# Patient Record
Sex: Female | Born: 1950
Health system: Southern US, Community
[De-identification: ages and names within clinical notes are randomized; demographics above are authoritative.]

## PROBLEM LIST (undated history)

## (undated) DIAGNOSIS — M1990 Unspecified osteoarthritis, unspecified site: Secondary | ICD-10-CM

## (undated) DIAGNOSIS — I10 Essential (primary) hypertension: Secondary | ICD-10-CM

## (undated) DIAGNOSIS — M8580 Other specified disorders of bone density and structure, unspecified site: Secondary | ICD-10-CM

## (undated) DIAGNOSIS — K219 Gastro-esophageal reflux disease without esophagitis: Secondary | ICD-10-CM

## (undated) HISTORY — DX: Other specified disorders of bone density and structure, unspecified site: M85.80

## (undated) HISTORY — PX: OTHER SURGICAL HISTORY: SHX169

## (undated) HISTORY — DX: Gastro-esophageal reflux disease without esophagitis: K21.9

## (undated) HISTORY — PX: BACK SURGERY: SHX140

---

## 1991-04-10 HISTORY — PX: ABDOMINAL HYSTERECTOMY: SHX81

## 1998-05-31 ENCOUNTER — Encounter: Payer: Self-pay | Admitting: Emergency Medicine

## 1998-05-31 ENCOUNTER — Emergency Department (HOSPITAL_COMMUNITY): Admission: EM | Admit: 1998-05-31 | Discharge: 1998-05-31 | Payer: Self-pay | Admitting: Emergency Medicine

## 1998-12-23 ENCOUNTER — Encounter: Payer: Self-pay | Admitting: Neurosurgery

## 1998-12-27 ENCOUNTER — Encounter: Payer: Self-pay | Admitting: Neurosurgery

## 1998-12-27 ENCOUNTER — Observation Stay (HOSPITAL_COMMUNITY): Admission: RE | Admit: 1998-12-27 | Discharge: 1998-12-28 | Payer: Self-pay | Admitting: Neurosurgery

## 1999-06-26 ENCOUNTER — Encounter: Payer: Self-pay | Admitting: *Deleted

## 1999-06-26 ENCOUNTER — Ambulatory Visit (HOSPITAL_COMMUNITY): Admission: RE | Admit: 1999-06-26 | Discharge: 1999-06-26 | Payer: Self-pay | Admitting: *Deleted

## 1999-10-25 ENCOUNTER — Other Ambulatory Visit: Admission: RE | Admit: 1999-10-25 | Discharge: 1999-10-25 | Payer: Self-pay | Admitting: Obstetrics and Gynecology

## 2000-04-09 HISTORY — PX: BACK SURGERY: SHX140

## 2000-11-26 ENCOUNTER — Other Ambulatory Visit: Admission: RE | Admit: 2000-11-26 | Discharge: 2000-11-26 | Payer: Self-pay | Admitting: Obstetrics and Gynecology

## 2001-10-24 ENCOUNTER — Ambulatory Visit (HOSPITAL_COMMUNITY): Admission: RE | Admit: 2001-10-24 | Discharge: 2001-10-24 | Payer: Self-pay | Admitting: *Deleted

## 2001-10-24 ENCOUNTER — Encounter: Payer: Self-pay | Admitting: *Deleted

## 2001-10-26 ENCOUNTER — Ambulatory Visit (HOSPITAL_COMMUNITY): Admission: RE | Admit: 2001-10-26 | Discharge: 2001-10-26 | Payer: Self-pay | Admitting: *Deleted

## 2001-10-26 ENCOUNTER — Encounter: Payer: Self-pay | Admitting: *Deleted

## 2003-08-25 ENCOUNTER — Emergency Department (HOSPITAL_COMMUNITY): Admission: EM | Admit: 2003-08-25 | Discharge: 2003-08-25 | Payer: Self-pay | Admitting: Emergency Medicine

## 2003-08-30 ENCOUNTER — Ambulatory Visit (HOSPITAL_BASED_OUTPATIENT_CLINIC_OR_DEPARTMENT_OTHER): Admission: RE | Admit: 2003-08-30 | Discharge: 2003-08-30 | Payer: Self-pay | Admitting: *Deleted

## 2003-08-30 ENCOUNTER — Ambulatory Visit (HOSPITAL_COMMUNITY): Admission: RE | Admit: 2003-08-30 | Discharge: 2003-08-30 | Payer: Self-pay | Admitting: *Deleted

## 2007-05-16 ENCOUNTER — Ambulatory Visit: Payer: Self-pay | Admitting: Gastroenterology

## 2007-05-30 ENCOUNTER — Encounter: Payer: Self-pay | Admitting: Gastroenterology

## 2007-05-30 ENCOUNTER — Ambulatory Visit: Payer: Self-pay | Admitting: Gastroenterology

## 2007-05-30 LAB — HM COLONOSCOPY

## 2009-10-28 ENCOUNTER — Encounter: Admission: RE | Admit: 2009-10-28 | Discharge: 2009-10-28 | Payer: Self-pay | Admitting: Internal Medicine

## 2010-08-25 NOTE — Op Note (Signed)
NAME:  Candace Bell, HIPOLITO NO.:  [id number]   MEDICAL RECORD NO.:  [id number]                   PATIENT TYPE:  AMB   LOCATION:  DSC                                  FACILITY:  [facility]   PHYSICIAN:  Lowell Bouton, M.D.      DATE OF BIRTH:  [birth year]   DATE OF PROCEDURE:  08/30/2003  DATE OF DISCHARGE:                                 OPERATIVE REPORT   PREOPERATIVE DIAGNOSIS:  Fracture of right distal radius and ulna.   POSTOPERATIVE DIAGNOSIS:  Fracture of right distal radius and ulna.   OPERATION PERFORMED:  Open reduction internal fixation of right wrist.   SURGEON:  Lowell Bouton, M.D.   ANESTHESIA:  General.   INDICATIONS FOR PROCEDURE:  The patient has a comminuted distal radius  fracture with dorsal angulation.   DESCRIPTION OF PROCEDURE:  Under general anesthesia with a tourniquet on the  right arm, the right hand was prepped and draped in the usual sterile  fashion.  After exsanguinating the limb, the tourniquet was inflated to 250  mmHg.  A longitudinal incision was made along the FCR tendon, volarly on the  right wrist.  Sharp dissection was carried down through the subcutaneous  tissues and bleeding points were coagulated.  Sharp dissection was carried  into the FCR tendon sheath and the tendon was retracted ulnarly.  The floor  of the sheath was opened with the scissors and blunt dissection was carried  down to the pronator quadratus.  This was divided longitudinally and a Market researcher was used to elevate the periosteum of the distal radius.  A  Weitlaner retractor was inserted for retraction. The fracture site was then  identified.  The fingers had been placed in finger trap traction using the  index and middle fingers with 10 pounds applied across the end of the table  prior to making the incision. The fracture was then reduced with a Market researcher and the Stryker distal radius system was used.  A small distal  radius plate was then applied volarly and the sliding screw was inserted  using a 2.7 screw.  This was placed in the sliding hole on the plate and x-  ray showed good position.  The traction was then removed and an unthreaded  peg was inserted in one of the distal holes using a 2.0 mm peg.  X-ray  showed good alignment of the fracture.  Two locking screws were placed  proximally using 2.7 mm locking screw and one more nonlocking screw was  inserted proximally.  The remaining pegs were inserted distally at various  angles to support the distal fragment.  One threaded peg was used and the  rest were 2.0 nonthreaded.  X-ray showed good position of the fracture.  The  wound was then irrigated with saline.  The pronator quadratus was repaired  with a 4-0 Vicryl, subcutaneous tissue was closed with a 4-0 Vicryl over a  vessel loop  drain.  The skin was closed with a 3-0 subcuticular Prolene.  Steri-Strips were applied, followed by a sterile dressing.  0.5% Marcaine  was inserted in the wound for pain control.  The patient was placed in a  volar wrist splint.  She went to the recovery room awake and stable in good  condition.                                               Lowell Bouton, M.D.    EMM/MEDQ  D:  08/30/2003  T:  08/31/2003  Job:  [id number]

## 2012-01-10 ENCOUNTER — Emergency Department (HOSPITAL_COMMUNITY)
Admission: EM | Admit: 2012-01-10 | Discharge: 2012-01-10 | Disposition: A | Discharge status: Home / Self Care | Payer: BC Managed Care – PPO | Attending: Emergency Medicine | Admitting: Emergency Medicine

## 2012-01-10 ENCOUNTER — Encounter (HOSPITAL_COMMUNITY): Payer: Self-pay

## 2012-01-10 DIAGNOSIS — R599 Enlarged lymph nodes, unspecified: Secondary | ICD-10-CM | POA: Insufficient documentation

## 2012-01-10 DIAGNOSIS — R509 Fever, unspecified: Secondary | ICD-10-CM | POA: Insufficient documentation

## 2012-01-10 DIAGNOSIS — J029 Acute pharyngitis, unspecified: Secondary | ICD-10-CM | POA: Insufficient documentation

## 2012-01-10 DIAGNOSIS — I10 Essential (primary) hypertension: Secondary | ICD-10-CM | POA: Insufficient documentation

## 2012-01-10 DIAGNOSIS — R51 Headache: Secondary | ICD-10-CM | POA: Insufficient documentation

## 2012-01-10 HISTORY — DX: Unspecified osteoarthritis, unspecified site: M19.90

## 2012-01-10 HISTORY — DX: Essential (primary) hypertension: I10

## 2012-01-10 LAB — CBC WITH DIFFERENTIAL/PLATELET
Basophils Absolute: 0 10*3/uL (ref 0.0–0.1)
Eosinophils Absolute: 0 10*3/uL (ref 0.0–0.7)
Eosinophils Relative: 0 % (ref 0–5)
MCH: 28.5 pg (ref 26.0–34.0)
MCV: 81.6 fL (ref 78.0–100.0)
Platelets: 186 10*3/uL (ref 150–400)
RDW: 13.7 % (ref 11.5–15.5)
WBC: 4.1 10*3/uL (ref 4.0–10.5)

## 2012-01-10 LAB — URINALYSIS, ROUTINE W REFLEX MICROSCOPIC
Nitrite: NEGATIVE
Protein, ur: NEGATIVE mg/dL

## 2012-01-10 MED ORDER — AZITHROMYCIN 250 MG PO TABS: ORAL_TABLET | ORAL | Status: DC

## 2012-01-10 MED ORDER — AZITHROMYCIN 250 MG PO TABS
500.0000 mg | ORAL_TABLET | Freq: Once | ORAL | Status: AC
  Administered 2012-01-10: 500 mg via ORAL
  Filled 2012-01-10: qty 2

## 2012-01-10 MED ORDER — HYDROCODONE-ACETAMINOPHEN 7.5-500 MG/15ML PO SOLN: ORAL | Status: DC

## 2012-01-10 MED ORDER — IBUPROFEN 100 MG/5ML PO SUSP
400.0000 mg | Freq: Once | ORAL | Status: AC
  Administered 2012-01-10: 400 mg via ORAL
  Filled 2012-01-10: qty 20

## 2012-01-10 NOTE — ED Provider Notes (Addendum)
History     CSN: [id number]  Arrival date & time 01/10/12  1415   First MD Initiated Contact with Patient 01/10/12 1538      Chief Complaint  Patient presents with  . Fever    (Consider location/radiation/quality/duration/timing/severity/associated sxs/prior treatment) Patient is a 61 y.o. female presenting with fever. The history is provided by the patient and a relative.  Fever Primary symptoms of the febrile illness include fever and headaches. Primary symptoms do not include cough, shortness of breath, abdominal pain, nausea, vomiting, dysuria, altered mental status or rash.   stick 56-year-old, female, with a history of arthritis, and hypertension, presents to emergency department complaining of fever, sore throat, and headache for the past several days.  She has also had malaise, weakness.  She was seen by her physician 2 days ago.  Rapid strep test was performed.  It was negative.  The patient.  Denies nausea, vomiting, rash, neck pain.  She denies cough, or shortness of breath.  She denies dysuria, or hematuria.  Past Medical History  Diagnosis Date  . Arthritis   . Hypertension     Past Surgical History  Procedure Date  . Back surgery     No family history on file.  History  Substance Use Topics  . Smoking status: Not on file  . Smokeless tobacco: Not on file  . Alcohol Use: No    OB History    Grav Para Term Preterm Abortions TAB SAB Ect Mult Living                  Review of Systems  Constitutional: Positive for fever and chills. Negative for diaphoresis.  HENT: Negative for neck pain.   Respiratory: Negative for cough and shortness of breath.   Cardiovascular: Negative for chest pain.  Gastrointestinal: Negative for nausea, vomiting and abdominal pain.  Genitourinary: Negative for dysuria.  Skin: Negative for rash.  Neurological: Positive for headaches.  Psychiatric/Behavioral: Negative for confusion and altered mental status.    Allergies    Amoxicillin and Penicillins  Home Medications  No current outpatient prescriptions on file.  BP 120/68  Pulse 112  Temp 102.2 F (39 C) (Oral)  Resp 24  SpO2 100%  Physical Exam  Nursing note and vitals reviewed. Constitutional: She is oriented to person, place, and time. She appears well-developed and well-nourished. No distress.  HENT:  Head: Normocephalic and atraumatic.  Mouth/Throat: Oropharyngeal exudate present.       OP is erythematous with purulence on the left tonsillar pillar  Eyes: Conjunctivae normal and EOM are normal.  Neck: Normal range of motion. Neck supple.  Pulmonary/Chest: Effort normal and breath sounds normal. No respiratory distress.  Abdominal: Soft. Bowel sounds are normal. She exhibits no distension.  Musculoskeletal: Normal range of motion.  Lymphadenopathy:    She has cervical adenopathy.  Neurological: She is alert and oriented to person, place, and time. No cranial nerve deficit.  Skin: Skin is warm and dry.  Psychiatric: She has a normal mood and affect. Judgment and thought content normal.    ED Course  Procedures (including critical care time) fever, sore throat, oral, erythema, and 30 lymphs, and lymphadenopathy.  No cough.  Suspect strep pharyngitis.  We'll treat her fever and pain with ibuprofen and perform laboratory testing, for further evaluation   Labs Reviewed  CBC WITH DIFFERENTIAL  URINALYSIS, ROUTINE W REFLEX MICROSCOPIC  RAPID STREP SCREEN   No results found.   No diagnosis found.  Temp and pain improved.  Not toxic.  MDM  Exudative pharyngitis fever        Cheri Guppy, MD 01/10/12 1552  Cheri Guppy, MD 01/10/12 1941  Cheri Guppy, MD 01/11/12 0002

## 2012-01-10 NOTE — Progress Notes (Signed)
Pt and daughters states pt will be seeing Lelon Perla, DO in a few weeks

## 2012-01-10 NOTE — ED Notes (Signed)
Pt reports sores to mouth starting Sunday and starting "feeling bad" on Monday and started running a fever today. Reports not being able to eat well and has lost 8 lbs since Sunday. NAD noted at this time.

## 2012-01-10 NOTE — ED Notes (Signed)
Pt reports having sores

## 2012-01-10 NOTE — ED Notes (Signed)
Pt present to Ed with C/o fever and "feeling bad."  Pt went to PCP, Dr Clarene Duke at [facility] on Monday.  Pt was tested for strep, still awaiting for results.  Pt was tested for Herpes in mouth by dr Zachery Dauer in July and test came back negative.  Pt current symptoms are sores in mouth, fever, fatigue and dizziness.  Pt denies drainage to sores.  Pt reports feeling sores in throat now.  Per pt, sores was not in throat when the flare up occurred in July.

## 2012-01-12 ENCOUNTER — Encounter (HOSPITAL_COMMUNITY): Payer: Self-pay | Admitting: Emergency Medicine

## 2012-01-12 ENCOUNTER — Emergency Department (HOSPITAL_COMMUNITY)
Admission: EM | Admit: 2012-01-12 | Discharge: 2012-01-12 | Disposition: A | Discharge status: Home / Self Care | Payer: BC Managed Care – PPO | Attending: Emergency Medicine | Admitting: Emergency Medicine

## 2012-01-12 DIAGNOSIS — E876 Hypokalemia: Secondary | ICD-10-CM

## 2012-01-12 DIAGNOSIS — N289 Disorder of kidney and ureter, unspecified: Secondary | ICD-10-CM | POA: Insufficient documentation

## 2012-01-12 DIAGNOSIS — E860 Dehydration: Secondary | ICD-10-CM

## 2012-01-12 DIAGNOSIS — M129 Arthropathy, unspecified: Secondary | ICD-10-CM | POA: Insufficient documentation

## 2012-01-12 DIAGNOSIS — I10 Essential (primary) hypertension: Secondary | ICD-10-CM | POA: Insufficient documentation

## 2012-01-12 DIAGNOSIS — Z880 Allergy status to penicillin: Secondary | ICD-10-CM | POA: Insufficient documentation

## 2012-01-12 DIAGNOSIS — R0602 Shortness of breath: Secondary | ICD-10-CM | POA: Insufficient documentation

## 2012-01-12 DIAGNOSIS — B085 Enteroviral vesicular pharyngitis: Secondary | ICD-10-CM

## 2012-01-12 LAB — CBC WITH DIFFERENTIAL/PLATELET
Basophils Relative: 2 % — ABNORMAL HIGH (ref 0–1)
Eosinophils Absolute: 0 10*3/uL (ref 0.0–0.7)
HCT: 41.6 % (ref 36.0–46.0)
Hemoglobin: 14.5 g/dL (ref 12.0–15.0)
MCH: 28.2 pg (ref 26.0–34.0)
MCHC: 34.9 g/dL (ref 30.0–36.0)
Monocytes Absolute: 0.2 10*3/uL (ref 0.1–1.0)
Monocytes Relative: 5 % (ref 3–12)
Neutro Abs: 3.2 10*3/uL (ref 1.7–7.7)

## 2012-01-12 LAB — COMPREHENSIVE METABOLIC PANEL
Albumin: 3.7 g/dL (ref 3.5–5.2)
BUN: 39 mg/dL — ABNORMAL HIGH (ref 6–23)
Chloride: 97 mEq/L (ref 96–112)
Creatinine, Ser: 1.67 mg/dL — ABNORMAL HIGH (ref 0.50–1.10)
GFR calc Af Amer: 37 mL/min — ABNORMAL LOW (ref >90)
Total Bilirubin: 0.5 mg/dL (ref 0.3–1.2)

## 2012-01-12 LAB — LACTIC ACID, PLASMA: Lactic Acid, Venous: 2.2 mmol/L (ref 0.5–2.2)

## 2012-01-12 MED ORDER — SODIUM CHLORIDE 0.9 % IV SOLN
INTRAVENOUS | Status: DC
  Administered 2012-01-12: 17:00:00 via INTRAVENOUS

## 2012-01-12 MED ORDER — POTASSIUM CHLORIDE CRYS ER 20 MEQ PO TBCR
40.0000 meq | EXTENDED_RELEASE_TABLET | Freq: Once | ORAL | Status: AC
  Administered 2012-01-12: 40 meq via ORAL
  Filled 2012-01-12: qty 2

## 2012-01-12 MED ORDER — HYDROMORPHONE HCL PF 1 MG/ML IJ SOLN
1.0000 mg | Freq: Once | INTRAMUSCULAR | Status: AC
  Administered 2012-01-12: 1 mg via INTRAVENOUS
  Filled 2012-01-12: qty 1

## 2012-01-12 MED ORDER — SODIUM CHLORIDE 0.9 % IV BOLUS (SEPSIS)
2000.0000 mL | Freq: Once | INTRAVENOUS | Status: AC
  Administered 2012-01-12: 1000 mL via INTRAVENOUS

## 2012-01-12 MED ORDER — SODIUM CHLORIDE 0.9 % IV BOLUS (SEPSIS)
500.0000 mL | Freq: Once | INTRAVENOUS | Status: AC
  Administered 2012-01-12: 500 mL via INTRAVENOUS

## 2012-01-12 MED ORDER — HYDROCODONE-ACETAMINOPHEN 5-325 MG PO TABS: 2.0000 | ORAL_TABLET | ORAL | Status: DC | PRN

## 2012-01-12 NOTE — ED Provider Notes (Signed)
History   This chart was scribed for Hurman Horn, MD by Melba Coon. The patient was seen in room TR06C/TR06C and the patient's care was started at 3:13PM.    CSN: [id number]  Arrival date & time 01/12/12  1317   None     Chief Complaint  Patient presents with  . Sore Throat  . Shortness of Breath    (Consider location/radiation/quality/duration/timing/severity/associated sxs/prior treatment) The history is provided by the patient. No language interpreter was used.   Candace Bell is a 61 y.o. female who presents to the Emergency Department complaining of persistent, moderate to severe sore throat and shortness of breath with an onset 6 days ago. She has been seen at [facility] and [facility] and all labs have been negative. Family also reports pt had a fever of 102 for the past 2 nights. Decreased appetite and fluid intake. Reports losing 10 lbs in the last month. Reports diarrhea, generalized weakness, chills, sores in her mouth (had them in July but came back recently), and fatigue. Denies cough, HA, neck pain, rash, back pain, CP, abd pain, nausea, vomit, dysuria, or extremity pain, edema, weakness, numbness, or tingling. No other pertinent medical symptoms.   Past Medical History  Diagnosis Date  . Arthritis   . Hypertension     Past Surgical History  Procedure Date  . Back surgery   . Abdominal hysterectomy     No family history on file.  History  Substance Use Topics  . Smoking status: Never Smoker   . Smokeless tobacco: Not on file  . Alcohol Use: No    OB History    Grav Para Term Preterm Abortions TAB SAB Ect Mult Living                  Review of Systems 10 Systems reviewed and all are negative for acute change except as noted in the HPI.   Allergies  Amoxicillin; Other; and Penicillins  Home Medications   Current Outpatient Rx  Name Route Sig Dispense Refill  . AZITHROMYCIN 250 MG PO TABS Oral Take 250 mg by mouth daily. For 4  days Started 10/4    . IBUPROFEN 800 MG PO TABS Oral Take 800 mg by mouth every 8 (eight) hours as needed. For pain    . MELOXICAM 15 MG PO TABS Oral Take 15 mg by mouth daily.    Marland Kitchen PRESCRIPTION MEDICATION Oral Take 5 mLs by mouth every 8 (eight) hours as needed. For pain  Hydrocodone/acetaminophen solution 7.5-325mg /48ml    . TRIAMCINOLONE ACETONIDE 0.1 % MT PSTE dental Place 1 application onto teeth 2 (two) times daily. To tongue    . VALSARTAN-HYDROCHLOROTHIAZIDE 160-25 MG PO TABS Oral Take 1 tablet by mouth daily.    Marland Kitchen HYDROCODONE-ACETAMINOPHEN 5-325 MG PO TABS Oral Take 2 tablets by mouth every 4 (four) hours as needed for pain. 20 tablet 0    BP 95/66  Pulse 106  Temp 98.9 F (37.2 C) (Oral)  Resp 18  SpO2 99%  Physical Exam  Nursing note and vitals reviewed. Constitutional:       Awake, alert, nontoxic appearance with baseline speech for patient.  HENT:  Head: Normocephalic and atraumatic.  Mouth/Throat: No oropharyngeal exudate.       Dry mouth with superficial 1cm ulcers to the tongue, buccal mucosa, oropharynx and lips  Eyes: EOM are normal. Pupils are equal, round, and reactive to light. Right eye exhibits no discharge. Left eye exhibits no discharge.  Neck: Neck supple.  Cardiovascular: Regular rhythm.  Tachycardia present.   No murmur heard. Pulmonary/Chest: Effort normal and breath sounds normal. No stridor. No respiratory distress. She has no wheezes. She has no rales. She exhibits no tenderness.       Clear lung sounds bilaterally.  Abdominal: Soft. Bowel sounds are normal. She exhibits no mass. There is no tenderness. There is no rebound.  Musculoskeletal: She exhibits no tenderness.       Baseline ROM, moves extremities with no obvious new focal weakness.  Lymphadenopathy:    She has no cervical adenopathy.  Neurological:       Awake, alert, cooperative and aware of situation; motor strength bilaterally; sensation normal to light touch bilaterally; peripheral  visual fields full to confrontation; no facial asymmetry; tongue midline; major cranial nerves appear intact; no pronator drift, normal finger to nose bilaterally, baseline gait without new ataxia.  Skin: No rash noted.  Psychiatric: She has a normal mood and affect.    ED Course  Procedures (including critical care time)   COORDINATION OF CARE:  3:18PM - strep test was run at [facility] and was negative. Blood w/u will be ordered. IV fluids and pain meds will be given here at the ED and will be transferred to CDU. 5:45PM - recheck; Mrs Rechner condition is much improved and she is ready to go home.  She will be given norco for home, is advised to f/u with PCP, and is ready for d/c.   Labs Reviewed  CBC WITH DIFFERENTIAL - Abnormal; Notable for the following:    RBC 5.14 (*)     Basophils Relative 2 (*)     All other components within normal limits  COMPREHENSIVE METABOLIC PANEL - Abnormal; Notable for the following:    Sodium 133 (*)     Potassium 3.0 (*)     Glucose, Bld 168 (*)     BUN 39 (*)     Creatinine, Ser 1.67 (*)     AST 49 (*)     ALT 36 (*)     GFR calc non Af Amer 32 (*)     GFR calc Af Amer 37 (*)     All other components within normal limits  LACTIC ACID, PLASMA  LAB REPORT - SCANNED   No results found.   1. Herpangina   2. Dehydration   3. Renal insufficiency   4. Hypokalemia       MDM   Patient / Family / Caregiver informed of clinical course, understand medical decision-making process, and agree with plan. I personally performed the services described in this documentation, which was scribed in my presence. The recorded information has been reviewed and considered. I doubt any other EMC precluding discharge at this time including, but not necessarily limited to the following:sepsis.       Hurman Horn, MD 01/14/12 [phone]

## 2012-01-12 NOTE — ED Notes (Signed)
Pt continues to c/o sore throat and shortness of breath since Monday. Pt seen at [facility] physicians and [place], had tests done but everything is coming back negative. Visitor reports pt had fever 102 last 2 nights.

## 2012-01-12 NOTE — ED Notes (Signed)
Patient discharged with her family using teach back method. Patient and family verbalize an understanding

## 2012-02-29 ENCOUNTER — Ambulatory Visit (INDEPENDENT_AMBULATORY_CARE_PROVIDER_SITE_OTHER): Payer: BC Managed Care – PPO | Admitting: Family Medicine

## 2012-02-29 ENCOUNTER — Encounter: Payer: Self-pay | Admitting: Family Medicine

## 2012-02-29 VITALS — BP 136/82 | HR 86 | Temp 98.1°F | Ht 65.5 in | Wt 166.6 lb

## 2012-02-29 DIAGNOSIS — Z0000 Encounter for general adult medical examination without abnormal findings: Secondary | ICD-10-CM

## 2012-02-29 DIAGNOSIS — Z780 Asymptomatic menopausal state: Secondary | ICD-10-CM

## 2012-02-29 DIAGNOSIS — M179 Osteoarthritis of knee, unspecified: Secondary | ICD-10-CM

## 2012-02-29 DIAGNOSIS — Z1239 Encounter for other screening for malignant neoplasm of breast: Secondary | ICD-10-CM

## 2012-02-29 DIAGNOSIS — I10 Essential (primary) hypertension: Secondary | ICD-10-CM

## 2012-02-29 DIAGNOSIS — M1710 Unilateral primary osteoarthritis, unspecified knee: Secondary | ICD-10-CM

## 2012-02-29 DIAGNOSIS — IMO0002 Reserved for concepts with insufficient information to code with codable children: Secondary | ICD-10-CM

## 2012-02-29 DIAGNOSIS — Z23 Encounter for immunization: Secondary | ICD-10-CM

## 2012-02-29 MED ORDER — MELOXICAM 15 MG PO TABS
15.0000 mg | ORAL_TABLET | Freq: Every day | ORAL | Status: DC
Start: 2012-02-29 — End: 2012-08-25

## 2012-02-29 NOTE — Assessment & Plan Note (Signed)
Renew meds 

## 2012-02-29 NOTE — Patient Instructions (Addendum)
Preventive Care for Adults, Female A healthy lifestyle and preventive care can promote health and wellness. Preventive health guidelines for women include the following key practices.  A routine yearly physical is a good way to check with your caregiver about your health and preventive screening. It is a chance to share any concerns and updates on your health, and to receive a thorough exam.  Visit your dentist for a routine exam and preventive care every 6 months. Brush your teeth twice a day and floss once a day. Good oral hygiene prevents tooth decay and gum disease.  The frequency of eye exams is based on your age, health, family medical history, use of contact lenses, and other factors. Follow your caregiver's recommendations for frequency of eye exams.  Eat a healthy diet. Foods like vegetables, fruits, whole grains, low-fat dairy products, and lean protein foods contain the nutrients you need without too many calories. Decrease your intake of foods high in solid fats, added sugars, and salt. Eat the right amount of calories for you.Get information about a proper diet from your caregiver, if necessary.  Regular physical exercise is one of the most important things you can do for your health. Most adults should get at least 150 minutes of moderate-intensity exercise (any activity that increases your heart rate and causes you to sweat) each week. In addition, most adults need muscle-strengthening exercises on 2 or more days a week.  Maintain a healthy weight. The body mass index (BMI) is a screening tool to identify possible weight problems. It provides an estimate of body fat based on height and weight. Your caregiver can help determine your BMI, and can help you achieve or maintain a healthy weight.For adults 20 years and older:  A BMI below 18.5 is considered underweight.  A BMI of 18.5 to 24.9 is normal.  A BMI of 25 to 29.9 is considered overweight.  A BMI of 30 and above is  considered obese.  Maintain normal blood lipids and cholesterol levels by exercising and minimizing your intake of saturated fat. Eat a balanced diet with plenty of fruit and vegetables. Blood tests for lipids and cholesterol should begin at age 20 and be repeated every 5 years. If your lipid or cholesterol levels are high, you are over 50, or you are at high risk for heart disease, you may need your cholesterol levels checked more frequently.Ongoing high lipid and cholesterol levels should be treated with medicines if diet and exercise are not effective.  If you smoke, find out from your caregiver how to quit. If you do not use tobacco, do not start.  If you are pregnant, do not drink alcohol. If you are breastfeeding, be very cautious about drinking alcohol. If you are not pregnant and choose to drink alcohol, do not exceed 1 drink per day. One drink is considered to be 12 ounces (355 mL) of beer, 5 ounces (148 mL) of wine, or 1.5 ounces (44 mL) of liquor.  Avoid use of street drugs. Do not share needles with anyone. Ask for help if you need support or instructions about stopping the use of drugs.  High blood pressure causes heart disease and increases the risk of stroke. Your blood pressure should be checked at least every 1 to 2 years. Ongoing high blood pressure should be treated with medicines if weight loss and exercise are not effective.  If you are 55 to 63 years old, ask your caregiver if you should take aspirin to prevent strokes.  Diabetes   screening involves taking a blood sample to check your fasting blood sugar level. This should be done once every 3 years, after age 45, if you are within normal weight and without risk factors for diabetes. Testing should be considered at a younger age or be carried out more frequently if you are overweight and have at least 1 risk factor for diabetes.  Breast cancer screening is essential preventive care for women. You should practice "breast  self-awareness." This means understanding the normal appearance and feel of your breasts and may include breast self-examination. Any changes detected, no matter how small, should be reported to a caregiver. Women in their 20s and 30s should have a clinical breast exam (CBE) by a caregiver as part of a regular health exam every 1 to 3 years. After age 40, women should have a CBE every year. Starting at age 40, women should consider having a mammography (breast X-ray test) every year. Women who have a family history of breast cancer should talk to their caregiver about genetic screening. Women at a high risk of breast cancer should talk to their caregivers about having magnetic resonance imaging (MRI) and a mammography every year.  The Pap test is a screening test for cervical cancer. A Pap test can show cell changes on the cervix that might become cervical cancer if left untreated. A Pap test is a procedure in which cells are obtained and examined from the lower end of the uterus (cervix).  Women should have a Pap test starting at age 21.  Between ages 21 and 29, Pap tests should be repeated every 2 years.  Beginning at age 30, you should have a Pap test every 3 years as long as the past 3 Pap tests have been normal.  Some women have medical problems that increase the chance of getting cervical cancer. Talk to your caregiver about these problems. It is especially important to talk to your caregiver if a new problem develops soon after your last Pap test. In these cases, your caregiver may recommend more frequent screening and Pap tests.  The above recommendations are the same for women who have or have not gotten the vaccine for human papillomavirus (HPV).  If you had a hysterectomy for a problem that was not cancer or a condition that could lead to cancer, then you no longer need Pap tests. Even if you no longer need a Pap test, a regular exam is a good idea to make sure no other problems are  starting.  If you are between ages 65 and 70, and you have had normal Pap tests going back 10 years, you no longer need Pap tests. Even if you no longer need a Pap test, a regular exam is a good idea to make sure no other problems are starting.  If you have had past treatment for cervical cancer or a condition that could lead to cancer, you need Pap tests and screening for cancer for at least 20 years after your treatment.  If Pap tests have been discontinued, risk factors (such as a new sexual partner) need to be reassessed to determine if screening should be resumed.  The HPV test is an additional test that may be used for cervical cancer screening. The HPV test looks for the virus that can cause the cell changes on the cervix. The cells collected during the Pap test can be tested for HPV. The HPV test could be used to screen women aged 30 years and older, and should   be used in women of any age who have unclear Pap test results. After the age of 30, women should have HPV testing at the same frequency as a Pap test.  Colorectal cancer can be detected and often prevented. Most routine colorectal cancer screening begins at the age of 50 and continues through age 75. However, your caregiver may recommend screening at an earlier age if you have risk factors for colon cancer. On a yearly basis, your caregiver may provide home test kits to check for hidden blood in the stool. Use of a small camera at the end of a tube, to directly examine the colon (sigmoidoscopy or colonoscopy), can detect the earliest forms of colorectal cancer. Talk to your caregiver about this at age 50, when routine screening begins. Direct examination of the colon should be repeated every 5 to 10 years through age 75, unless early forms of pre-cancerous polyps or small growths are found.  Hepatitis C blood testing is recommended for all people born from 1945 through 1965 and any individual with known risks for hepatitis C.  Practice  safe sex. Use condoms and avoid high-risk sexual practices to reduce the spread of sexually transmitted infections (STIs). STIs include gonorrhea, chlamydia, syphilis, trichomonas, herpes, HPV, and human immunodeficiency virus (HIV). Herpes, HIV, and HPV are viral illnesses that have no cure. They can result in disability, cancer, and death. Sexually active women aged 25 and younger should be checked for chlamydia. Older women with new or multiple partners should also be tested for chlamydia. Testing for other STIs is recommended if you are sexually active and at increased risk.  Osteoporosis is a disease in which the bones lose minerals and strength with aging. This can result in serious bone fractures. The risk of osteoporosis can be identified using a bone density scan. Women ages 65 and over and women at risk for fractures or osteoporosis should discuss screening with their caregivers. Ask your caregiver whether you should take a calcium supplement or vitamin D to reduce the rate of osteoporosis.  Menopause can be associated with physical symptoms and risks. Hormone replacement therapy is available to decrease symptoms and risks. You should talk to your caregiver about whether hormone replacement therapy is right for you.  Use sunscreen with sun protection factor (SPF) of 30 or more. Apply sunscreen liberally and repeatedly throughout the day. You should seek shade when your shadow is shorter than you. Protect yourself by wearing long sleeves, pants, a wide-brimmed hat, and sunglasses year round, whenever you are outdoors.  Once a month, do a whole body skin exam, using a mirror to look at the skin on your back. Notify your caregiver of new moles, moles that have irregular borders, moles that are larger than a pencil eraser, or moles that have changed in shape or color.  Stay current with required immunizations.  Influenza. You need a dose every fall (or winter). The composition of the flu vaccine  changes each year, so being vaccinated once is not enough.  Pneumococcal polysaccharide. You need 1 to 2 doses if you smoke cigarettes or if you have certain chronic medical conditions. You need 1 dose at age 65 (or older) if you have never been vaccinated.  Tetanus, diphtheria, pertussis (Tdap, Td). Get 1 dose of Tdap vaccine if you are younger than age 65, are over 65 and have contact with an infant, are a healthcare worker, are pregnant, or simply want to be protected from whooping cough. After that, you need a Td   booster dose every 10 years. Consult your caregiver if you have not had at least 3 tetanus and diphtheria-containing shots sometime in your life or have a deep or dirty wound.  HPV. You need this vaccine if you are a woman age 26 or younger. The vaccine is given in 3 doses over 6 months.  Measles, mumps, rubella (MMR). You need at least 1 dose of MMR if you were born in 1957 or later. You may also need a second dose.  Meningococcal. If you are age 19 to 21 and a first-year college student living in a residence hall, or have one of several medical conditions, you need to get vaccinated against meningococcal disease. You may also need additional booster doses.  Zoster (shingles). If you are age 60 or older, you should get this vaccine.  Varicella (chickenpox). If you have never had chickenpox or you were vaccinated but received only 1 dose, talk to your caregiver to find out if you need this vaccine.  Hepatitis A. You need this vaccine if you have a specific risk factor for hepatitis A virus infection or you simply wish to be protected from this disease. The vaccine is usually given as 2 doses, 6 to 18 months apart.  Hepatitis B. You need this vaccine if you have a specific risk factor for hepatitis B virus infection or you simply wish to be protected from this disease. The vaccine is given in 3 doses, usually over 6 months. Preventive Services / Frequency Ages 19 to 39  Blood  pressure check.** / Every 1 to 2 years.  Lipid and cholesterol check.** / Every 5 years beginning at age 20.  Clinical breast exam.** / Every 3 years for women in their 20s and 30s.  Pap test.** / Every 2 years from ages 21 through 29. Every 3 years starting at age 30 through age 65 or 70 with a history of 3 consecutive normal Pap tests.  HPV screening.** / Every 3 years from ages 30 through ages 65 to 70 with a history of 3 consecutive normal Pap tests.  Hepatitis C blood test.** / For any individual with known risks for hepatitis C.  Skin self-exam. / Monthly.  Influenza immunization.** / Every year.  Pneumococcal polysaccharide immunization.** / 1 to 2 doses if you smoke cigarettes or if you have certain chronic medical conditions.  Tetanus, diphtheria, pertussis (Tdap, Td) immunization. / A one-time dose of Tdap vaccine. After that, you need a Td booster dose every 10 years.  HPV immunization. / 3 doses over 6 months, if you are 26 and younger.  Measles, mumps, rubella (MMR) immunization. / You need at least 1 dose of MMR if you were born in 1957 or later. You may also need a second dose.  Meningococcal immunization. / 1 dose if you are age 19 to 21 and a first-year college student living in a residence hall, or have one of several medical conditions, you need to get vaccinated against meningococcal disease. You may also need additional booster doses.  Varicella immunization.** / Consult your caregiver.  Hepatitis A immunization.** / Consult your caregiver. 2 doses, 6 to 18 months apart.  Hepatitis B immunization.** / Consult your caregiver. 3 doses usually over 6 months. Ages 40 to 64  Blood pressure check.** / Every 1 to 2 years.  Lipid and cholesterol check.** / Every 5 years beginning at age 20.  Clinical breast exam.** / Every year after age 40.  Mammogram.** / Every year beginning at age 40   and continuing for as long as you are in good health. Consult with your  caregiver.  Pap test.** / Every 3 years starting at age 30 through age 65 or 70 with a history of 3 consecutive normal Pap tests.  HPV screening.** / Every 3 years from ages 30 through ages 65 to 70 with a history of 3 consecutive normal Pap tests.  Fecal occult blood test (FOBT) of stool. / Every year beginning at age 50 and continuing until age 75. You may not need to do this test if you get a colonoscopy every 10 years.  Flexible sigmoidoscopy or colonoscopy.** / Every 5 years for a flexible sigmoidoscopy or every 10 years for a colonoscopy beginning at age 50 and continuing until age 75.  Hepatitis C blood test.** / For all people born from 1945 through 1965 and any individual with known risks for hepatitis C.  Skin self-exam. / Monthly.  Influenza immunization.** / Every year.  Pneumococcal polysaccharide immunization.** / 1 to 2 doses if you smoke cigarettes or if you have certain chronic medical conditions.  Tetanus, diphtheria, pertussis (Tdap, Td) immunization.** / A one-time dose of Tdap vaccine. After that, you need a Td booster dose every 10 years.  Measles, mumps, rubella (MMR) immunization. / You need at least 1 dose of MMR if you were born in 1957 or later. You may also need a second dose.  Varicella immunization.** / Consult your caregiver.  Meningococcal immunization.** / Consult your caregiver.  Hepatitis A immunization.** / Consult your caregiver. 2 doses, 6 to 18 months apart.  Hepatitis B immunization.** / Consult your caregiver. 3 doses, usually over 6 months. Ages 65 and over  Blood pressure check.** / Every 1 to 2 years.  Lipid and cholesterol check.** / Every 5 years beginning at age 20.  Clinical breast exam.** / Every year after age 40.  Mammogram.** / Every year beginning at age 40 and continuing for as long as you are in good health. Consult with your caregiver.  Pap test.** / Every 3 years starting at age 30 through age 65 or 70 with a 3  consecutive normal Pap tests. Testing can be stopped between 65 and 70 with 3 consecutive normal Pap tests and no abnormal Pap or HPV tests in the past 10 years.  HPV screening.** / Every 3 years from ages 30 through ages 65 or 70 with a history of 3 consecutive normal Pap tests. Testing can be stopped between 65 and 70 with 3 consecutive normal Pap tests and no abnormal Pap or HPV tests in the past 10 years.  Fecal occult blood test (FOBT) of stool. / Every year beginning at age 50 and continuing until age 75. You may not need to do this test if you get a colonoscopy every 10 years.  Flexible sigmoidoscopy or colonoscopy.** / Every 5 years for a flexible sigmoidoscopy or every 10 years for a colonoscopy beginning at age 50 and continuing until age 75.  Hepatitis C blood test.** / For all people born from 1945 through 1965 and any individual with known risks for hepatitis C.  Osteoporosis screening.** / A one-time screening for women ages 65 and over and women at risk for fractures or osteoporosis.  Skin self-exam. / Monthly.  Influenza immunization.** / Every year.  Pneumococcal polysaccharide immunization.** / 1 dose at age 65 (or older) if you have never been vaccinated.  Tetanus, diphtheria, pertussis (Tdap, Td) immunization. / A one-time dose of Tdap vaccine if you are over   65 and have contact with an infant, are a healthcare worker, or simply want to be protected from whooping cough. After that, you need a Td booster dose every 10 years.  Varicella immunization.** / Consult your caregiver.  Meningococcal immunization.** / Consult your caregiver.  Hepatitis A immunization.** / Consult your caregiver. 2 doses, 6 to 18 months apart.  Hepatitis B immunization.** / Check with your caregiver. 3 doses, usually over 6 months. ** Family history and personal history of risk and conditions may change your caregiver's recommendations. Document Released: 05/22/2001 Document Revised: 06/18/2011  Document Reviewed: 08/21/2010 [employer] Patient Information 2013 [employer].  

## 2012-02-29 NOTE — Progress Notes (Signed)
Subjective:     Candace Bell is a 61 y.o. female and is here for a comprehensive physical exam. The patient reports no problems.  History   Social History  . Marital Status: Divorced    Spouse Name: N/A    Number of Children: N/A  . Years of Education: N/A   Occupational History  . Not on file.   Social History Main Topics  . Smoking status: Never Smoker   . Smokeless tobacco: Not on file  . Alcohol Use: No  . Drug Use: No  . Sexually Active:    Other Topics Concern  . Not on file   Social History Narrative  . No narrative on file   Health Maintenance  Topic Date Due  . Tetanus/tdap  05/22/1967  . Mammogram  05/21/1998  . Zostavax  05/21/2008  . Influenza Vaccine  12/08/2012  . Colonoscopy  05/29/2017    The following portions of the patient's history were reviewed and updated as appropriate:  She  has a past medical history of Arthritis and Hypertension. She  does not have any pertinent problems on file. She  has past surgical history that includes Back surgery and Abdominal hysterectomy (1993). Her family history includes Diabetes in her mother. She  reports that she has never smoked. She does not have any smokeless tobacco history on file. She reports that she does not drink alcohol or use illicit drugs. She has a current medication list which includes the following prescription(s): ibuprofen, meloxicam, valacyclovir, and valsartan-hydrochlorothiazide. Current Outpatient Prescriptions on File Prior to Visit  Medication Sig Dispense Refill  . ibuprofen (ADVIL,MOTRIN) 800 MG tablet Take 800 mg by mouth every 8 (eight) hours as needed. For pain      . valsartan-hydrochlorothiazide (DIOVAN-HCT) 160-25 MG per tablet Take 1 tablet by mouth daily.       She is allergic to amoxicillin; other; and penicillins..  Review of Systems Review of Systems  Constitutional: Negative for activity change, appetite change and fatigue.  HENT: Negative for hearing loss, congestion,  tinnitus and ear discharge.  dentist q26m Eyes: Negative for visual disturbance (see optho q1y -- vision corrected to 20/20 with glasses).  Respiratory: Negative for cough, chest tightness and shortness of breath.   Cardiovascular: Negative for chest pain, palpitations and leg swelling.  Gastrointestinal: Negative for abdominal pain, diarrhea, constipation and abdominal distention.  Genitourinary: Negative for urgency, frequency, decreased urine volume and difficulty urinating.  Musculoskeletal: Negative for back pain, arthralgias and gait problem.  Skin: Negative for color change, pallor and rash.  Neurological: Negative for dizziness, light-headedness, numbness and headaches.  Hematological: Negative for adenopathy. Does not bruise/bleed easily.  Psychiatric/Behavioral: Negative for suicidal ideas, confusion, sleep disturbance, self-injury, dysphoric mood, decreased concentration and agitation.       Objective:    BP 136/82  Pulse 86  Temp 98.1 F (36.7 C) (Oral)  Ht 5' 5.5" (1.664 m)  Wt 166 lb 9.6 oz (75.569 kg)  BMI 27.30 kg/m2  SpO2 97% General appearance: alert, cooperative, appears stated age and no distress Head: Normocephalic, without obvious abnormality, atraumatic Eyes: conjunctivae/corneas clear. PERRL, EOM's intact. Fundi benign. Ears: normal TM's and external ear canals both ears Nose: Nares normal. Septum midline. Mucosa normal. No drainage or sinus tenderness. Throat: lips, mucosa, and tongue normal; teeth and gums normal Neck: no adenopathy, no carotid bruit, supple, symmetrical, trachea midline and thyroid not enlarged, symmetric, no tenderness/mass/nodules Back: symmetric, no curvature. ROM normal. No CVA tenderness. Lungs: clear to auscultation bilaterally  Breasts: gyn Heart: regular rate and rhythm, S1, S2 normal, no murmur, click, rub or gallop Abdomen: soft, non-tender; bowel sounds normal; no masses,  no organomegaly Pelvic: deferred-- gyn Extremities:  extremities normal, atraumatic, no cyanosis or edema Pulses: 2+ and symmetric Skin: Skin color, texture, turgor normal. No rashes or lesions Lymph nodes: Cervical, supraclavicular, and axillary nodes normal. Neurologic: Alert and oriented X 3, normal strength and tone. Normal symmetric reflexes. Normal coordination and gait psych-- no depression, no anxiety    Assessment:    Healthy female exam.      Plan:  Check labs ghm utd   See After Visit Summary for Counseling Recommendations

## 2012-04-09 HISTORY — PX: BUNIONECTOMY WITH HAMMERTOE RECONSTRUCTION: SHX5600

## 2012-04-25 ENCOUNTER — Encounter: Payer: Self-pay | Admitting: Gastroenterology

## 2012-06-04 ENCOUNTER — Encounter: Payer: Self-pay | Admitting: Family Medicine

## 2012-07-11 ENCOUNTER — Telehealth: Payer: Self-pay | Admitting: Family Medicine

## 2012-07-11 MED ORDER — VALSARTAN-HYDROCHLOROTHIAZIDE 160-25 MG PO TABS: 1.0000 | ORAL_TABLET | Freq: Every day | ORAL | Status: DC

## 2012-07-11 NOTE — Telephone Encounter (Signed)
PT called wanted to see if Dr Laury Axon could  send a rx for her blood pressure to [facility] on [address] ([place]

## 2012-08-14 ENCOUNTER — Ambulatory Visit (INDEPENDENT_AMBULATORY_CARE_PROVIDER_SITE_OTHER): Payer: BC Managed Care – PPO | Admitting: Family Medicine

## 2012-08-14 ENCOUNTER — Encounter: Payer: Self-pay | Admitting: Family Medicine

## 2012-08-14 VITALS — BP 114/76 | HR 71 | Temp 98.3°F | Wt 176.0 lb

## 2012-08-14 DIAGNOSIS — I10 Essential (primary) hypertension: Secondary | ICD-10-CM

## 2012-08-14 DIAGNOSIS — Z8619 Personal history of other infectious and parasitic diseases: Secondary | ICD-10-CM

## 2012-08-14 LAB — BASIC METABOLIC PANEL
Calcium: 9 mg/dL (ref 8.4–10.5)
Chloride: 104 mEq/L (ref 96–112)
Creatinine, Ser: 0.7 mg/dL (ref 0.4–1.2)
GFR: 104.8 mL/min (ref >60.00)

## 2012-08-14 MED ORDER — VALSARTAN-HYDROCHLOROTHIAZIDE 160-25 MG PO TABS: 1.0000 | ORAL_TABLET | Freq: Every day | ORAL | Status: DC

## 2012-08-14 MED ORDER — VALACYCLOVIR HCL 1 G PO TABS: 1000.0000 mg | ORAL_TABLET | Freq: Two times a day (BID) | ORAL | Status: DC

## 2012-08-14 NOTE — Progress Notes (Signed)
  Subjective:    Patient here for follow-up of elevated blood pressure.  She is exercising and is adherent to a low-salt diet.  Blood pressure is well controlled at home. Cardiac symptoms: none. Patient denies: chest pain, chest pressure/discomfort, claudication, dyspnea, exertional chest pressure/discomfort, fatigue, irregular heart beat, lower extremity edema, near-syncope, orthopnea, palpitations, paroxysmal nocturnal dyspnea, syncope and tachypnea. Cardiovascular risk factors: dyslipidemia and hypertension. Use of agents associated with hypertension: none. History of target organ damage: none.  The following portions of the patient's history were reviewed and updated as appropriate: allergies, current medications, past family history, past medical history, past social history, past surgical history and problem list.  Review of Systems Pertinent items are noted in HPI.     Objective:    BP 114/76  Pulse 71  Temp(Src) 98.3 F (36.8 C) (Oral)  Wt 176 lb (79.833 kg)  BMI 28.83 kg/m2  SpO2 97% General appearance: alert, cooperative, appears stated age and no distress Neck: no adenopathy, supple, symmetrical, trachea midline and thyroid not enlarged, symmetric, no tenderness/mass/nodules Lungs: clear to auscultation bilaterally Heart: S1, S2 normal Extremities: extremities normal, atraumatic, no cyanosis or edema    Assessment:    Hypertension, normal blood pressure . Evidence of target organ damage: none.    Plan:    Medication: no change.

## 2012-08-14 NOTE — Assessment & Plan Note (Signed)
Stable con't meds 

## 2012-08-14 NOTE — Patient Instructions (Signed)

## 2012-08-20 ENCOUNTER — Other Ambulatory Visit: Payer: Self-pay | Admitting: Family Medicine

## 2012-08-20 DIAGNOSIS — E876 Hypokalemia: Secondary | ICD-10-CM

## 2012-08-20 MED ORDER — POTASSIUM CHLORIDE CRYS ER 20 MEQ PO TBCR: 20.0000 meq | EXTENDED_RELEASE_TABLET | Freq: Every day | ORAL | Status: DC

## 2012-08-22 DIAGNOSIS — E876 Hypokalemia: Secondary | ICD-10-CM

## 2012-08-22 MED ORDER — POTASSIUM CHLORIDE CRYS ER 20 MEQ PO TBCR: 20.0000 meq | EXTENDED_RELEASE_TABLET | Freq: Every day | ORAL | Status: DC

## 2012-08-25 ENCOUNTER — Other Ambulatory Visit: Payer: Self-pay | Admitting: General Practice

## 2012-08-25 DIAGNOSIS — M1710 Unilateral primary osteoarthritis, unspecified knee: Secondary | ICD-10-CM

## 2012-08-25 DIAGNOSIS — I10 Essential (primary) hypertension: Secondary | ICD-10-CM

## 2012-08-25 MED ORDER — MELOXICAM 15 MG PO TABS: 15.0000 mg | ORAL_TABLET | Freq: Every day | ORAL | Status: DC

## 2012-08-25 MED ORDER — POTASSIUM CHLORIDE CRYS ER 20 MEQ PO TBCR: 20.0000 meq | EXTENDED_RELEASE_TABLET | Freq: Every day | ORAL | Status: DC

## 2012-08-25 MED ORDER — VALSARTAN-HYDROCHLOROTHIAZIDE 160-25 MG PO TABS: 1.0000 | ORAL_TABLET | Freq: Every day | ORAL | Status: DC

## 2012-09-02 ENCOUNTER — Other Ambulatory Visit: Payer: BC Managed Care – PPO

## 2013-01-15 ENCOUNTER — Encounter: Payer: Self-pay | Admitting: Gastroenterology

## 2013-03-16 ENCOUNTER — Other Ambulatory Visit: Payer: Self-pay | Admitting: Family Medicine

## 2013-04-09 DIAGNOSIS — H269 Unspecified cataract: Secondary | ICD-10-CM

## 2013-04-09 HISTORY — PX: COLONOSCOPY: SHX174

## 2013-04-09 HISTORY — PX: CATARACT EXTRACTION: SUR2

## 2013-04-09 HISTORY — DX: Unspecified cataract: H26.9

## 2013-05-02 ENCOUNTER — Ambulatory Visit (INDEPENDENT_AMBULATORY_CARE_PROVIDER_SITE_OTHER): Payer: BC Managed Care – PPO | Admitting: Family Medicine

## 2013-05-02 ENCOUNTER — Encounter: Payer: Self-pay | Admitting: Family Medicine

## 2013-05-02 VITALS — BP 154/90 | HR 87 | Temp 99.2°F | Wt 177.0 lb

## 2013-05-02 DIAGNOSIS — J209 Acute bronchitis, unspecified: Secondary | ICD-10-CM

## 2013-05-02 MED ORDER — HYDROCODONE-HOMATROPINE 5-1.5 MG/5ML PO SYRP: 5.0000 mL | ORAL_SOLUTION | Freq: Four times a day (QID) | ORAL | Status: AC | PRN

## 2013-05-02 NOTE — Progress Notes (Signed)
   Subjective:    Patient ID: Candace Bell, female    DOB: [birth year], 63 y.o.   MRN: [id number]  HPI Acute visit.one-day history of cough, nasal congestion, fatigue.  She's had mostly nonproductive cough. No fevers or chills. No smoke sore throat. Denies any nausea or vomiting. She has not taken any over-the-counter cough medications. She had significant coughing last night. Patient nonsmoker. No history of asthma. She has allergies to penicillin.  Past Medical History  Diagnosis Date  . Arthritis   . Hypertension    Past Surgical History  Procedure Laterality Date  . Back surgery    . Abdominal hysterectomy  1993    TAH --took one ovary    reports that she has never smoked. She does not have any smokeless tobacco history on file. She reports that she does not drink alcohol or use illicit drugs. family history includes Diabetes in her mother. Allergies  Allergen Reactions  . Amoxicillin Anaphylaxis and Swelling  . Other Anaphylaxis    All -cillins    . Penicillins Anaphylaxis and Swelling      Review of Systems  Constitutional: Positive for fatigue. Negative for fever and chills.  HENT: Positive for congestion.   Respiratory: Positive for cough. Negative for wheezing.   Cardiovascular: Negative for chest pain.  Neurological: Negative for headaches.       Objective:   Physical Exam  Constitutional: She appears well-developed and well-nourished.  HENT:  Left Ear: External ear normal.  Mouth/Throat: Oropharynx is clear and moist.  Moderate cerumen right canal  Neck: Neck supple.  Cardiovascular: Normal rate and regular rhythm.   Pulmonary/Chest: Effort normal and breath sounds normal. No respiratory distress. She has no wheezes. She has no rales.  Lymphadenopathy:    She has no cervical adenopathy.          Assessment & Plan:  Acute bronchitis. Suspect viral in etiology. Nonfocal exam. Treat symptomatically. Hycodan cough syrup 1 teaspoon each bedtime for  severe coughing.

## 2013-05-02 NOTE — Progress Notes (Signed)
Pre visit review using our clinic review tool, if applicable. No additional management support is needed unless otherwise documented below in the visit note. 

## 2013-05-02 NOTE — Patient Instructions (Signed)
Acute Bronchitis Bronchitis is inflammation of the airways that extend from the windpipe into the lungs (bronchi). The inflammation often causes mucus to develop. This leads to a cough, which is the most common symptom of bronchitis.  In acute bronchitis, the condition usually develops suddenly and goes away over time, usually in a couple weeks. Smoking, allergies, and asthma can make bronchitis worse. Repeated episodes of bronchitis may cause further lung problems.  CAUSES Acute bronchitis is most often caused by the same virus that causes a cold. The virus can spread from person to person (contagious).  SIGNS AND SYMPTOMS   Cough.   Fever.   Coughing up mucus.   Body aches.   Chest congestion.   Chills.   Shortness of breath.   Sore throat.  DIAGNOSIS  Acute bronchitis is usually diagnosed through a physical exam. Tests, such as chest X-rays, are sometimes done to rule out other conditions.  TREATMENT  Acute bronchitis usually goes away in a couple weeks. Often times, no medical treatment is necessary. Medicines are sometimes given for relief of fever or cough. Antibiotics are usually not needed but may be prescribed in certain situations. In some cases, an inhaler may be recommended to help reduce shortness of breath and control the cough. A cool mist vaporizer may also be used to help thin bronchial secretions and make it easier to clear the chest.  HOME CARE INSTRUCTIONS  Get plenty of rest.   Drink enough fluids to keep your urine clear or pale yellow (unless you have a medical condition that requires fluid restriction). Increasing fluids may help thin your secretions and will prevent dehydration.   Only take over-the-counter or prescription medicines as directed by your health care provider.   Avoid smoking and secondhand smoke. Exposure to cigarette smoke or irritating chemicals will make bronchitis worse. If you are a smoker, consider using nicotine gum or skin  patches to help control withdrawal symptoms. Quitting smoking will help your lungs heal faster.   Reduce the chances of another bout of acute bronchitis by washing your hands frequently, avoiding people with cold symptoms, and trying not to touch your hands to your mouth, nose, or eyes.   Follow up with your health care provider as directed.  SEEK MEDICAL CARE IF: Your symptoms do not improve after 1 week of treatment.  SEEK IMMEDIATE MEDICAL CARE IF:  You develop an increased fever or chills.   You have chest pain.   You have severe shortness of breath.  You have bloody sputum.   You develop dehydration.  You develop fainting.  You develop repeated vomiting.  You develop a severe headache. MAKE SURE YOU:   Understand these instructions.  Will watch your condition.  Will get help right away if you are not doing well or get worse. Document Released: 05/03/2004 Document Revised: 11/26/2012 Document Reviewed: 09/16/2012 [employer] Patient Information 2014 [employer].  

## 2013-06-03 ENCOUNTER — Telehealth: Payer: Self-pay

## 2013-06-03 NOTE — Telephone Encounter (Signed)
Left message for call back Non-identifiable   Pap-hx. Hysterectomy [facility]- 05/30/07 [facility]- 03/24/12- negative-[facility] BD- 03/24/12- negative- [facility] Flu Td- PNA- Z-

## 2013-06-04 ENCOUNTER — Encounter: Payer: Medicare Other | Admitting: Family Medicine

## 2013-06-07 HISTORY — PX: EYE SURGERY: SHX253

## 2013-06-08 NOTE — Telephone Encounter (Signed)
Appt. cancelled due to weather.   

## 2013-07-24 LAB — HM MAMMOGRAPHY: HM MAMMO: NEGATIVE

## 2013-07-30 ENCOUNTER — Telehealth: Payer: Self-pay

## 2013-07-30 NOTE — Telephone Encounter (Signed)
Medication and allergies:  Reviewed and updated  90 day supply/mail order: n/a Local pharmacy:  [id number] - [place], [place]   Immunizations due: see below     A/P: No changes to personal, family history or past surgical hx PAP- will schedule soon CCS- 05/30/07- normal MMG- 07/24/13 BD- 03/24/12- normal Flu- 02/2013  Tdap- within the last 10 year PNA- due Shingles- due  To Discuss with Provider: Nothing at this time.

## 2013-07-31 ENCOUNTER — Ambulatory Visit (INDEPENDENT_AMBULATORY_CARE_PROVIDER_SITE_OTHER): Payer: Medicare Other | Admitting: Family Medicine

## 2013-07-31 ENCOUNTER — Encounter: Payer: Self-pay | Admitting: Family Medicine

## 2013-07-31 VITALS — BP 118/88 | HR 96 | Temp 98.1°F | Ht 65.0 in | Wt 174.2 lb

## 2013-07-31 DIAGNOSIS — Z23 Encounter for immunization: Secondary | ICD-10-CM

## 2013-07-31 DIAGNOSIS — I10 Essential (primary) hypertension: Secondary | ICD-10-CM

## 2013-07-31 DIAGNOSIS — Z8619 Personal history of other infectious and parasitic diseases: Secondary | ICD-10-CM

## 2013-07-31 DIAGNOSIS — Z0000 Encounter for general adult medical examination without abnormal findings: Secondary | ICD-10-CM

## 2013-07-31 LAB — POCT URINALYSIS DIPSTICK
Bilirubin, UA: NEGATIVE
GLUCOSE UA: NEGATIVE
Ketones, UA: NEGATIVE
Leukocytes, UA: NEGATIVE
Nitrite, UA: NEGATIVE
PH UA: 5
Protein, UA: NEGATIVE
RBC UA: NEGATIVE
Spec Grav, UA: >=1.03
UROBILINOGEN UA: 0.2

## 2013-07-31 MED ORDER — ZOSTER VACCINE LIVE 19400 UNT/0.65ML ~~LOC~~ SOLR: 0.6500 mL | Freq: Once | SUBCUTANEOUS | Status: DC

## 2013-07-31 MED ORDER — VALACYCLOVIR HCL 1 G PO TABS: 1000.0000 mg | ORAL_TABLET | Freq: Two times a day (BID) | ORAL | Status: DC

## 2013-07-31 MED ORDER — VALSARTAN-HYDROCHLOROTHIAZIDE 160-25 MG PO TABS: ORAL_TABLET | ORAL | Status: DC

## 2013-07-31 NOTE — Patient Instructions (Signed)

## 2013-07-31 NOTE — Progress Notes (Signed)
Subjective:    Candace Bell is a 63 y.o. female who presents for a welcome to Medicare exam.   Cardiac risk factors: advanced age (older than 76 for men, 59 for women), hypertension and obesity (BMI >= 30 kg/m2).  Activities of Daily Living  In your present state of health, do you have any difficulty performing the following activities?:  Preparing food and eating?: No Bathing yourself: No Getting dressed: No Using the toilet:No Moving around from place to place: No In the past year have you fallen or had a near fall?:No  Current exercise habits: Gym/ health club routine includes walking.   Dietary issues discussed: na   Depression Screen (Note: if answer to either of the following is "Yes", then a more complete depression screening is indicated)  Q1: Over the past two weeks, have you felt down, depressed or hopeless?no Q2: Over the past two weeks, have you felt little interest or pleasure in doing things? no   The following portions of the patient's history were reviewed and updated as appropriate:  She  has a past medical history of Arthritis and Hypertension. She  does not have any pertinent problems on file. She  has past surgical history that includes Back surgery and Abdominal hysterectomy (1993). Her family history includes Diabetes in her mother. She  reports that she has never smoked. She does not have any smokeless tobacco history on file. She reports that she does not drink alcohol or use illicit drugs. She has a current medication list which includes the following prescription(s): ibuprofen, meloxicam, potassium chloride sa, valacyclovir, valsartan-hydrochlorothiazide, and zoster vaccine live (pf). Current Outpatient Prescriptions on File Prior to Visit  Medication Sig Dispense Refill  . ibuprofen (ADVIL,MOTRIN) 800 MG tablet Take 800 mg by mouth every 8 (eight) hours as needed. For pain      . meloxicam (MOBIC) 15 MG tablet Take 1 tablet (15 mg total) by mouth  daily.  90 tablet  2  . potassium chloride SA (K-DUR,KLOR-CON) 20 MEQ tablet Take 1 tablet (20 mEq total) by mouth daily.  90 tablet  0   No current facility-administered medications on file prior to visit.   She is allergic to amoxicillin; other; and penicillins.. Review of Systems  Review of Systems  Constitutional: Negative for activity change, appetite change and fatigue.  HENT: Negative for hearing loss, congestion, tinnitus and ear discharge.   Eyes: Negative for visual disturbance (see optho q1y -- vision corrected to 20/20 with glasses).  Respiratory: Negative for cough, chest tightness and shortness of breath.   Cardiovascular: Negative for chest pain, palpitations and leg swelling.  Gastrointestinal: Negative for abdominal pain, diarrhea, constipation and abdominal distention.  Genitourinary: Negative for urgency, frequency, decreased urine volume and difficulty urinating.  Musculoskeletal: Negative for back pain, arthralgias and gait problem.  Skin: Negative for color change, pallor and rash.  Neurological: Negative for dizziness, light-headedness, numbness and headaches.  Hematological: Negative for adenopathy. Does not bruise/bleed easily.  Psychiatric/Behavioral: Negative for suicidal ideas, confusion, sleep disturbance, self-injury, dysphoric mood, decreased concentration and agitation.  Pt is able to read and write and can do all ADLs No risk for falling No abuse/ violence in home      Objective:     Vision by Snellen chart:opth Blood pressure 118/88, pulse 96, temperature 98.1 F (36.7 C), temperature source Oral, height 5\' 5"  (1.651 m), weight 174 lb 3.2 oz (79.017 kg), SpO2 98.00%. Body mass index is 28.99 kg/(m^2). BP 118/88  Pulse 96  Temp(Src)  98.1 F (36.7 C) (Oral)  Ht 5\' 5"  (1.651 m)  Wt 174 lb 3.2 oz (79.017 kg)  BMI 28.99 kg/m2  SpO2 98% General appearance: alert, cooperative, appears stated age and no distress Head: Normocephalic, without obvious  abnormality, atraumatic Eyes: conjunctivae/corneas clear. PERRL, EOM's intact. Fundi benign. Ears: normal TM's and external ear canals both ears Nose: Nares normal. Septum midline. Mucosa normal. No drainage or sinus tenderness. Throat: lips, mucosa, and tongue normal; teeth and gums normal Neck: no adenopathy, supple, symmetrical, trachea midline and thyroid not enlarged, symmetric, no tenderness/mass/nodules Back: symmetric, no curvature. ROM normal. No CVA tenderness. Lungs: clear to auscultation bilaterally Breasts: gyn Heart: S1, S2 normal Abdomen: soft, non-tender; bowel sounds normal; no masses,  no organomegaly Pelvic: deferred-gyn Extremities: extremities normal, atraumatic, no cyanosis or edema Pulses: 2+ and symmetric Skin: Skin color, texture, turgor normal. No rashes or lesions Lymph nodes: Cervical, supraclavicular, and axillary nodes normal. Neurologic: Alert and oriented X 3, normal strength and tone. Normal symmetric reflexes. Normal coordination and gait Psych--no depression , no anxiety      Assessment:     cpe       Plan:     During the course of the visit the patient was educated and counseled about appropriate screening and preventive services including:   Pneumococcal vaccine   Screening electrocardiogram  Screening mammography  Screening Pap smear and pelvic exam   Bone densitometry screening  Colorectal cancer screening  Diabetes screening  Glaucoma screening  Advanced directives: has an advanced directive - a copy HAS NOT been provided.  Patient Instructions (the written plan) was given to the patient.   1. HTN (hypertension) Stable , check labs - Basic metabolic panel - CBC with Differential - Hepatic function panel - Lipid panel - POCT urinalysis dipstick - valsartan-hydrochlorothiazide (DIOVAN-HCT) 160-25 MG per tablet; 1 tablet by mouth daily--  Dispense: 30 tablet; Refill: 0  2. History of cold sores  - valACYclovir (VALTREX)  1000 MG tablet; Take 1 tablet (1,000 mg total) by mouth 2 (two) times daily.  Dispense: 20 tablet; Refill: 2  3. Preventative health care  - EKG 12-Lead - zoster vaccine live, PF, (ZOSTAVAX) 28366 UNT/0.65ML injection; Inject 19,400 Units into the skin once.  Dispense: 1 vial; Refill: 0  4. Welcome to Medicare preventive visit  - EKG 12-Lead

## 2013-07-31 NOTE — Addendum Note (Signed)
Addended by: Ewing Schlein on: 07/31/2013 05:21 PM   Modules accepted: Orders

## 2013-07-31 NOTE — Progress Notes (Signed)
Pre visit review using our clinic review tool, if applicable. No additional management support is needed unless otherwise documented below in the visit note. 

## 2013-08-01 LAB — BASIC METABOLIC PANEL
BUN: 16 mg/dL (ref 6–23)
CO2: 28 mEq/L (ref 19–32)
CREATININE: 0.7 mg/dL (ref 0.50–1.10)
Calcium: 9.7 mg/dL (ref 8.4–10.5)
Chloride: 103 mEq/L (ref 96–112)
Glucose, Bld: 88 mg/dL (ref 70–99)
Potassium: 3.9 mEq/L (ref 3.5–5.3)
Sodium: 140 mEq/L (ref 135–145)

## 2013-08-01 LAB — CBC WITH DIFFERENTIAL/PLATELET
BASOS ABS: 0.1 10*3/uL (ref 0.0–0.1)
Basophils Relative: 1 % (ref 0–1)
Eosinophils Absolute: 0.1 10*3/uL (ref 0.0–0.7)
Eosinophils Relative: 2 % (ref 0–5)
HCT: 39.4 % (ref 36.0–46.0)
Hemoglobin: 12.9 g/dL (ref 12.0–15.0)
LYMPHS ABS: 2.1 10*3/uL (ref 0.7–4.0)
LYMPHS PCT: 40 % (ref 12–46)
MCH: 27.7 pg (ref 26.0–34.0)
MCHC: 32.7 g/dL (ref 30.0–36.0)
MCV: 84.7 fL (ref 78.0–100.0)
Monocytes Absolute: 0.3 10*3/uL (ref 0.1–1.0)
Monocytes Relative: 6 % (ref 3–12)
Neutro Abs: 2.7 10*3/uL (ref 1.7–7.7)
Neutrophils Relative %: 51 % (ref 43–77)
PLATELETS: 250 10*3/uL (ref 150–400)
RBC: 4.65 MIL/uL (ref 3.87–5.11)
RDW: 14 % (ref 11.5–15.5)
WBC: 5.2 10*3/uL (ref 4.0–10.5)

## 2013-08-01 LAB — LIPID PANEL
Cholesterol: 154 mg/dL (ref 0–200)
HDL: 55 mg/dL (ref >39)
LDL CALC: 87 mg/dL (ref 0–99)
TRIGLYCERIDES: 61 mg/dL (ref <150)
Total CHOL/HDL Ratio: 2.8 Ratio
VLDL: 12 mg/dL (ref 0–40)

## 2013-08-01 LAB — HEPATIC FUNCTION PANEL
ALK PHOS: 73 U/L (ref 39–117)
ALT: 13 U/L (ref 0–35)
AST: 17 U/L (ref 0–37)
Albumin: 4.4 g/dL (ref 3.5–5.2)
BILIRUBIN INDIRECT: 0.8 mg/dL (ref 0.2–1.2)
BILIRUBIN TOTAL: 0.9 mg/dL (ref 0.2–1.2)
Bilirubin, Direct: 0.1 mg/dL (ref 0.0–0.3)
Total Protein: 7.5 g/dL (ref 6.0–8.3)

## 2013-08-03 ENCOUNTER — Telehealth: Payer: Self-pay | Admitting: Family Medicine

## 2013-08-03 NOTE — Telephone Encounter (Signed)
Relevant patient education mailed to patient.  

## 2013-08-18 ENCOUNTER — Telehealth: Payer: Self-pay | Admitting: Family Medicine

## 2013-08-18 DIAGNOSIS — I10 Essential (primary) hypertension: Secondary | ICD-10-CM

## 2013-08-18 MED ORDER — VALSARTAN-HYDROCHLOROTHIAZIDE 160-25 MG PO TABS: ORAL_TABLET | ORAL | Status: DC

## 2013-08-18 NOTE — Addendum Note (Signed)
Addended by: Ewing Schlein on: 08/18/2013 04:34 PM   Modules accepted: Orders

## 2013-08-18 NOTE — Telephone Encounter (Signed)
Caller name:Cinderella Rohm  Relation to UX:[id number]  Call back number: [phone] Pharmacy:[facility] [place], [address]   Reason for call:  To request a refill for valsartan-hydrochlorothiazide (DIOVAN-HCT) 160-25 MG per tablet because she does not have another refill for next month.

## 2013-08-18 NOTE — Telephone Encounter (Signed)
Medication faxed      KP

## 2013-11-22 ENCOUNTER — Emergency Department (HOSPITAL_COMMUNITY): Payer: No Typology Code available for payment source

## 2013-11-22 ENCOUNTER — Encounter (HOSPITAL_COMMUNITY): Payer: Self-pay | Admitting: Emergency Medicine

## 2013-11-22 ENCOUNTER — Emergency Department (HOSPITAL_COMMUNITY)
Admission: EM | Admit: 2013-11-22 | Discharge: 2013-11-22 | Disposition: A | Discharge status: Home / Self Care | Payer: No Typology Code available for payment source | Attending: Emergency Medicine | Admitting: Emergency Medicine

## 2013-11-22 DIAGNOSIS — T148XXA Other injury of unspecified body region, initial encounter: Secondary | ICD-10-CM

## 2013-11-22 DIAGNOSIS — S199XXA Unspecified injury of neck, initial encounter: Secondary | ICD-10-CM | POA: Diagnosis present

## 2013-11-22 DIAGNOSIS — Z880 Allergy status to penicillin: Secondary | ICD-10-CM | POA: Insufficient documentation

## 2013-11-22 DIAGNOSIS — S139XXA Sprain of joints and ligaments of unspecified parts of neck, initial encounter: Secondary | ICD-10-CM | POA: Diagnosis not present

## 2013-11-22 DIAGNOSIS — M129 Arthropathy, unspecified: Secondary | ICD-10-CM | POA: Diagnosis not present

## 2013-11-22 DIAGNOSIS — Y9389 Activity, other specified: Secondary | ICD-10-CM | POA: Diagnosis not present

## 2013-11-22 DIAGNOSIS — Z791 Long term (current) use of non-steroidal anti-inflammatories (NSAID): Secondary | ICD-10-CM | POA: Insufficient documentation

## 2013-11-22 DIAGNOSIS — S0993XA Unspecified injury of face, initial encounter: Secondary | ICD-10-CM | POA: Diagnosis present

## 2013-11-22 DIAGNOSIS — Z79899 Other long term (current) drug therapy: Secondary | ICD-10-CM | POA: Diagnosis not present

## 2013-11-22 DIAGNOSIS — Y92410 Unspecified street and highway as the place of occurrence of the external cause: Secondary | ICD-10-CM | POA: Diagnosis not present

## 2013-11-22 DIAGNOSIS — I10 Essential (primary) hypertension: Secondary | ICD-10-CM | POA: Insufficient documentation

## 2013-11-22 MED ORDER — OXYCODONE-ACETAMINOPHEN 5-325 MG PO TABS
1.0000 | ORAL_TABLET | Freq: Once | ORAL | Status: AC
  Administered 2013-11-22: 1 via ORAL
  Filled 2013-11-22: qty 1

## 2013-11-22 MED ORDER — METHOCARBAMOL 500 MG PO TABS: 500.0000 mg | ORAL_TABLET | Freq: Two times a day (BID) | ORAL | Status: DC

## 2013-11-22 MED ORDER — OXYCODONE-ACETAMINOPHEN 5-325 MG PO TABS: 1.0000 | ORAL_TABLET | Freq: Four times a day (QID) | ORAL | Status: DC | PRN
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
## 2013-11-22 NOTE — ED Notes (Signed)
Pt to xray at this time.

## 2013-11-22 NOTE — ED Provider Notes (Signed)
I saw and evaluated the patient, reviewed the resident's note and I agree with the findings and plan.   EKG Interpretation None       Jasper Riling. Alvino Chapel, MD 11/22/13 2337
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
## 2013-11-24 ENCOUNTER — Encounter: Payer: Self-pay | Admitting: Gastroenterology

## 2013-11-25 ENCOUNTER — Ambulatory Visit (INDEPENDENT_AMBULATORY_CARE_PROVIDER_SITE_OTHER): Payer: Medicare Other | Admitting: Medical

## 2013-11-25 ENCOUNTER — Ambulatory Visit (HOSPITAL_BASED_OUTPATIENT_CLINIC_OR_DEPARTMENT_OTHER)
Admission: RE | Admit: 2013-11-25 | Discharge: 2013-11-25 | Disposition: A | Discharge status: Home / Self Care | Payer: No Typology Code available for payment source | Source: Ambulatory Visit | Attending: Medical | Admitting: Medical

## 2013-11-25 ENCOUNTER — Encounter: Payer: Self-pay | Admitting: Medical

## 2013-11-25 VITALS — BP 119/80 | HR 93 | Temp 98.2°F | Wt 178.0 lb

## 2013-11-25 DIAGNOSIS — M546 Pain in thoracic spine: Secondary | ICD-10-CM

## 2013-11-25 DIAGNOSIS — M545 Low back pain, unspecified: Secondary | ICD-10-CM

## 2013-11-25 DIAGNOSIS — M549 Dorsalgia, unspecified: Secondary | ICD-10-CM | POA: Insufficient documentation

## 2013-11-25 DIAGNOSIS — Q762 Congenital spondylolisthesis: Secondary | ICD-10-CM | POA: Diagnosis not present

## 2013-11-25 MED ORDER — HYDROCODONE-ACETAMINOPHEN 7.5-325 MG PO TABS: 1.0000 | ORAL_TABLET | Freq: Four times a day (QID) | ORAL | Status: DC | PRN

## 2013-11-25 NOTE — Progress Notes (Signed)
Pre visit review using our clinic review tool, if applicable. No additional management support is needed unless otherwise documented below in the visit note. 

## 2013-11-25 NOTE — Patient Instructions (Signed)
For your upper back pain in prior region of surgery I do want to get a thoracic spine xray. Also get a lumbar spine xray for recent pain in that area. Continue meloxicam prescription ED wrote you. Stop naproxen or any otc nsaid. Continue with robaxin for muscle relaxant as ED wrote. I think at this point could stop percocet and will prescribe hydrocodone.(do not combine pain meds) If pain progressively decreasing in near future then prescribe less potent pain medication. If your pain persists  particularly in area of prior surgery would strongly consider referral back to prior surgeon. Please keep [place] update if pain worsens or changes in that area. Follow up in 7 days or as needed.
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
## 2013-11-26 ENCOUNTER — Telehealth: Payer: Self-pay | Admitting: Family Medicine

## 2013-11-26 ENCOUNTER — Telehealth: Payer: Self-pay | Admitting: Medical

## 2013-11-26 NOTE — Telephone Encounter (Signed)
You could advise her to stop hydrocodone I gave her. ED gave her percocet. I thought that was too strong in light of neg xray findings in ED. She could restart percocet but explain to her/stress do not use both percocet and hydrocodone. Advise her and document to that effect. Sounds like she will see Dr. Etter Sjogren in a week. If needs to be seen before let me know I can see her.

## 2013-11-26 NOTE — Telephone Encounter (Signed)
pt continues to have same pain in her upper and lower back, pt states medications help a little . Advised pt again that there were no pertinent acute findings on her xray's besides the degenerative changes. Advised pt to continue taking her medications until her next follow-up 12/02/13 @[facility]  and if symptoms get worse to let us know or go to ED .

## 2013-11-26 NOTE — Telephone Encounter (Signed)
Caller name: Brion  Relation to pt: self  Call back number: [phone]   Reason for call: inquiring about x ray results either way pt would like to speak with Percell Miller.

## 2013-11-26 NOTE — Telephone Encounter (Signed)
Caller name: Satin  Relation to pt: self  Call back number: [phone]  Reason for call: pt in need of some more clarification regarding her x -ray results. Please call [phone].

## 2013-11-26 NOTE — Telephone Encounter (Signed)
Pt notified of xray results . Pt verbalized understanding

## 2013-11-30 NOTE — Telephone Encounter (Signed)
Pt states does not like taking pain medications. Pt will continue taking hydrocodone and follow-up on Wednesday. Pt states pain is still the same.

## 2013-12-01 ENCOUNTER — Ambulatory Visit: Payer: Medicare Other | Admitting: Family Medicine

## 2013-12-02 ENCOUNTER — Ambulatory Visit (INDEPENDENT_AMBULATORY_CARE_PROVIDER_SITE_OTHER): Payer: Medicare Other | Admitting: Family Medicine

## 2013-12-02 ENCOUNTER — Encounter: Payer: Self-pay | Admitting: Family Medicine

## 2013-12-02 VITALS — BP 120/80 | HR 83 | Temp 98.2°F | Wt 180.0 lb

## 2013-12-02 DIAGNOSIS — M545 Low back pain, unspecified: Secondary | ICD-10-CM

## 2013-12-02 NOTE — Progress Notes (Signed)
Pre visit review using our clinic review tool, if applicable. No additional management support is needed unless otherwise documented below in the visit note. 

## 2013-12-02 NOTE — Patient Instructions (Signed)
Back Pain, Adult Low back pain is very common. About 1 in 5 people have back pain.The cause of low back pain is rarely dangerous. The pain often gets better over time.About half of people with a sudden onset of back pain feel better in just 2 weeks. About 8 in 10 people feel better by 6 weeks.  CAUSES Some common causes of back pain include:  Strain of the muscles or ligaments supporting the spine.  Wear and tear (degeneration) of the spinal discs.  Arthritis.  Direct injury to the back. DIAGNOSIS Most of the time, the direct cause of low back pain is not known.However, back pain can be treated effectively even when the exact cause of the pain is unknown.Answering your caregiver's questions about your overall health and symptoms is one of the most accurate ways to make sure the cause of your pain is not dangerous. If your caregiver needs more information, he or she may order lab work or imaging tests (X-rays or MRIs).However, even if imaging tests show changes in your back, this usually does not require surgery. HOME CARE INSTRUCTIONS For many people, back pain returns.Since low back pain is rarely dangerous, it is often a condition that people can learn to manageon their own.   Remain active. It is stressful on the back to sit or stand in one place. Do not sit, drive, or stand in one place for more than 30 minutes at a time. Take short walks on level surfaces as soon as pain allows.Try to increase the length of time you walk each day.  Do not stay in bed.Resting more than 1 or 2 days can delay your recovery.  Do not avoid exercise or work.Your body is made to move.It is not dangerous to be active, even though your back may hurt.Your back will likely heal faster if you return to being active before your pain is gone.  Pay attention to your body when you bend and lift. Many people have less discomfortwhen lifting if they bend their knees, keep the load close to their bodies,and  avoid twisting. Often, the most comfortable positions are those that put less stress on your recovering back.  Find a comfortable position to sleep. Use a firm mattress and lie on your side with your knees slightly bent. If you lie on your back, put a pillow under your knees.  Only take over-the-counter or prescription medicines as directed by your caregiver. Over-the-counter medicines to reduce pain and inflammation are often the most helpful.Your caregiver may prescribe muscle relaxant drugs.These medicines help dull your pain so you can more quickly return to your normal activities and healthy exercise.  Put ice on the injured area.  Put ice in a plastic bag.  Place a towel between your skin and the bag.  Leave the ice on for 15-20 minutes, 03-04 times a day for the first 2 to 3 days. After that, ice and heat may be alternated to reduce pain and spasms.  Ask your caregiver about trying back exercises and gentle massage. This may be of some benefit.  Avoid feeling anxious or stressed.Stress increases muscle tension and can worsen back pain.It is important to recognize when you are anxious or stressed and learn ways to manage it.Exercise is a great option. SEEK MEDICAL CARE IF:  You have pain that is not relieved with rest or medicine.  You have pain that does not improve in 1 week.  You have new symptoms.  You are generally not feeling well. SEEK   IMMEDIATE MEDICAL CARE IF:   You have pain that radiates from your back into your legs.  You develop new bowel or bladder control problems.  You have unusual weakness or numbness in your arms or legs.  You develop nausea or vomiting.  You develop abdominal pain.  You feel faint. Document Released: 03/26/2005 Document Revised: 09/25/2011 Document Reviewed: 07/28/2013 ExitCare Patient Information 2015 [employer]. This information is not intended to replace advice given to you by your health care provider. Make sure you  discuss any questions you have with your health care provider.  

## 2013-12-02 NOTE — Progress Notes (Signed)
  Subjective:    Candace Bell is a 63 y.o. female who presents for follow up of low back problems. Current symptoms include: pain in low back (aching in character; 5/10 in severity). Symptoms have improved from the previous visit. Exacerbating factors identified by the patient are sitting, standing and walking.  The following portions of the patient's history were reviewed and updated as appropriate: allergies, current medications, past family history, past medical history, past social history, past surgical history and problem list.    Objective:    BP 120/80  Pulse 83  Temp(Src) 98.2 F (36.8 C) (Oral)  Wt 180 lb (81.647 kg)  SpO2 96% General appearance: alert, cooperative, appears stated age and no distress Back: symmetric, no curvature. ROM normal. No CVA tenderness. Neurologic: Motor: slightly weaker in R leg with hip flexion and low leg ext Gait: Normal    Assessment:    Nonspecific acute low back pain    Plan:    Natural history and expected course discussed. Questions answered. Neurosurgeon distributed. Regular aerobic and trunk strengthening exercises discussed. Short (2-4 day) period of relative rest recommended until acute symptoms improve. Ice to affected area as needed for local pain relief. Heat to affected area as needed for local pain relief. PT referral.  If pain does not improve--  Will need MRI

## 2013-12-15 ENCOUNTER — Telehealth: Payer: Self-pay | Admitting: Family Medicine

## 2013-12-15 NOTE — Telephone Encounter (Signed)
She is having problems with her back.  She does not want to go to therapy

## 2013-12-15 NOTE — Telephone Encounter (Signed)
Spoke with patient and she said her back is better since leaving the office so she does not need theraphy at this time, she will call back if anything changes.    KP

## 2013-12-15 NOTE — Telephone Encounter (Signed)
We referred to PT because of the back pain

## 2013-12-15 NOTE — Telephone Encounter (Signed)
To MD for review     KP 

## 2013-12-16 ENCOUNTER — Ambulatory Visit: Payer: No Typology Code available for payment source | Admitting: Physical Therapy

## 2014-01-20 ENCOUNTER — Ambulatory Visit (INDEPENDENT_AMBULATORY_CARE_PROVIDER_SITE_OTHER): Payer: Medicare Other

## 2014-01-20 DIAGNOSIS — Z23 Encounter for immunization: Secondary | ICD-10-CM

## 2014-01-25 ENCOUNTER — Encounter: Payer: Self-pay | Admitting: Gastroenterology

## 2014-03-09 ENCOUNTER — Ambulatory Visit (AMBULATORY_SURGERY_CENTER): Payer: Self-pay

## 2014-03-09 VITALS — Ht 66.0 in | Wt 179.4 lb

## 2014-03-09 DIAGNOSIS — Z86010 Personal history of colonic polyps: Secondary | ICD-10-CM

## 2014-03-09 DIAGNOSIS — Z800 Family history of malignant neoplasm of digestive organs: Secondary | ICD-10-CM

## 2014-03-09 MED ORDER — MOVIPREP 100 G PO SOLR: ORAL | Status: DC

## 2014-03-09 NOTE — Progress Notes (Signed)
Per pt, no allergies to soy or egg products.Pt not taking any weight loss meds or using  O2 at home. 

## 2014-03-11 ENCOUNTER — Telehealth: Payer: Self-pay | Admitting: Gastroenterology

## 2014-03-12 NOTE — Telephone Encounter (Signed)
Coupon for prep placed at front desk for patient pick up. Patient advised.

## 2014-03-23 ENCOUNTER — Encounter: Payer: Self-pay | Admitting: Gastroenterology

## 2014-03-23 ENCOUNTER — Ambulatory Visit (AMBULATORY_SURGERY_CENTER): Payer: Medicare Other | Admitting: Gastroenterology

## 2014-03-23 VITALS — BP 125/79 | HR 109 | Temp 96.8°F | Resp 12 | Ht 66.0 in | Wt 179.0 lb

## 2014-03-23 DIAGNOSIS — D125 Benign neoplasm of sigmoid colon: Secondary | ICD-10-CM

## 2014-03-23 DIAGNOSIS — Z800 Family history of malignant neoplasm of digestive organs: Secondary | ICD-10-CM

## 2014-03-23 DIAGNOSIS — Z86010 Personal history of colonic polyps: Secondary | ICD-10-CM

## 2014-03-23 MED ORDER — SODIUM CHLORIDE 0.9 % IV SOLN: 500.0000 mL | INTRAVENOUS | Status: DC

## 2014-03-23 NOTE — Op Note (Signed)
[facility]  [address] [place], [id number]   COLONOSCOPY PROCEDURE REPORT  PATIENT: Candace Bell, Candace Bell  MR#: [id number] BIRTHDATE: July 20, 1950 , 63  yrs. old GENDER: female ENDOSCOPIST: Ladene Artist, MD, [facility] PROCEDURE DATE:  03/23/2014 PROCEDURE:   Colonoscopy with snare polypectomy First Screening Colonoscopy - Avg.  risk and is 50 yrs.  old or older - No.  Prior Negative Screening - Now for repeat screening. N/A  History of Adenoma - Now for follow-up colonoscopy & has been > or = to 3 yrs.  Yes hx of adenoma.  Has been 3 or more years since last colonoscopy.  Polyps Removed Today? Yes. ASA CLASS:   Class II INDICATIONS:surveillance colonoscopy based on a history of adenomatous colonic polyp(s) and patient's immediate family history of colon cancer. MEDICATIONS: Monitored anesthesia care and Propofol 200 mg IV DESCRIPTION OF PROCEDURE:   After the risks benefits and alternatives of the procedure were thoroughly explained, informed consent was obtained.  The digital rectal exam revealed no abnormalities of the rectum.   The LB [id number]  endoscope was introduced through the anus and advanced to the cecum, which was identified by both the appendix and ileocecal valve. No adverse events experienced.   The quality of the prep was excellent, using MoviPrep  The instrument was then slowly withdrawn as the colon was fully examined.    COLON FINDINGS: A sessile polyp measuring 6 mm in size was found in the sigmoid colon.  A polypectomy was performed with a cold snare. The resection was complete, the polyp tissue was completely retrieved and sent to histology.   The examination was otherwise normal.  Retroflexed views revealed no abnormalities. The time to cecum=2 minutes 12 seconds.  Withdrawal time=7 minutes 59 seconds. The scope was withdrawn and the procedure completed. COMPLICATIONS: There were no immediate complications.  ENDOSCOPIC IMPRESSION: 1.    Sessile polyp in the sigmoid colon; polypectomy performed with a cold snare 2.   The examination was otherwise normal  RECOMMENDATIONS: 1.  Await pathology results 2.  Repeat Colonoscopy in 5 years.  eSigned:  Ladene Artist, MD, [facility] 03/23/2014 8:23 AM

## 2014-03-23 NOTE — Patient Instructions (Addendum)
YOU HAD AN ENDOSCOPIC PROCEDURE TODAY AT THE [facility]: Refer to the procedure report that was given to you for any specific questions about what was found during the examination.  If the procedure report does not answer your questions, please call your gastroenterologist to clarify.  If you requested that your care partner not be given the details of your procedure findings, then the procedure report has been included in a sealed envelope for you to review at your convenience later.  YOU SHOULD EXPECT: Some feelings of bloating in the abdomen. Passage of more gas than usual.  Walking can help get rid of the air that was put into your GI tract during the procedure and reduce the bloating. If you had a lower endoscopy (such as a colonoscopy or flexible sigmoidoscopy) you may notice spotting of blood in your stool or on the toilet paper. If you underwent a bowel prep for your procedure, then you may not have a normal bowel movement for a few days.  DIET: Your first meal following the procedure should be a light meal and then it is ok to progress to your normal diet.  A half-sandwich or bowl of soup is an example of a good first meal.  Heavy or fried foods are harder to digest and may make you feel nauseous or bloated.  Likewise meals heavy in dairy and vegetables can cause extra gas to form and this can also increase the bloating.  Drink plenty of fluids but you should avoid alcoholic beverages for 24 hours.  ACTIVITY: Your care partner should take you home directly after the procedure.  You should plan to take it easy, moving slowly for the rest of the day.  You can resume normal activity the day after the procedure however you should NOT DRIVE or use heavy machinery for 24 hours (because of the sedation medicines used during the test).    SYMPTOMS TO REPORT IMMEDIATELY: A gastroenterologist can be reached at any hour.  During normal business hours, 8:30 AM to 5:00 PM Monday through Friday,  call [phone].  After hours and on weekends, please call the GI answering service at [phone] who will take a message and have the physician on call contact you.   Following lower endoscopy (colonoscopy or flexible sigmoidoscopy):  Excessive amounts of blood in the stool  Significant tenderness or worsening of abdominal pains  Swelling of the abdomen that is new, acute  Fever of 100F or higher  FOLLOW UP: If any biopsies were taken you will be contacted by phone or by letter within the next 1-3 weeks.  Call your gastroenterologist if you have not heard about the biopsies in 3 weeks.  Our staff will call the home number listed on your records the next business day following your procedure to check on you and address any questions or concerns that you may have at that time regarding the information given to you following your procedure. This is a courtesy call and so if there is no answer at the home number and we have not heard from you through the emergency physician on call, we will assume that you have returned to your regular daily activities without incident.  SIGNATURES/CONFIDENTIALITY: You and/or your care partner have signed paperwork which will be entered into your electronic medical record.  These signatures attest to the fact that that the information above on your After Visit Summary has been reviewed and is understood.  Full responsibility of the confidentiality of this   discharge information lies with you and/or your care-partner.  Recommendations Await pathology results, next colonoscopy in 5 years. Polyp handout provided to patient/care partner.

## 2014-03-23 NOTE — Progress Notes (Signed)
  [facility] Post-op Note  Patient: KYONA CHAUNCEY  Procedure(s) Performed: colonoscopy  Patient Location: [facility] - Recovery Area  Anesthesia Type: Deep Sedation/Propofol  Level of Consciousness: awake, oriented and patient cooperative  Airway and Oxygen Therapy: Patient Spontanous Breathing  Post-op Pain: none  Post-op Assessment:  Post-op Vital signs reviewed, Patient's Cardiovascular Status Stable, Respiratory Function Stable, Patent Airway, No signs of Nausea or vomiting and Pain level controlled  Post-op Vital Signs: Reviewed and stable  Complications: No apparent anesthesia complications  Dorothia Passmore E 8:24 AM

## 2014-03-23 NOTE — Progress Notes (Signed)
Called to room to assist during endoscopic procedure.  Patient ID and intended procedure confirmed with present staff. Received instructions for my participation in the procedure from the performing physician.  

## 2014-03-24 ENCOUNTER — Telehealth: Payer: Self-pay

## 2014-03-24 NOTE — Telephone Encounter (Signed)
  Follow up Call-  Call back number 03/23/2014  Post procedure Call Back phone  # [phone]  Permission to leave phone message Yes     Patient questions:  Do you have a fever, pain , or abdominal swelling? No. Pain Score  0 *  Have you tolerated food without any problems? Yes.    Have you been able to return to your normal activities? Yes.    Do you have any questions about your discharge instructions: Diet   No. Medications  No. Follow up visit  No.  Do you have questions or concerns about your Care? No.  Actions: * If pain score is 4 or above: No action needed, pain <4.

## 2014-04-02 ENCOUNTER — Encounter: Payer: Self-pay | Admitting: Gastroenterology

## 2014-04-12 DIAGNOSIS — Z01419 Encounter for gynecological examination (general) (routine) without abnormal findings: Secondary | ICD-10-CM | POA: Diagnosis not present

## 2014-04-12 DIAGNOSIS — Z0000 Encounter for general adult medical examination without abnormal findings: Secondary | ICD-10-CM | POA: Diagnosis not present

## 2014-04-12 DIAGNOSIS — R1900 Intra-abdominal and pelvic swelling, mass and lump, unspecified site: Secondary | ICD-10-CM | POA: Diagnosis not present

## 2014-07-22 ENCOUNTER — Other Ambulatory Visit: Payer: Self-pay | Admitting: Family Medicine

## 2014-07-28 ENCOUNTER — Telehealth: Payer: Self-pay | Admitting: Family Medicine

## 2014-07-28 NOTE — Telephone Encounter (Signed)
Pre Visit letter sent  °

## 2014-08-18 ENCOUNTER — Encounter: Payer: Self-pay | Admitting: *Deleted

## 2014-08-18 ENCOUNTER — Telehealth: Payer: Self-pay | Admitting: *Deleted

## 2014-08-18 NOTE — Telephone Encounter (Signed)
Pre-Visit Call completed with patient and chart updated.   Pre-Visit Info documented in Specialty Comments under [employer].    

## 2014-08-19 ENCOUNTER — Ambulatory Visit (INDEPENDENT_AMBULATORY_CARE_PROVIDER_SITE_OTHER): Payer: Medicare Other | Admitting: Family Medicine

## 2014-08-19 ENCOUNTER — Encounter: Payer: Self-pay | Admitting: Family Medicine

## 2014-08-19 VITALS — BP 122/84 | HR 60 | Temp 98.3°F | Ht 65.5 in | Wt 179.8 lb

## 2014-08-19 DIAGNOSIS — Z0000 Encounter for general adult medical examination without abnormal findings: Secondary | ICD-10-CM

## 2014-08-19 DIAGNOSIS — B009 Herpesviral infection, unspecified: Secondary | ICD-10-CM

## 2014-08-19 DIAGNOSIS — I10 Essential (primary) hypertension: Secondary | ICD-10-CM

## 2014-08-19 DIAGNOSIS — Z23 Encounter for immunization: Secondary | ICD-10-CM

## 2014-08-19 LAB — CBC WITH DIFFERENTIAL/PLATELET
BASOS PCT: 0.7 % (ref 0.0–3.0)
Basophils Absolute: 0 10*3/uL (ref 0.0–0.1)
Eosinophils Absolute: 0.1 10*3/uL (ref 0.0–0.7)
Eosinophils Relative: 2.5 % (ref 0.0–5.0)
HEMATOCRIT: 34.7 % — AB (ref 36.0–46.0)
Hemoglobin: 11.9 g/dL — ABNORMAL LOW (ref 12.0–15.0)
LYMPHS ABS: 1.7 10*3/uL (ref 0.7–4.0)
Lymphocytes Relative: 40.5 % (ref 12.0–46.0)
MCHC: 34.2 g/dL (ref 30.0–36.0)
MCV: 82 fl (ref 78.0–100.0)
MONO ABS: 0.4 10*3/uL (ref 0.1–1.0)
MONOS PCT: 8.8 % (ref 3.0–12.0)
NEUTROS ABS: 2 10*3/uL (ref 1.4–7.7)
Neutrophils Relative %: 47.5 % (ref 43.0–77.0)
Platelets: 188 10*3/uL (ref 150.0–400.0)
RBC: 4.24 Mil/uL (ref 3.87–5.11)
RDW: 13.6 % (ref 11.5–15.5)
WBC: 4.2 10*3/uL (ref 4.0–10.5)

## 2014-08-19 LAB — BASIC METABOLIC PANEL
BUN: 17 mg/dL (ref 6–23)
CO2: 27 meq/L (ref 19–32)
Calcium: 9.2 mg/dL (ref 8.4–10.5)
Chloride: 103 mEq/L (ref 96–112)
Creatinine, Ser: 0.65 mg/dL (ref 0.40–1.20)
GFR: 117.19 mL/min (ref >60.00)
Glucose, Bld: 92 mg/dL (ref 70–99)
POTASSIUM: 3.4 meq/L — AB (ref 3.5–5.1)
Sodium: 135 mEq/L (ref 135–145)

## 2014-08-19 LAB — LIPID PANEL
CHOLESTEROL: 135 mg/dL (ref 0–200)
HDL: 43.3 mg/dL (ref >39.00)
LDL Cholesterol: 81 mg/dL (ref 0–99)
NonHDL: 91.7
Total CHOL/HDL Ratio: 3
Triglycerides: 54 mg/dL (ref 0.0–149.0)
VLDL: 10.8 mg/dL (ref 0.0–40.0)

## 2014-08-19 LAB — HEPATIC FUNCTION PANEL
ALT: 15 U/L (ref 0–35)
AST: 19 U/L (ref 0–37)
Albumin: 3.9 g/dL (ref 3.5–5.2)
Alkaline Phosphatase: 58 U/L (ref 39–117)
BILIRUBIN DIRECT: 0.1 mg/dL (ref 0.0–0.3)
Total Bilirubin: 0.8 mg/dL (ref 0.2–1.2)
Total Protein: 7.4 g/dL (ref 6.0–8.3)

## 2014-08-19 LAB — TSH: TSH: 1.09 u[IU]/mL (ref 0.35–4.50)

## 2014-08-19 MED ORDER — TETANUS-DIPHTH-ACELL PERTUSSIS 5-2.5-18.5 LF-MCG/0.5 IM SUSP: 0.5000 mL | Freq: Once | INTRAMUSCULAR | Status: DC

## 2014-08-19 MED ORDER — ZOSTER VACCINE LIVE 19400 UNT/0.65ML ~~LOC~~ SOLR: 0.6500 mL | Freq: Once | SUBCUTANEOUS | Status: DC

## 2014-08-19 MED ORDER — PNEUMOCOCCAL 13-VAL CONJ VACC IM SUSP: 0.5000 mL | INTRAMUSCULAR | Status: DC

## 2014-08-19 NOTE — Progress Notes (Signed)
Pre visit review using our clinic review tool, if applicable. No additional management support is needed unless otherwise documented below in the visit note. 

## 2014-08-19 NOTE — Progress Notes (Signed)
Subjective:    Candace Bell is a 64 y.o. female who presents for Medicare Initial preventive examination.  Preventive Screening-Counseling & Management  Tobacco History  Smoking status  . Never Smoker   Smokeless tobacco  . Never Used     Problems Prior to Visit 1.   Current Problems (verified) Patient Active Problem List   Diagnosis Date Noted  . Back pain 11/25/2013  . HTN (hypertension) 02/29/2012    Medications Prior to Visit Current Outpatient Prescriptions on File Prior to Visit  Medication Sig Dispense Refill  . HYDROcodone-acetaminophen (NORCO) 7.5-325 MG per tablet Take 1 tablet by mouth every 6 (six) hours as needed for moderate pain or severe pain. 30 tablet 0  . meloxicam (MOBIC) 15 MG tablet Take 1 tablet (15 mg total) by mouth daily. (Patient taking differently: Take 15 mg by mouth as needed. ) 90 tablet 2  . methocarbamol (ROBAXIN) 500 MG tablet Take 1 tablet (500 mg total) by mouth 2 (two) times daily. 20 tablet 0  . Naproxen Sodium (ALEVE PO) Take by mouth as needed.    . valACYclovir (VALTREX) 1000 MG tablet Take 1 tablet (1,000 mg total) by mouth 2 (two) times daily. (Patient taking differently: Take 1,000 mg by mouth as needed. ) 20 tablet 2  . valsartan-hydrochlorothiazide (DIOVAN-HCT) 160-25 MG per tablet TAKE ONE TABLET BY MOUTH ONE TIME DAILY 90 tablet 0   No current facility-administered medications on file prior to visit.    Current Medications (verified) Current Outpatient Prescriptions  Medication Sig Dispense Refill  . HYDROcodone-acetaminophen (NORCO) 7.5-325 MG per tablet Take 1 tablet by mouth every 6 (six) hours as needed for moderate pain or severe pain. 30 tablet 0  . meloxicam (MOBIC) 15 MG tablet Take 1 tablet (15 mg total) by mouth daily. (Patient taking differently: Take 15 mg by mouth as needed. ) 90 tablet 2  . methocarbamol (ROBAXIN) 500 MG tablet Take 1 tablet (500 mg total) by mouth 2 (two) times daily. 20 tablet 0  .  Naproxen Sodium (ALEVE PO) Take by mouth as needed.    . valACYclovir (VALTREX) 1000 MG tablet Take 1 tablet (1,000 mg total) by mouth 2 (two) times daily. (Patient taking differently: Take 1,000 mg by mouth as needed. ) 20 tablet 2  . valsartan-hydrochlorothiazide (DIOVAN-HCT) 160-25 MG per tablet TAKE ONE TABLET BY MOUTH ONE TIME DAILY 90 tablet 0   No current facility-administered medications for this visit.     Allergies (verified) Amoxicillin; Other; and Penicillins   PAST HISTORY  Family History Family History  Problem Relation Age of Onset  . Diabetes Mother   . Colon cancer Mother   . Diabetes Brother   . Stroke Brother   . Colon polyps Sister     Social History History  Substance Use Topics  . Smoking status: Never Smoker   . Smokeless tobacco: Never Used  . Alcohol Use: No     Are there smokers in your home (other than you)? No  Risk Factors Current exercise habits: gym daily  Dietary issues discussed: na   Cardiac risk factors: advanced age (older than 57 for men, 57 for women) and hypertension.  Depression Screen (Note: if answer to either of the following is "Yes", a more complete depression screening is indicated)   Over the past 2 weeks, have you felt down, depressed or hopeless? No  Over the past 2 weeks, have you felt little interest or pleasure in doing things? No  Have you lost  interest or pleasure in daily life? No  Do you often feel hopeless? No  Do you cry easily over simple problems? No  Activities of Daily Living In your present state of health, do you have any difficulty performing the following activities?:  Driving? No Managing money?  No Feeding yourself? No Getting from bed to chair? No Climbing a flight of stairs? No Preparing food and eating?: No Bathing or showering? No Getting dressed: No Getting to the toilet? No Using the toilet:No Moving around from place to place: No In the past year have you fallen or had a near  fall?:No   Are you sexually active?  Yes  Do you have more than one partner?  No  Hearing Difficulties: No Do you often ask people to speak up or repeat themselves? No Do you experience ringing or noises in your ears? No Do you have difficulty understanding soft or whispered voices? No   Do you feel that you have a problem with memory? No  Do you often misplace items? No  Do you feel safe at home?  Yes  Cognitive Testing  Alert? Yes  Normal Appearance?Yes  Oriented to person? Yes  Place? Yes   Time? Yes  Recall of three objects?  Yes  Can perform simple calculations? Yes  Displays appropriate judgment?Yes  Can read the correct time from a watch face?Yes   Advanced Directives have been discussed with the patient? Yes  List the Names of Other Physician/Practitioners you currently use: 1. Gyn--henley 2.  opth--shapiro   Indicate any recent Medical Services you may have received from other than [facility] providers in the past year (date may be approximate).  Immunization History  Administered Date(s) Administered  . Influenza Split 02/29/2012  . Influenza,inj,Quad PF,36+ Mos 01/20/2014  . Pneumococcal Polysaccharide-23 07/31/2013    Screening Tests Health Maintenance  Topic Date Due  . TETANUS/TDAP  05/22/1967  . ZOSTAVAX  05/21/2008  . PNA vac Low Risk Adult (2 of 2 - PCV13) 08/01/2014  . INFLUENZA VACCINE  11/08/2014  . MAMMOGRAM  07/25/2015  . COLONOSCOPY  03/24/2019  . DEXA SCAN  Completed    All answers were reviewed with the patient and necessary referrals were made:  Garnet Koyanagi, DO   08/19/2014   History reviewed:  She  has a past medical history of Arthritis and Hypertension. She  does not have any pertinent problems on file. She  has past surgical history that includes Back surgery; Abdominal hysterectomy (1993); and Cataract extraction (Bilateral, 2015). Her family history includes Colon cancer in her mother; Colon polyps in her sister; Diabetes in her  brother and mother; Stroke in her brother. She  reports that she has never smoked. She has never used smokeless tobacco. She reports that she does not drink alcohol or use illicit drugs. She has a current medication list which includes the following prescription(s): hydrocodone-acetaminophen, meloxicam, methocarbamol, naproxen sodium, valacyclovir, and valsartan-hydrochlorothiazide. Current Outpatient Prescriptions on File Prior to Visit  Medication Sig Dispense Refill  . HYDROcodone-acetaminophen (NORCO) 7.5-325 MG per tablet Take 1 tablet by mouth every 6 (six) hours as needed for moderate pain or severe pain. 30 tablet 0  . meloxicam (MOBIC) 15 MG tablet Take 1 tablet (15 mg total) by mouth daily. (Patient taking differently: Take 15 mg by mouth as needed. ) 90 tablet 2  . methocarbamol (ROBAXIN) 500 MG tablet Take 1 tablet (500 mg total) by mouth 2 (two) times daily. 20 tablet 0  . Naproxen Sodium (ALEVE PO)  Take by mouth as needed.    . valACYclovir (VALTREX) 1000 MG tablet Take 1 tablet (1,000 mg total) by mouth 2 (two) times daily. (Patient taking differently: Take 1,000 mg by mouth as needed. ) 20 tablet 2  . valsartan-hydrochlorothiazide (DIOVAN-HCT) 160-25 MG per tablet TAKE ONE TABLET BY MOUTH ONE TIME DAILY 90 tablet 0   No current facility-administered medications on file prior to visit.   She is allergic to amoxicillin; other; and penicillins.  Review of Systems  Review of Systems  Constitutional: Negative for activity change, appetite change and fatigue.  HENT: Negative for hearing loss, congestion, tinnitus and ear discharge.   Eyes: Negative for visual disturbance (see optho q1y -- vision corrected to 20/20 with glasses).  Respiratory: Negative for cough, chest tightness and shortness of breath.   Cardiovascular: Negative for chest pain, palpitations and leg swelling.  Gastrointestinal: Negative for abdominal pain, diarrhea, constipation and abdominal distention.   Genitourinary: Negative for urgency, frequency, decreased urine volume and difficulty urinating.  Musculoskeletal: Negative for back pain, arthralgias and gait problem.  Skin: Negative for color change, pallor and rash.  Neurological: Negative for dizziness, light-headedness, numbness and headaches.  Hematological: Negative for adenopathy. Does not bruise/bleed easily.  Psychiatric/Behavioral: Negative for suicidal ideas, confusion, sleep disturbance, self-injury, dysphoric mood, decreased concentration and agitation.  Pt is able to read and write and can do all ADLs No risk for falling No abuse/ violence in home      Objective:     Vision by Snellen chart: opth Body mass index is 29.45 kg/(m^2). BP 122/84 mmHg  Pulse 60  Temp(Src) 98.3 F (36.8 C) (Oral)  Ht 5' 5.5" (1.664 m)  Wt 179 lb 12.8 oz (81.557 kg)  BMI 29.45 kg/m2  SpO2 99%  BP 122/84 mmHg  Pulse 60  Temp(Src) 98.3 F (36.8 C) (Oral)  Ht 5' 5.5" (1.664 m)  Wt 179 lb 12.8 oz (81.557 kg)  BMI 29.45 kg/m2  SpO2 99% General appearance: alert, cooperative, appears stated age and no distress Head: Normocephalic, without obvious abnormality, atraumatic Eyes: conjunctivae/corneas clear. PERRL, EOM's intact. Fundi benign. Ears: normal TM's and external ear canals both ears Nose: Nares normal. Septum midline. Mucosa normal. No drainage or sinus tenderness. Throat: lips, mucosa, and tongue normal; teeth and gums normal Neck: no adenopathy, no carotid bruit, no JVD, supple, symmetrical, trachea midline and thyroid not enlarged, symmetric, no tenderness/mass/nodules Back: symmetric, no curvature. ROM normal. No CVA tenderness. Lungs: clear to auscultation bilaterally Breasts: normal appearance, no masses or tenderness Heart: regular rate and rhythm, S1, S2 normal, no murmur, click, rub or gallop Abdomen: soft, non-tender; bowel sounds normal; no masses,  no organomegaly Pelvic: deferred --gyn Extremities: extremities  normal, atraumatic, no cyanosis or edema Pulses: 2+ and symmetric Skin: Skin color, texture, turgor normal. No rashes or lesions Lymph nodes: Cervical, supraclavicular, and axillary nodes normal. Neurologic: Alert and oriented X 3, normal strength and tone. Normal symmetric reflexes. Normal coordination and gait Psych--no anxiety, no depression      Assessment:     cpe      Plan:     During the course of the visit the patient was educated and counseled about appropriate screening and preventive services including:    Pneumococcal vaccine   Influenza vaccine  Screening mammography  Screening Pap smear and pelvic exam   Bone densitometry screening  Colorectal cancer screening  Diabetes screening  Glaucoma screening  Advanced directives: has an advanced directive - a copy HAS NOT been provided.  Diet review for nutrition referral? Yes ____  Not Indicated __x_   Patient Instructions (the written plan) was given to the patient.  Medicare Attestation I have personally reviewed: The patient's medical and social history Their use of alcohol, tobacco or illicit drugs Their current medications and supplements The patient's functional ability including ADLs,fall risks, home safety risks, cognitive, and hearing and visual impairment Diet and physical activities Evidence for depression or mood disorders  The patient's weight, height, BMI, and visual acuity have been recorded in the chart.  I have made referrals, counseling, and provided education to the patient based on review of the above and I have provided the patient with a written personalized care plan for preventive services.   1. Preventative health care  - Basic metabolic panel - CBC with Differential/Platelet - Hepatic function panel - Lipid panel - Microalbumin / creatinine urine ratio - POCT urinalysis dipstick - TSH  2. Essential hypertension  - Basic metabolic panel - CBC with  Differential/Platelet - Hepatic function panel  3. HSV-1 infection   4. Need for Tdap vaccination  - Tdap ([place]) 5-2.5-18.5 LF-MCG/0.5 injection; Inject 0.5 mLs into the muscle once.  Dispense: 0.5 mL; Refill: 0  5. Need for pneumococcal vaccination  - pneumococcal 13-valent conjugate vaccine (PREVNAR 13) SUSP injection; Inject 0.5 mLs into the muscle tomorrow at 10 am.  Dispense: 0.5 mL; Refill: 0  6. Need for shingles vaccine  - zoster vaccine live, PF, (ZOSTAVAX) 69485 UNT/0.65ML injection; Inject 19,400 Units into the skin once.  Dispense: 1 vial; Refill: 0  7. Routine history and physical examination of adult   Garnet Koyanagi, DO   08/19/2014

## 2014-08-19 NOTE — Patient Instructions (Signed)
Preventive Care for Adults A healthy lifestyle and preventive care can promote health and wellness. Preventive health guidelines for women include the following key practices.  A routine yearly physical is a good way to check with your health care provider about your health and preventive screening. It is a chance to share any concerns and updates on your health and to receive a thorough exam.  Visit your dentist for a routine exam and preventive care every 6 months. Brush your teeth twice a day and floss once a day. Good oral hygiene prevents tooth decay and gum disease.  The frequency of eye exams is based on your age, health, family medical history, use of contact lenses, and other factors. Follow your health care provider's recommendations for frequency of eye exams.  Eat a healthy diet. Foods like vegetables, fruits, whole grains, low-fat dairy products, and lean protein foods contain the nutrients you need without too many calories. Decrease your intake of foods high in solid fats, added sugars, and salt. Eat the right amount of calories for you.Get information about a proper diet from your health care provider, if necessary.  Regular physical exercise is one of the most important things you can do for your health. Most adults should get at least 150 minutes of moderate-intensity exercise (any activity that increases your heart rate and causes you to sweat) each week. In addition, most adults need muscle-strengthening exercises on 2 or more days a week.  Maintain a healthy weight. The body mass index (BMI) is a screening tool to identify possible weight problems. It provides an estimate of body fat based on height and weight. Your health care provider can find your BMI and can help you achieve or maintain a healthy weight.For adults 20 years and older:  A BMI below 18.5 is considered underweight.  A BMI of 18.5 to 24.9 is normal.  A BMI of 25 to 29.9 is considered overweight.  A BMI of  30 and above is considered obese.  Maintain normal blood lipids and cholesterol levels by exercising and minimizing your intake of saturated fat. Eat a balanced diet with plenty of fruit and vegetables. Blood tests for lipids and cholesterol should begin at age 76 and be repeated every 5 years. If your lipid or cholesterol levels are high, you are over 50, or you are at high risk for heart disease, you may need your cholesterol levels checked more frequently.Ongoing high lipid and cholesterol levels should be treated with medicines if diet and exercise are not working.  If you smoke, find out from your health care provider how to quit. If you do not use tobacco, do not start.  Lung cancer screening is recommended for adults aged 22-80 years who are at high risk for developing lung cancer because of a history of smoking. A yearly low-dose CT scan of the lungs is recommended for people who have at least a 30-pack-year history of smoking and are a current smoker or have quit within the past 15 years. A pack year of smoking is smoking an average of 1 pack of cigarettes a day for 1 year (for example: 1 pack a day for 30 years or 2 packs a day for 15 years). Yearly screening should continue until the smoker has stopped smoking for at least 15 years. Yearly screening should be stopped for people who develop a health problem that would prevent them from having lung cancer treatment.  If you are pregnant, do not drink alcohol. If you are breastfeeding,  be very cautious about drinking alcohol. If you are not pregnant and choose to drink alcohol, do not have more than 1 drink per day. One drink is considered to be 12 ounces (355 mL) of beer, 5 ounces (148 mL) of wine, or 1.5 ounces (44 mL) of liquor.  Avoid use of street drugs. Do not share needles with anyone. Ask for help if you need support or instructions about stopping the use of drugs.  High blood pressure causes heart disease and increases the risk of  stroke. Your blood pressure should be checked at least every 1 to 2 years. Ongoing high blood pressure should be treated with medicines if weight loss and exercise do not work.  If you are 75-52 years old, ask your health care provider if you should take aspirin to prevent strokes.  Diabetes screening involves taking a blood sample to check your fasting blood sugar level. This should be done once every 3 years, after age 15, if you are within normal weight and without risk factors for diabetes. Testing should be considered at a younger age or be carried out more frequently if you are overweight and have at least 1 risk factor for diabetes.  Breast cancer screening is essential preventive care for women. You should practice "breast self-awareness." This means understanding the normal appearance and feel of your breasts and may include breast self-examination. Any changes detected, no matter how small, should be reported to a health care provider. Women in their 58s and 30s should have a clinical breast exam (CBE) by a health care provider as part of a regular health exam every 1 to 3 years. After age 16, women should have a CBE every year. Starting at age 53, women should consider having a mammogram (breast X-ray test) every year. Women who have a family history of breast cancer should talk to their health care provider about genetic screening. Women at a high risk of breast cancer should talk to their health care providers about having an MRI and a mammogram every year.  Breast cancer gene (BRCA)-related cancer risk assessment is recommended for women who have family members with BRCA-related cancers. BRCA-related cancers include breast, ovarian, tubal, and peritoneal cancers. Having family members with these cancers may be associated with an increased risk for harmful changes (mutations) in the breast cancer genes BRCA1 and BRCA2. Results of the assessment will determine the need for genetic counseling and  BRCA1 and BRCA2 testing.  Routine pelvic exams to screen for cancer are no longer recommended for nonpregnant women who are considered low risk for cancer of the pelvic organs (ovaries, uterus, and vagina) and who do not have symptoms. Ask your health care provider if a screening pelvic exam is right for you.  If you have had past treatment for cervical cancer or a condition that could lead to cancer, you need Pap tests and screening for cancer for at least 20 years after your treatment. If Pap tests have been discontinued, your risk factors (such as having a new sexual partner) need to be reassessed to determine if screening should be resumed. Some women have medical problems that increase the chance of getting cervical cancer. In these cases, your health care provider may recommend more frequent screening and Pap tests.  The HPV test is an additional test that may be used for cervical cancer screening. The HPV test looks for the virus that can cause the cell changes on the cervix. The cells collected during the Pap test can be  tested for HPV. The HPV test could be used to screen women aged 30 years and older, and should be used in women of any age who have unclear Pap test results. After the age of 30, women should have HPV testing at the same frequency as a Pap test.  Colorectal cancer can be detected and often prevented. Most routine colorectal cancer screening begins at the age of 50 years and continues through age 75 years. However, your health care provider may recommend screening at an earlier age if you have risk factors for colon cancer. On a yearly basis, your health care provider may provide home test kits to check for hidden blood in the stool. Use of a small camera at the end of a tube, to directly examine the colon (sigmoidoscopy or colonoscopy), can detect the earliest forms of colorectal cancer. Talk to your health care provider about this at age 50, when routine screening begins. Direct  exam of the colon should be repeated every 5-10 years through age 75 years, unless early forms of pre-cancerous polyps or small growths are found.  People who are at an increased risk for hepatitis B should be screened for this virus. You are considered at high risk for hepatitis B if:  You were born in a country where hepatitis B occurs often. Talk with your health care provider about which countries are considered high risk.  Your parents were born in a high-risk country and you have not received a shot to protect against hepatitis B (hepatitis B vaccine).  You have HIV or AIDS.  You use needles to inject street drugs.  You live with, or have sex with, someone who has hepatitis B.  You get hemodialysis treatment.  You take certain medicines for conditions like cancer, organ transplantation, and autoimmune conditions.  Hepatitis C blood testing is recommended for all people born from 1945 through 1965 and any individual with known risks for hepatitis C.  Practice safe sex. Use condoms and avoid high-risk sexual practices to reduce the spread of sexually transmitted infections (STIs). STIs include gonorrhea, chlamydia, syphilis, trichomonas, herpes, HPV, and human immunodeficiency virus (HIV). Herpes, HIV, and HPV are viral illnesses that have no cure. They can result in disability, cancer, and death.  You should be screened for sexually transmitted illnesses (STIs) including gonorrhea and chlamydia if:  You are sexually active and are younger than 24 years.  You are older than 24 years and your health care provider tells you that you are at risk for this type of infection.  Your sexual activity has changed since you were last screened and you are at an increased risk for chlamydia or gonorrhea. Ask your health care provider if you are at risk.  If you are at risk of being infected with HIV, it is recommended that you take a prescription medicine daily to prevent HIV infection. This is  called preexposure prophylaxis (PrEP). You are considered at risk if:  You are a heterosexual woman, are sexually active, and are at increased risk for HIV infection.  You take drugs by injection.  You are sexually active with a partner who has HIV.  Talk with your health care provider about whether you are at high risk of being infected with HIV. If you choose to begin PrEP, you should first be tested for HIV. You should then be tested every 3 months for as long as you are taking PrEP.  Osteoporosis is a disease in which the bones lose minerals and strength   with aging. This can result in serious bone fractures or breaks. The risk of osteoporosis can be identified using a bone density scan. Women ages 65 years and over and women at risk for fractures or osteoporosis should discuss screening with their health care providers. Ask your health care provider whether you should take a calcium supplement or vitamin D to reduce the rate of osteoporosis.  Menopause can be associated with physical symptoms and risks. Hormone replacement therapy is available to decrease symptoms and risks. You should talk to your health care provider about whether hormone replacement therapy is right for you.  Use sunscreen. Apply sunscreen liberally and repeatedly throughout the day. You should seek shade when your shadow is shorter than you. Protect yourself by wearing long sleeves, pants, a wide-brimmed hat, and sunglasses year round, whenever you are outdoors.  Once a month, do a whole body skin exam, using a mirror to look at the skin on your back. Tell your health care provider of new moles, moles that have irregular borders, moles that are larger than a pencil eraser, or moles that have changed in shape or color.  Stay current with required vaccines (immunizations).  Influenza vaccine. All adults should be immunized every year.  Tetanus, diphtheria, and acellular pertussis (Td, Tdap) vaccine. Pregnant women should  receive 1 dose of Tdap vaccine during each pregnancy. The dose should be obtained regardless of the length of time since the last dose. Immunization is preferred during the 27th-36th week of gestation. An adult who has not previously received Tdap or who does not know her vaccine status should receive 1 dose of Tdap. This initial dose should be followed by tetanus and diphtheria toxoids (Td) booster doses every 10 years. Adults with an unknown or incomplete history of completing a 3-dose immunization series with Td-containing vaccines should begin or complete a primary immunization series including a Tdap dose. Adults should receive a Td booster every 10 years.  Varicella vaccine. An adult without evidence of immunity to varicella should receive 2 doses or a second dose if she has previously received 1 dose. Pregnant females who do not have evidence of immunity should receive the first dose after pregnancy. This first dose should be obtained before leaving the health care facility. The second dose should be obtained 4-8 weeks after the first dose.  Human papillomavirus (HPV) vaccine. Females aged 13-26 years who have not received the vaccine previously should obtain the 3-dose series. The vaccine is not recommended for use in pregnant females. However, pregnancy testing is not needed before receiving a dose. If a female is found to be pregnant after receiving a dose, no treatment is needed. In that case, the remaining doses should be delayed until after the pregnancy. Immunization is recommended for any person with an immunocompromised condition through the age of 26 years if she did not get any or all doses earlier. During the 3-dose series, the second dose should be obtained 4-8 weeks after the first dose. The third dose should be obtained 24 weeks after the first dose and 16 weeks after the second dose.  Zoster vaccine. One dose is recommended for adults aged 60 years or older unless certain conditions are  present.  Measles, mumps, and rubella (MMR) vaccine. Adults born before 1957 generally are considered immune to measles and mumps. Adults born in 1957 or later should have 1 or more doses of MMR vaccine unless there is a contraindication to the vaccine or there is laboratory evidence of immunity to   each of the three diseases. A routine second dose of MMR vaccine should be obtained at least 28 days after the first dose for students attending postsecondary schools, health care workers, or international travelers. People who received inactivated measles vaccine or an unknown type of measles vaccine during 1963-1967 should receive 2 doses of MMR vaccine. People who received inactivated mumps vaccine or an unknown type of mumps vaccine before 1979 and are at high risk for mumps infection should consider immunization with 2 doses of MMR vaccine. For females of childbearing age, rubella immunity should be determined. If there is no evidence of immunity, females who are not pregnant should be vaccinated. If there is no evidence of immunity, females who are pregnant should delay immunization until after pregnancy. Unvaccinated health care workers born before 1957 who lack laboratory evidence of measles, mumps, or rubella immunity or laboratory confirmation of disease should consider measles and mumps immunization with 2 doses of MMR vaccine or rubella immunization with 1 dose of MMR vaccine.  Pneumococcal 13-valent conjugate (PCV13) vaccine. When indicated, a person who is uncertain of her immunization history and has no record of immunization should receive the PCV13 vaccine. An adult aged 19 years or older who has certain medical conditions and has not been previously immunized should receive 1 dose of PCV13 vaccine. This PCV13 should be followed with a dose of pneumococcal polysaccharide (PPSV23) vaccine. The PPSV23 vaccine dose should be obtained at least 8 weeks after the dose of PCV13 vaccine. An adult aged 19  years or older who has certain medical conditions and previously received 1 or more doses of PPSV23 vaccine should receive 1 dose of PCV13. The PCV13 vaccine dose should be obtained 1 or more years after the last PPSV23 vaccine dose.  Pneumococcal polysaccharide (PPSV23) vaccine. When PCV13 is also indicated, PCV13 should be obtained first. All adults aged 65 years and older should be immunized. An adult younger than age 65 years who has certain medical conditions should be immunized. Any person who resides in a nursing home or long-term care facility should be immunized. An adult smoker should be immunized. People with an immunocompromised condition and certain other conditions should receive both PCV13 and PPSV23 vaccines. People with human immunodeficiency virus (HIV) infection should be immunized as soon as possible after diagnosis. Immunization during chemotherapy or radiation therapy should be avoided. Routine use of PPSV23 vaccine is not recommended for American Indians, [place] Natives, or people younger than 65 years unless there are medical conditions that require PPSV23 vaccine. When indicated, people who have unknown immunization and have no record of immunization should receive PPSV23 vaccine. One-time revaccination 5 years after the first dose of PPSV23 is recommended for people aged 19-64 years who have chronic kidney failure, nephrotic syndrome, asplenia, or immunocompromised conditions. People who received 1-2 doses of PPSV23 before age 65 years should receive another dose of PPSV23 vaccine at age 65 years or later if at least 5 years have passed since the previous dose. Doses of PPSV23 are not needed for people immunized with PPSV23 at or after age 65 years.  Meningococcal vaccine. Adults with asplenia or persistent complement component deficiencies should receive 2 doses of quadrivalent meningococcal conjugate (MenACWY-D) vaccine. The doses should be obtained at least 2 months apart.  Microbiologists working with certain meningococcal bacteria, military recruits, people at risk during an outbreak, and people who travel to or live in countries with a high rate of meningitis should be immunized. A first-year college student up through age   21 years who is living in a residence hall should receive a dose if she did not receive a dose on or after her 16th birthday. Adults who have certain high-risk conditions should receive one or more doses of vaccine.  Hepatitis A vaccine. Adults who wish to be protected from this disease, have certain high-risk conditions, work with hepatitis A-infected animals, work in hepatitis A research labs, or travel to or work in countries with a high rate of hepatitis A should be immunized. Adults who were previously unvaccinated and who anticipate close contact with an international adoptee during the first 60 days after arrival in the [place] from a country with a high rate of hepatitis A should be immunized.  Hepatitis B vaccine. Adults who wish to be protected from this disease, have certain high-risk conditions, may be exposed to blood or other infectious body fluids, are household contacts or sex partners of hepatitis B positive people, are clients or workers in certain care facilities, or travel to or work in countries with a high rate of hepatitis B should be immunized.  Haemophilus influenzae type b (Hib) vaccine. A previously unvaccinated person with asplenia or sickle cell disease or having a scheduled splenectomy should receive 1 dose of Hib vaccine. Regardless of previous immunization, a recipient of a hematopoietic stem cell transplant should receive a 3-dose series 6-12 months after her successful transplant. Hib vaccine is not recommended for adults with HIV infection. Preventive Services / Frequency Ages 64 to 68 years  Blood pressure check.** / Every 1 to 2 years.  Lipid and cholesterol check.** / Every 5 years beginning at age  22.  Clinical breast exam.** / Every 3 years for women in their 88s and 53s.  BRCA-related cancer risk assessment.** / For women who have family members with a BRCA-related cancer (breast, ovarian, tubal, or peritoneal cancers).  Pap test.** / Every 2 years from ages 90 through 51. Every 3 years starting at age 21 through age 56 or 3 with a history of 3 consecutive normal Pap tests.  HPV screening.** / Every 3 years from ages 24 through ages 1 to 46 with a history of 3 consecutive normal Pap tests.  Hepatitis C blood test.** / For any individual with known risks for hepatitis C.  Skin self-exam. / Monthly.  Influenza vaccine. / Every year.  Tetanus, diphtheria, and acellular pertussis (Tdap, Td) vaccine.** / Consult your health care provider. Pregnant women should receive 1 dose of Tdap vaccine during each pregnancy. 1 dose of Td every 10 years.  Varicella vaccine.** / Consult your health care provider. Pregnant females who do not have evidence of immunity should receive the first dose after pregnancy.  HPV vaccine. / 3 doses over 6 months, if 72 and younger. The vaccine is not recommended for use in pregnant females. However, pregnancy testing is not needed before receiving a dose.  Measles, mumps, rubella (MMR) vaccine.** / You need at least 1 dose of MMR if you were born in 1957 or later. You may also need a 2nd dose. For females of childbearing age, rubella immunity should be determined. If there is no evidence of immunity, females who are not pregnant should be vaccinated. If there is no evidence of immunity, females who are pregnant should delay immunization until after pregnancy.  Pneumococcal 13-valent conjugate (PCV13) vaccine.** / Consult your health care provider.  Pneumococcal polysaccharide (PPSV23) vaccine.** / 1 to 2 doses if you smoke cigarettes or if you have certain conditions.  Meningococcal vaccine.** /  1 dose if you are age 19 to 21 years and a first-year college  student living in a residence hall, or have one of several medical conditions, you need to get vaccinated against meningococcal disease. You may also need additional booster doses.  Hepatitis A vaccine.** / Consult your health care provider.  Hepatitis B vaccine.** / Consult your health care provider.  Haemophilus influenzae type b (Hib) vaccine.** / Consult your health care provider. Ages 40 to 64 years  Blood pressure check.** / Every 1 to 2 years.  Lipid and cholesterol check.** / Every 5 years beginning at age 20 years.  Lung cancer screening. / Every year if you are aged 55-80 years and have a 30-pack-year history of smoking and currently smoke or have quit within the past 15 years. Yearly screening is stopped once you have quit smoking for at least 15 years or develop a health problem that would prevent you from having lung cancer treatment.  Clinical breast exam.** / Every year after age 40 years.  BRCA-related cancer risk assessment.** / For women who have family members with a BRCA-related cancer (breast, ovarian, tubal, or peritoneal cancers).  Mammogram.** / Every year beginning at age 40 years and continuing for as long as you are in good health. Consult with your health care provider.  Pap test.** / Every 3 years starting at age 30 years through age 65 or 70 years with a history of 3 consecutive normal Pap tests.  HPV screening.** / Every 3 years from ages 30 years through ages 65 to 70 years with a history of 3 consecutive normal Pap tests.  Fecal occult blood test (FOBT) of stool. / Every year beginning at age 50 years and continuing until age 75 years. You may not need to do this test if you get a colonoscopy every 10 years.  Flexible sigmoidoscopy or colonoscopy.** / Every 5 years for a flexible sigmoidoscopy or every 10 years for a colonoscopy beginning at age 50 years and continuing until age 75 years.  Hepatitis C blood test.** / For all people born from 1945 through  1965 and any individual with known risks for hepatitis C.  Skin self-exam. / Monthly.  Influenza vaccine. / Every year.  Tetanus, diphtheria, and acellular pertussis (Tdap/Td) vaccine.** / Consult your health care provider. Pregnant women should receive 1 dose of Tdap vaccine during each pregnancy. 1 dose of Td every 10 years.  Varicella vaccine.** / Consult your health care provider. Pregnant females who do not have evidence of immunity should receive the first dose after pregnancy.  Zoster vaccine.** / 1 dose for adults aged 60 years or older.  Measles, mumps, rubella (MMR) vaccine.** / You need at least 1 dose of MMR if you were born in 1957 or later. You may also need a 2nd dose. For females of childbearing age, rubella immunity should be determined. If there is no evidence of immunity, females who are not pregnant should be vaccinated. If there is no evidence of immunity, females who are pregnant should delay immunization until after pregnancy.  Pneumococcal 13-valent conjugate (PCV13) vaccine.** / Consult your health care provider.  Pneumococcal polysaccharide (PPSV23) vaccine.** / 1 to 2 doses if you smoke cigarettes or if you have certain conditions.  Meningococcal vaccine.** / Consult your health care provider.  Hepatitis A vaccine.** / Consult your health care provider.  Hepatitis B vaccine.** / Consult your health care provider.  Haemophilus influenzae type b (Hib) vaccine.** / Consult your health care provider. Ages 65   years and over  Blood pressure check.** / Every 1 to 2 years.  Lipid and cholesterol check.** / Every 5 years beginning at age 22 years.  Lung cancer screening. / Every year if you are aged 73-80 years and have a 30-pack-year history of smoking and currently smoke or have quit within the past 15 years. Yearly screening is stopped once you have quit smoking for at least 15 years or develop a health problem that would prevent you from having lung cancer  treatment.  Clinical breast exam.** / Every year after age 4 years.  BRCA-related cancer risk assessment.** / For women who have family members with a BRCA-related cancer (breast, ovarian, tubal, or peritoneal cancers).  Mammogram.** / Every year beginning at age 40 years and continuing for as long as you are in good health. Consult with your health care provider.  Pap test.** / Every 3 years starting at age 9 years through age 34 or 91 years with 3 consecutive normal Pap tests. Testing can be stopped between 65 and 70 years with 3 consecutive normal Pap tests and no abnormal Pap or HPV tests in the past 10 years.  HPV screening.** / Every 3 years from ages 57 years through ages 64 or 45 years with a history of 3 consecutive normal Pap tests. Testing can be stopped between 65 and 70 years with 3 consecutive normal Pap tests and no abnormal Pap or HPV tests in the past 10 years.  Fecal occult blood test (FOBT) of stool. / Every year beginning at age 15 years and continuing until age 17 years. You may not need to do this test if you get a colonoscopy every 10 years.  Flexible sigmoidoscopy or colonoscopy.** / Every 5 years for a flexible sigmoidoscopy or every 10 years for a colonoscopy beginning at age 86 years and continuing until age 71 years.  Hepatitis C blood test.** / For all people born from [birth year] through 1967 and any individual with known risks for hepatitis C.  Osteoporosis screening.** / A one-time screening for women ages 83 years and over and women at risk for fractures or osteoporosis.  Skin self-exam. / Monthly.  Influenza vaccine. / Every year.  Tetanus, diphtheria, and acellular pertussis (Tdap/Td) vaccine.** / 1 dose of Td every 10 years.  Varicella vaccine.** / Consult your health care provider.  Zoster vaccine.** / 1 dose for adults aged 61 years or older.  Pneumococcal 13-valent conjugate (PCV13) vaccine.** / Consult your health care provider.  Pneumococcal  polysaccharide (PPSV23) vaccine.** / 1 dose for all adults aged 28 years and older.  Meningococcal vaccine.** / Consult your health care provider.  Hepatitis A vaccine.** / Consult your health care provider.  Hepatitis B vaccine.** / Consult your health care provider.  Haemophilus influenzae type b (Hib) vaccine.** / Consult your health care provider. ** Family history and personal history of risk and conditions may change your health care provider's recommendations. Document Released: 05/22/2001 Document Revised: 08/10/2013 Document Reviewed: 08/21/2010 [facility] Patient Information 2015 [place], [place]. This information is not intended to replace advice given to you by your health care provider. Make sure you discuss any questions you have with your health care provider.

## 2014-08-20 LAB — POCT URINALYSIS DIPSTICK
BILIRUBIN UA: NEGATIVE
Blood, UA: NEGATIVE
KETONES UA: NEGATIVE
Leukocytes, UA: NEGATIVE
NITRITE UA: NEGATIVE
PROTEIN UA: NEGATIVE
SPEC GRAV UA: 1.025
Urobilinogen, UA: 4
pH, UA: 5.5

## 2014-08-20 LAB — MICROALBUMIN / CREATININE URINE RATIO
Creatinine,U: 81 mg/dL
MICROALB/CREAT RATIO: 0.9 mg/g (ref 0.0–30.0)
Microalb, Ur: <0.7 mg/dL (ref 0.0–1.9)

## 2014-08-31 DIAGNOSIS — Z1231 Encounter for screening mammogram for malignant neoplasm of breast: Secondary | ICD-10-CM | POA: Diagnosis not present

## 2014-09-21 ENCOUNTER — Encounter: Payer: Self-pay | Admitting: Family Medicine

## 2014-11-22 ENCOUNTER — Other Ambulatory Visit: Payer: Self-pay | Admitting: Family Medicine

## 2015-01-11 ENCOUNTER — Ambulatory Visit (INDEPENDENT_AMBULATORY_CARE_PROVIDER_SITE_OTHER): Payer: Medicare Other

## 2015-01-11 DIAGNOSIS — Z23 Encounter for immunization: Secondary | ICD-10-CM | POA: Diagnosis not present

## 2015-01-11 NOTE — Progress Notes (Signed)
Pre visit review using our clinic review tool, if applicable. No additional management support is needed unless otherwise documented below in the visit note. 

## 2015-05-25 ENCOUNTER — Other Ambulatory Visit: Payer: Self-pay | Admitting: Family Medicine

## 2015-06-21 ENCOUNTER — Other Ambulatory Visit: Payer: Self-pay

## 2015-06-21 DIAGNOSIS — M1710 Unilateral primary osteoarthritis, unspecified knee: Secondary | ICD-10-CM

## 2015-06-21 DIAGNOSIS — M179 Osteoarthritis of knee, unspecified: Secondary | ICD-10-CM

## 2015-06-21 MED ORDER — MELOXICAM 15 MG PO TABS: 15.0000 mg | ORAL_TABLET | Freq: Every day | ORAL | Status: DC

## 2015-06-28 DIAGNOSIS — Z01419 Encounter for gynecological examination (general) (routine) without abnormal findings: Secondary | ICD-10-CM | POA: Diagnosis not present

## 2015-08-07 DIAGNOSIS — H524 Presbyopia: Secondary | ICD-10-CM | POA: Diagnosis not present

## 2015-08-23 ENCOUNTER — Other Ambulatory Visit: Payer: Self-pay | Admitting: Family Medicine

## 2015-09-19 ENCOUNTER — Encounter: Payer: Medicare Other | Admitting: Family Medicine

## 2015-09-19 DIAGNOSIS — H26493 Other secondary cataract, bilateral: Secondary | ICD-10-CM | POA: Diagnosis not present

## 2015-09-21 DIAGNOSIS — Z780 Asymptomatic menopausal state: Secondary | ICD-10-CM | POA: Diagnosis not present

## 2015-09-21 DIAGNOSIS — M85851 Other specified disorders of bone density and structure, right thigh: Secondary | ICD-10-CM | POA: Diagnosis not present

## 2015-09-21 DIAGNOSIS — Z1231 Encounter for screening mammogram for malignant neoplasm of breast: Secondary | ICD-10-CM | POA: Diagnosis not present

## 2015-09-21 LAB — HM DEXA SCAN

## 2015-09-21 LAB — HM MAMMOGRAPHY

## 2015-10-03 ENCOUNTER — Encounter: Payer: Self-pay | Admitting: Family Medicine

## 2015-10-03 ENCOUNTER — Ambulatory Visit (INDEPENDENT_AMBULATORY_CARE_PROVIDER_SITE_OTHER): Payer: Medicare Other | Admitting: Family Medicine

## 2015-10-03 VITALS — BP 140/88 | HR 77 | Temp 98.2°F | Ht 65.5 in | Wt 179.4 lb

## 2015-10-03 DIAGNOSIS — M1710 Unilateral primary osteoarthritis, unspecified knee: Secondary | ICD-10-CM

## 2015-10-03 DIAGNOSIS — Z0000 Encounter for general adult medical examination without abnormal findings: Secondary | ICD-10-CM

## 2015-10-03 DIAGNOSIS — I10 Essential (primary) hypertension: Secondary | ICD-10-CM | POA: Diagnosis not present

## 2015-10-03 DIAGNOSIS — M179 Osteoarthritis of knee, unspecified: Secondary | ICD-10-CM | POA: Diagnosis not present

## 2015-10-03 DIAGNOSIS — Z8619 Personal history of other infectious and parasitic diseases: Secondary | ICD-10-CM | POA: Diagnosis not present

## 2015-10-03 MED ORDER — MELOXICAM 15 MG PO TABS: 15.0000 mg | ORAL_TABLET | Freq: Every day | ORAL | Status: DC

## 2015-10-03 MED ORDER — VALSARTAN-HYDROCHLOROTHIAZIDE 160-25 MG PO TABS: ORAL_TABLET | ORAL | Status: DC

## 2015-10-03 MED ORDER — VALACYCLOVIR HCL 1 G PO TABS
1000.0000 mg | ORAL_TABLET | Freq: Two times a day (BID) | ORAL | Status: DC
Start: 2015-10-03 — End: 2018-01-24

## 2015-10-03 NOTE — Progress Notes (Signed)
Pre visit review using our clinic review tool, if applicable. No additional management support is needed unless otherwise documented below in the visit note. 

## 2015-10-03 NOTE — Patient Instructions (Signed)
Preventive Care for Adults, Female A healthy lifestyle and preventive care can promote health and wellness. Preventive health guidelines for women include the following key practices.  A routine yearly physical is a good way to check with your health care provider about your health and preventive screening. It is a chance to share any concerns and updates on your health and to receive a thorough exam.  Visit your dentist for a routine exam and preventive care every 6 months. Brush your teeth twice a day and floss once a day. Good oral hygiene prevents tooth decay and gum disease.  The frequency of eye exams is based on your age, health, family medical history, use of contact lenses, and other factors. Follow your health care provider's recommendations for frequency of eye exams.  Eat a healthy diet. Foods like vegetables, fruits, whole grains, low-fat dairy products, and lean protein foods contain the nutrients you need without too many calories. Decrease your intake of foods high in solid fats, added sugars, and salt. Eat the right amount of calories for you.Get information about a proper diet from your health care provider, if necessary.  Regular physical exercise is one of the most important things you can do for your health. Most adults should get at least 150 minutes of moderate-intensity exercise (any activity that increases your heart rate and causes you to sweat) each week. In addition, most adults need muscle-strengthening exercises on 2 or more days a week.  Maintain a healthy weight. The body mass index (BMI) is a screening tool to identify possible weight problems. It provides an estimate of body fat based on height and weight. Your health care provider can find your BMI and can help you achieve or maintain a healthy weight.For adults 20 years and older:  A BMI below 18.5 is considered underweight.  A BMI of 18.5 to 24.9 is normal.  A BMI of 25 to 29.9 is considered overweight.  A  BMI of 30 and above is considered obese.  Maintain normal blood lipids and cholesterol levels by exercising and minimizing your intake of saturated fat. Eat a balanced diet with plenty of fruit and vegetables. Blood tests for lipids and cholesterol should begin at age 45 and be repeated every 5 years. If your lipid or cholesterol levels are high, you are over 50, or you are at high risk for heart disease, you may need your cholesterol levels checked more frequently.Ongoing high lipid and cholesterol levels should be treated with medicines if diet and exercise are not working.  If you smoke, find out from your health care provider how to quit. If you do not use tobacco, do not start.  Lung cancer screening is recommended for adults aged 45-80 years who are at high risk for developing lung cancer because of a history of smoking. A yearly low-dose CT scan of the lungs is recommended for people who have at least a 30-pack-year history of smoking and are a current smoker or have quit within the past 15 years. A pack year of smoking is smoking an average of 1 pack of cigarettes a day for 1 year (for example: 1 pack a day for 30 years or 2 packs a day for 15 years). Yearly screening should continue until the smoker has stopped smoking for at least 15 years. Yearly screening should be stopped for people who develop a health problem that would prevent them from having lung cancer treatment.  If you are pregnant, do not drink alcohol. If you are  breastfeeding, be very cautious about drinking alcohol. If you are not pregnant and choose to drink alcohol, do not have more than 1 drink per day. One drink is considered to be 12 ounces (355 mL) of beer, 5 ounces (148 mL) of wine, or 1.5 ounces (44 mL) of liquor.  Avoid use of street drugs. Do not share needles with anyone. Ask for help if you need support or instructions about stopping the use of drugs.  High blood pressure causes heart disease and increases the risk  of stroke. Your blood pressure should be checked at least every 1 to 2 years. Ongoing high blood pressure should be treated with medicines if weight loss and exercise do not work.  If you are 55-79 years old, ask your health care provider if you should take aspirin to prevent strokes.  Diabetes screening is done by taking a blood sample to check your blood glucose level after you have not eaten for a certain period of time (fasting). If you are not overweight and you do not have risk factors for diabetes, you should be screened once every 3 years starting at age 45. If you are overweight or obese and you are 40-70 years of age, you should be screened for diabetes every year as part of your cardiovascular risk assessment.  Breast cancer screening is essential preventive care for women. You should practice "breast self-awareness." This means understanding the normal appearance and feel of your breasts and may include breast self-examination. Any changes detected, no matter how small, should be reported to a health care provider. Women in their 20s and 30s should have a clinical breast exam (CBE) by a health care provider as part of a regular health exam every 1 to 3 years. After age 40, women should have a CBE every year. Starting at age 40, women should consider having a mammogram (breast X-ray test) every year. Women who have a family history of breast cancer should talk to their health care provider about genetic screening. Women at a high risk of breast cancer should talk to their health care providers about having an MRI and a mammogram every year.  Breast cancer gene (BRCA)-related cancer risk assessment is recommended for women who have family members with BRCA-related cancers. BRCA-related cancers include breast, ovarian, tubal, and peritoneal cancers. Having family members with these cancers may be associated with an increased risk for harmful changes (mutations) in the breast cancer genes BRCA1 and  BRCA2. Results of the assessment will determine the need for genetic counseling and BRCA1 and BRCA2 testing.  Your health care provider may recommend that you be screened regularly for cancer of the pelvic organs (ovaries, uterus, and vagina). This screening involves a pelvic examination, including checking for microscopic changes to the surface of your cervix (Pap test). You may be encouraged to have this screening done every 3 years, beginning at age 21.  For women ages 30-65, health care providers may recommend pelvic exams and Pap testing every 3 years, or they may recommend the Pap and pelvic exam, combined with testing for human papilloma virus (HPV), every 5 years. Some types of HPV increase your risk of cervical cancer. Testing for HPV may also be done on women of any age with unclear Pap test results.  Other health care providers may not recommend any screening for nonpregnant women who are considered low risk for pelvic cancer and who do not have symptoms. Ask your health care provider if a screening pelvic exam is right for   you.  If you have had past treatment for cervical cancer or a condition that could lead to cancer, you need Pap tests and screening for cancer for at least 20 years after your treatment. If Pap tests have been discontinued, your risk factors (such as having a new sexual partner) need to be reassessed to determine if screening should resume. Some women have medical problems that increase the chance of getting cervical cancer. In these cases, your health care provider may recommend more frequent screening and Pap tests.  Colorectal cancer can be detected and often prevented. Most routine colorectal cancer screening begins at the age of 50 years and continues through age 75 years. However, your health care provider may recommend screening at an earlier age if you have risk factors for colon cancer. On a yearly basis, your health care provider may provide home test kits to check  for hidden blood in the stool. Use of a small camera at the end of a tube, to directly examine the colon (sigmoidoscopy or colonoscopy), can detect the earliest forms of colorectal cancer. Talk to your health care provider about this at age 50, when routine screening begins. Direct exam of the colon should be repeated every 5-10 years through age 75 years, unless early forms of precancerous polyps or small growths are found.  People who are at an increased risk for hepatitis B should be screened for this virus. You are considered at high risk for hepatitis B if:  You were born in a country where hepatitis B occurs often. Talk with your health care provider about which countries are considered high risk.  Your parents were born in a high-risk country and you have not received a shot to protect against hepatitis B (hepatitis B vaccine).  You have HIV or AIDS.  You use needles to inject street drugs.  You live with, or have sex with, someone who has hepatitis B.  You get hemodialysis treatment.  You take certain medicines for conditions like cancer, organ transplantation, and autoimmune conditions.  Hepatitis C blood testing is recommended for all people born from 1945 through 1965 and any individual with known risks for hepatitis C.  Practice safe sex. Use condoms and avoid high-risk sexual practices to reduce the spread of sexually transmitted infections (STIs). STIs include gonorrhea, chlamydia, syphilis, trichomonas, herpes, HPV, and human immunodeficiency virus (HIV). Herpes, HIV, and HPV are viral illnesses that have no cure. They can result in disability, cancer, and death.  You should be screened for sexually transmitted illnesses (STIs) including gonorrhea and chlamydia if:  You are sexually active and are younger than 24 years.  You are older than 24 years and your health care provider tells you that you are at risk for this type of infection.  Your sexual activity has changed  since you were last screened and you are at an increased risk for chlamydia or gonorrhea. Ask your health care provider if you are at risk.  If you are at risk of being infected with HIV, it is recommended that you take a prescription medicine daily to prevent HIV infection. This is called preexposure prophylaxis (PrEP). You are considered at risk if:  You are sexually active and do not regularly use condoms or know the HIV status of your partner(s).  You take drugs by injection.  You are sexually active with a partner who has HIV.  Talk with your health care provider about whether you are at high risk of being infected with HIV. If   you choose to begin PrEP, you should first be tested for HIV. You should then be tested every 3 months for as long as you are taking PrEP.  Osteoporosis is a disease in which the bones lose minerals and strength with aging. This can result in serious bone fractures or breaks. The risk of osteoporosis can be identified using a bone density scan. Women ages 67 years and over and women at risk for fractures or osteoporosis should discuss screening with their health care providers. Ask your health care provider whether you should take a calcium supplement or vitamin D to reduce the rate of osteoporosis.  Menopause can be associated with physical symptoms and risks. Hormone replacement therapy is available to decrease symptoms and risks. You should talk to your health care provider about whether hormone replacement therapy is right for you.  Use sunscreen. Apply sunscreen liberally and repeatedly throughout the day. You should seek shade when your shadow is shorter than you. Protect yourself by wearing long sleeves, pants, a wide-brimmed hat, and sunglasses year round, whenever you are outdoors.  Once a month, do a whole body skin exam, using a mirror to look at the skin on your back. Tell your health care provider of new moles, moles that have irregular borders, moles that  are larger than a pencil eraser, or moles that have changed in shape or color.  Stay current with required vaccines (immunizations).  Influenza vaccine. All adults should be immunized every year.  Tetanus, diphtheria, and acellular pertussis (Td, Tdap) vaccine. Pregnant women should receive 1 dose of Tdap vaccine during each pregnancy. The dose should be obtained regardless of the length of time since the last dose. Immunization is preferred during the 27th-36th week of gestation. An adult who has not previously received Tdap or who does not know her vaccine status should receive 1 dose of Tdap. This initial dose should be followed by tetanus and diphtheria toxoids (Td) booster doses every 10 years. Adults with an unknown or incomplete history of completing a 3-dose immunization series with Td-containing vaccines should begin or complete a primary immunization series including a Tdap dose. Adults should receive a Td booster every 10 years.  Varicella vaccine. An adult without evidence of immunity to varicella should receive 2 doses or a second dose if she has previously received 1 dose. Pregnant females who do not have evidence of immunity should receive the first dose after pregnancy. This first dose should be obtained before leaving the health care facility. The second dose should be obtained 4-8 weeks after the first dose.  Human papillomavirus (HPV) vaccine. Females aged 13-26 years who have not received the vaccine previously should obtain the 3-dose series. The vaccine is not recommended for use in pregnant females. However, pregnancy testing is not needed before receiving a dose. If a female is found to be pregnant after receiving a dose, no treatment is needed. In that case, the remaining doses should be delayed until after the pregnancy. Immunization is recommended for any person with an immunocompromised condition through the age of 61 years if she did not get any or all doses earlier. During the  3-dose series, the second dose should be obtained 4-8 weeks after the first dose. The third dose should be obtained 24 weeks after the first dose and 16 weeks after the second dose.  Zoster vaccine. One dose is recommended for adults aged 30 years or older unless certain conditions are present.  Measles, mumps, and rubella (MMR) vaccine. Adults born  before 1957 generally are considered immune to measles and mumps. Adults born in 1957 or later should have 1 or more doses of MMR vaccine unless there is a contraindication to the vaccine or there is laboratory evidence of immunity to each of the three diseases. A routine second dose of MMR vaccine should be obtained at least 28 days after the first dose for students attending postsecondary schools, health care workers, or international travelers. People who received inactivated measles vaccine or an unknown type of measles vaccine during 1963-1967 should receive 2 doses of MMR vaccine. People who received inactivated mumps vaccine or an unknown type of mumps vaccine before 1979 and are at high risk for mumps infection should consider immunization with 2 doses of MMR vaccine. For females of childbearing age, rubella immunity should be determined. If there is no evidence of immunity, females who are not pregnant should be vaccinated. If there is no evidence of immunity, females who are pregnant should delay immunization until after pregnancy. Unvaccinated health care workers born before 1957 who lack laboratory evidence of measles, mumps, or rubella immunity or laboratory confirmation of disease should consider measles and mumps immunization with 2 doses of MMR vaccine or rubella immunization with 1 dose of MMR vaccine.  Pneumococcal 13-valent conjugate (PCV13) vaccine. When indicated, a person who is uncertain of his immunization history and has no record of immunization should receive the PCV13 vaccine. All adults 65 years of age and older should receive this  vaccine. An adult aged 19 years or older who has certain medical conditions and has not been previously immunized should receive 1 dose of PCV13 vaccine. This PCV13 should be followed with a dose of pneumococcal polysaccharide (PPSV23) vaccine. Adults who are at high risk for pneumococcal disease should obtain the PPSV23 vaccine at least 8 weeks after the dose of PCV13 vaccine. Adults older than 67 years of age who have normal immune system function should obtain the PPSV23 vaccine dose at least 1 year after the dose of PCV13 vaccine.  Pneumococcal polysaccharide (PPSV23) vaccine. When PCV13 is also indicated, PCV13 should be obtained first. All adults aged 65 years and older should be immunized. An adult younger than age 65 years who has certain medical conditions should be immunized. Any person who resides in a nursing home or long-term care facility should be immunized. An adult smoker should be immunized. People with an immunocompromised condition and certain other conditions should receive both PCV13 and PPSV23 vaccines. People with human immunodeficiency virus (HIV) infection should be immunized as soon as possible after diagnosis. Immunization during chemotherapy or radiation therapy should be avoided. Routine use of PPSV23 vaccine is not recommended for American Indians, [place] Natives, or people younger than 65 years unless there are medical conditions that require PPSV23 vaccine. When indicated, people who have unknown immunization and have no record of immunization should receive PPSV23 vaccine. One-time revaccination 5 years after the first dose of PPSV23 is recommended for people aged 19-64 years who have chronic kidney failure, nephrotic syndrome, asplenia, or immunocompromised conditions. People who received 1-2 doses of PPSV23 before age 65 years should receive another dose of PPSV23 vaccine at age 65 years or later if at least 5 years have passed since the previous dose. Doses of PPSV23 are not  needed for people immunized with PPSV23 at or after age 65 years.  Meningococcal vaccine. Adults with asplenia or persistent complement component deficiencies should receive 2 doses of quadrivalent meningococcal conjugate (MenACWY-D) vaccine. The doses should be obtained   at least 2 months apart. Microbiologists working with certain meningococcal bacteria, [place] recruits, people at risk during an outbreak, and people who travel to or live in countries with a high rate of meningitis should be immunized. A first-year college student up through age 34 years who is living in a residence hall should receive a dose if she did not receive a dose on or after her 16th birthday. Adults who have certain high-risk conditions should receive one or more doses of vaccine.  Hepatitis A vaccine. Adults who wish to be protected from this disease, have certain high-risk conditions, work with hepatitis A-infected animals, work in hepatitis A research labs, or travel to or work in countries with a high rate of hepatitis A should be immunized. Adults who were previously unvaccinated and who anticipate close contact with an international adoptee during the first 60 days after arrival in the [place] from a country with a high rate of hepatitis A should be immunized.  Hepatitis B vaccine. Adults who wish to be protected from this disease, have certain high-risk conditions, may be exposed to blood or other infectious body fluids, are household contacts or sex partners of hepatitis B positive people, are clients or workers in certain care facilities, or travel to or work in countries with a high rate of hepatitis B should be immunized.  Haemophilus influenzae type b (Hib) vaccine. A previously unvaccinated person with asplenia or sickle cell disease or having a scheduled splenectomy should receive 1 dose of Hib vaccine. Regardless of previous immunization, a recipient of a hematopoietic stem cell transplant should receive a  3-dose series 6-12 months after her successful transplant. Hib vaccine is not recommended for adults with HIV infection. Preventive Services / Frequency Ages 35 to 4 years  Blood pressure check.** / Every 3-5 years.  Lipid and cholesterol check.** / Every 5 years beginning at age 60.  Clinical breast exam.** / Every 3 years for women in their 71s and 10s.  BRCA-related cancer risk assessment.** / For women who have family members with a BRCA-related cancer (breast, ovarian, tubal, or peritoneal cancers).  Pap test.** / Every 2 years from ages 76 through 26. Every 3 years starting at age 61 through age 76 or 93 with a history of 3 consecutive normal Pap tests.  HPV screening.** / Every 3 years from ages 37 through ages 60 to 51 with a history of 3 consecutive normal Pap tests.  Hepatitis C blood test.** / For any individual with known risks for hepatitis C.  Skin self-exam. / Monthly.  Influenza vaccine. / Every year.  Tetanus, diphtheria, and acellular pertussis (Tdap, Td) vaccine.** / Consult your health care provider. Pregnant women should receive 1 dose of Tdap vaccine during each pregnancy. 1 dose of Td every 10 years.  Varicella vaccine.** / Consult your health care provider. Pregnant females who do not have evidence of immunity should receive the first dose after pregnancy.  HPV vaccine. / 3 doses over 6 months, if 93 and younger. The vaccine is not recommended for use in pregnant females. However, pregnancy testing is not needed before receiving a dose.  Measles, mumps, rubella (MMR) vaccine.** / You need at least 1 dose of MMR if you were born in 1957 or later. You may also need a 2nd dose. For females of childbearing age, rubella immunity should be determined. If there is no evidence of immunity, females who are not pregnant should be vaccinated. If there is no evidence of immunity, females who are  pregnant should delay immunization until after pregnancy.  Pneumococcal  13-valent conjugate (PCV13) vaccine.** / Consult your health care provider.  Pneumococcal polysaccharide (PPSV23) vaccine.** / 1 to 2 doses if you smoke cigarettes or if you have certain conditions.  Meningococcal vaccine.** / 1 dose if you are age 68 to 8 years and a Market researcher living in a residence hall, or have one of several medical conditions, you need to get vaccinated against meningococcal disease. You may also need additional booster doses.  Hepatitis A vaccine.** / Consult your health care provider.  Hepatitis B vaccine.** / Consult your health care provider.  Haemophilus influenzae type b (Hib) vaccine.** / Consult your health care provider. Ages 7 to 53 years  Blood pressure check.** / Every year.  Lipid and cholesterol check.** / Every 5 years beginning at age 25 years.  Lung cancer screening. / Every year if you are aged 11-80 years and have a 30-pack-year history of smoking and currently smoke or have quit within the past 15 years. Yearly screening is stopped once you have quit smoking for at least 15 years or develop a health problem that would prevent you from having lung cancer treatment.  Clinical breast exam.** / Every year after age 48 years.  BRCA-related cancer risk assessment.** / For women who have family members with a BRCA-related cancer (breast, ovarian, tubal, or peritoneal cancers).  Mammogram.** / Every year beginning at age 41 years and continuing for as long as you are in good health. Consult with your health care provider.  Pap test.** / Every 3 years starting at age 65 years through age 37 or 70 years with a history of 3 consecutive normal Pap tests.  HPV screening.** / Every 3 years from ages 72 years through ages 60 to 40 years with a history of 3 consecutive normal Pap tests.  Fecal occult blood test (FOBT) of stool. / Every year beginning at age 21 years and continuing until age 5 years. You may not need to do this test if you get  a colonoscopy every 10 years.  Flexible sigmoidoscopy or colonoscopy.** / Every 5 years for a flexible sigmoidoscopy or every 10 years for a colonoscopy beginning at age 35 years and continuing until age 48 years.  Hepatitis C blood test.** / For all people born from [birth year] through 1965 and any individual with known risks for hepatitis C.  Skin self-exam. / Monthly.  Influenza vaccine. / Every year.  Tetanus, diphtheria, and acellular pertussis (Tdap/Td) vaccine.** / Consult your health care provider. Pregnant women should receive 1 dose of Tdap vaccine during each pregnancy. 1 dose of Td every 10 years.  Varicella vaccine.** / Consult your health care provider. Pregnant females who do not have evidence of immunity should receive the first dose after pregnancy.  Zoster vaccine.** / 1 dose for adults aged 30 years or older.  Measles, mumps, rubella (MMR) vaccine.** / You need at least 1 dose of MMR if you were born in 1957 or later. You may also need a second dose. For females of childbearing age, rubella immunity should be determined. If there is no evidence of immunity, females who are not pregnant should be vaccinated. If there is no evidence of immunity, females who are pregnant should delay immunization until after pregnancy.  Pneumococcal 13-valent conjugate (PCV13) vaccine.** / Consult your health care provider.  Pneumococcal polysaccharide (PPSV23) vaccine.** / 1 to 2 doses if you smoke cigarettes or if you have certain conditions.  Meningococcal vaccine.** /  Consult your health care provider.  Hepatitis A vaccine.** / Consult your health care provider.  Hepatitis B vaccine.** / Consult your health care provider.  Haemophilus influenzae type b (Hib) vaccine.** / Consult your health care provider. Ages 64 years and over  Blood pressure check.** / Every year.  Lipid and cholesterol check.** / Every 5 years beginning at age 23 years.  Lung cancer screening. / Every year if you  are aged 16-80 years and have a 30-pack-year history of smoking and currently smoke or have quit within the past 15 years. Yearly screening is stopped once you have quit smoking for at least 15 years or develop a health problem that would prevent you from having lung cancer treatment.  Clinical breast exam.** / Every year after age 74 years.  BRCA-related cancer risk assessment.** / For women who have family members with a BRCA-related cancer (breast, ovarian, tubal, or peritoneal cancers).  Mammogram.** / Every year beginning at age 44 years and continuing for as long as you are in good health. Consult with your health care provider.  Pap test.** / Every 3 years starting at age 58 years through age 22 or 39 years with 3 consecutive normal Pap tests. Testing can be stopped between 65 and 70 years with 3 consecutive normal Pap tests and no abnormal Pap or HPV tests in the past 10 years.  HPV screening.** / Every 3 years from ages 64 years through ages 70 or 61 years with a history of 3 consecutive normal Pap tests. Testing can be stopped between 65 and 70 years with 3 consecutive normal Pap tests and no abnormal Pap or HPV tests in the past 10 years.  Fecal occult blood test (FOBT) of stool. / Every year beginning at age 40 years and continuing until age 27 years. You may not need to do this test if you get a colonoscopy every 10 years.  Flexible sigmoidoscopy or colonoscopy.** / Every 5 years for a flexible sigmoidoscopy or every 10 years for a colonoscopy beginning at age 7 years and continuing until age 32 years.  Hepatitis C blood test.** / For all people born from [birth year] through 1967 and any individual with known risks for hepatitis C.  Osteoporosis screening.** / A one-time screening for women ages 30 years and over and women at risk for fractures or osteoporosis.  Skin self-exam. / Monthly.  Influenza vaccine. / Every year.  Tetanus, diphtheria, and acellular pertussis (Tdap/Td)  vaccine.** / 1 dose of Td every 10 years.  Varicella vaccine.** / Consult your health care provider.  Zoster vaccine.** / 1 dose for adults aged 35 years or older.  Pneumococcal 13-valent conjugate (PCV13) vaccine.** / Consult your health care provider.  Pneumococcal polysaccharide (PPSV23) vaccine.** / 1 dose for all adults aged 46 years and older.  Meningococcal vaccine.** / Consult your health care provider.  Hepatitis A vaccine.** / Consult your health care provider.  Hepatitis B vaccine.** / Consult your health care provider.  Haemophilus influenzae type b (Hib) vaccine.** / Consult your health care provider. ** Family history and personal history of risk and conditions may change your health care provider's recommendations.   This information is not intended to replace advice given to you by your health care provider. Make sure you discuss any questions you have with your health care provider.   Document Released: 05/22/2001 Document Revised: 04/16/2014 Document Reviewed: 08/21/2010 Elsevier Interactive Patient Education [employer].

## 2015-10-03 NOTE — Progress Notes (Signed)
Subjective:   Candace Bell is a 65 y.o. female who presents for Medicare Annual (Subsequent) preventive examination.  Review of Systems:   Review of Systems  Constitutional: Negative for activity change, appetite change and fatigue.  HENT: Negative for hearing loss, congestion, tinnitus and ear discharge.   Eyes: Negative for visual disturbance (see optho q1y -- ) Respiratory: Negative for cough, chest tightness and shortness of breath.   Cardiovascular: Negative for chest pain, palpitations and leg swelling.  Gastrointestinal: Negative for abdominal pain, diarrhea, constipation and abdominal distention.  Genitourinary: Negative for urgency, frequency, decreased urine volume and difficulty urinating.  Musculoskeletal: Negative for back pain, arthralgias and gait problem.  Skin: Negative for color change, pallor and rash.  Neurological: Negative for dizziness, light-headedness, numbness and headaches.  Hematological: Negative for adenopathy. Does not bruise/bleed easily.  Psychiatric/Behavioral: Negative for suicidal ideas, confusion, sleep disturbance, self-injury, dysphoric mood, decreased concentration and agitation.  Pt is able to read and write and can do all ADLs No risk for falling No abuse/ violence in home           Objective:     Vitals: BP 140/88 mmHg  Pulse 77  Temp(Src) 98.2 F (36.8 C) (Oral)  Ht 5' 5.5" (1.664 m)  Wt 179 lb 6.4 oz (81.375 kg)  BMI 29.39 kg/m2  SpO2 99%  Body mass index is 29.39 kg/(m^2). BP 140/88 mmHg  Pulse 77  Temp(Src) 98.2 F (36.8 C) (Oral)  Ht 5' 5.5" (1.664 m)  Wt 179 lb 6.4 oz (81.375 kg)  BMI 29.39 kg/m2  SpO2 99% General appearance: alert, cooperative, appears stated age and no distress Head: Normocephalic, without obvious abnormality, atraumatic Eyes: conjunctivae/corneas clear. PERRL, EOM's intact. Fundi benign. Ears: normal TM's and external ear canals both ears Nose: Nares normal. Septum midline. Mucosa normal. No  drainage or sinus tenderness. Throat: lips, mucosa, and tongue normal; teeth and gums normal Neck: no adenopathy, no carotid bruit, no JVD, supple, symmetrical, trachea midline and thyroid not enlarged, symmetric, no tenderness/mass/nodules Back: symmetric, no curvature. ROM normal. No CVA tenderness. Lungs: clear to auscultation bilaterally Breasts: gyn Heart: regular rate and rhythm, S1, S2 normal, no murmur, click, rub or gallop Abdomen: soft, non-tender; bowel sounds normal; no masses,  no organomegaly Pelvic: deferred --gyn Extremities: extremities normal, atraumatic, no cyanosis or edema Pulses: 2+ and symmetric Skin: Skin color, texture, turgor normal. No rashes or lesions Lymph nodes: Cervical, supraclavicular, and axillary nodes normal. Neurologic: Alert and oriented X 3, normal strength and tone. Normal symmetric reflexes. Normal coordination and gait Tobacco History  Smoking status  . Never Smoker   Smokeless tobacco  . Never Used     Counseling given: Not Answered   Past Medical History  Diagnosis Date  . Arthritis   . Hypertension    Past Surgical History  Procedure Laterality Date  . Back surgery    . Abdominal hysterectomy  1993    TAH --took one ovary  . Cataract extraction Bilateral 2015  . Eye surgery Bilateral 06/2013    cataract   Family History  Problem Relation Age of Onset  . Diabetes Mother   . Colon cancer Mother   . Diabetes Brother   . Stroke Brother   . Colon polyps Sister    History  Sexual Activity  . Sexual Activity:  . Partners: Male    Outpatient Encounter Prescriptions as of 10/03/2015  Medication Sig  . meloxicam (MOBIC) 15 MG tablet Take 1 tablet (15 mg total)  by mouth daily.  . valACYclovir (VALTREX) 1000 MG tablet Take 1 tablet (1,000 mg total) by mouth 2 (two) times daily.  . valsartan-hydrochlorothiazide (DIOVAN-HCT) 160-25 MG tablet TAKE 1 TABLET BY MOUTH DAILY, MUST MAKE APPT  . [DISCONTINUED] meloxicam (MOBIC) 15 MG  tablet Take 1 tablet (15 mg total) by mouth daily.  . [DISCONTINUED] valACYclovir (VALTREX) 1000 MG tablet Take 1 tablet (1,000 mg total) by mouth 2 (two) times daily. (Patient taking differently: Take 1,000 mg by mouth as needed. )  . [DISCONTINUED] valsartan-hydrochlorothiazide (DIOVAN-HCT) 160-25 MG tablet TAKE 1 TABLET BY MOUTH DAILY, MUST MAKE APPT  . [DISCONTINUED] HYDROcodone-acetaminophen (NORCO) 7.5-325 MG per tablet Take 1 tablet by mouth every 6 (six) hours as needed for moderate pain or severe pain.  . [DISCONTINUED] methocarbamol (ROBAXIN) 500 MG tablet Take 1 tablet (500 mg total) by mouth 2 (two) times daily.  . [DISCONTINUED] Naproxen Sodium (ALEVE PO) Take by mouth as needed.  . [DISCONTINUED] pneumococcal 13-valent conjugate vaccine (PREVNAR 13) SUSP injection Inject 0.5 mLs into the muscle tomorrow at 10 am.  . [DISCONTINUED] Tdap (BOOSTRIX) 5-2.5-18.5 LF-MCG/0.5 injection Inject 0.5 mLs into the muscle once.  . [DISCONTINUED] zoster vaccine live, PF, (ZOSTAVAX) 16109 UNT/0.65ML injection Inject 19,400 Units into the skin once.   No facility-administered encounter medications on file as of 10/03/2015.    Activities of Daily Living In your present state of health, do you have any difficulty performing the following activities: 10/03/2015  Hearing? N  Vision? Y  Difficulty concentrating or making decisions? N  Walking or climbing stairs? Y  Dressing or bathing? N  Doing errands, shopping? N    Patient Care Team: [name], DO as PCP - General (Family Medicine) Rutherford Guys, MD as Consulting Physician (Ophthalmology) Servando Salina, MD as Consulting Physician (Obstetrics and Gynecology)    Assessment:    cpe Exercise Activities and Dietary recommendations Current Exercise Habits: Home exercise routine, Type of exercise: walking, Time (Minutes): 35, Frequency (Times/Week): 3, Weekly Exercise (Minutes/Week): 105, Intensity: Moderate, Exercise limited by:  None identified  Goals    None     Fall Risk Fall Risk  10/03/2015 08/19/2014 07/31/2013 02/29/2012  Falls in the past year? No No No No   Depression Screen PHQ 2/9 Scores 10/03/2015 08/19/2014 07/31/2013 02/29/2012  PHQ - 2 Score 0 0 0 0     Cognitive Testing mmse 30/30  Immunization History  Administered Date(s) Administered  . Influenza Split 02/29/2012  . Influenza,inj,Quad PF,36+ Mos 01/20/2014, 01/11/2015  . Pneumococcal Polysaccharide-23 07/31/2013   Screening Tests Health Maintenance  Topic Date Due  . Hepatitis C Screening  29-Aug-1950  . TETANUS/TDAP  05/22/1967  . ZOSTAVAX  05/21/2008  . PNA vac Low Risk Adult (2 of 2 - PCV13) 08/01/2014  . INFLUENZA VACCINE  11/08/2015  . MAMMOGRAM  09/20/2017  . COLONOSCOPY  03/24/2019  . DEXA SCAN  Completed      Plan:    see AVS During the course of the visit the patient was educated and counseled about the following appropriate screening and preventive services:   Vaccines to include Pneumoccal, Influenza, Hepatitis B, Td, Zostavax, HCV  Electrocardiogram  Cardiovascular Disease  Colorectal cancer screening  Bone density screening  Diabetes screening  Glaucoma screening  Mammography/PAP  Nutrition counseling   Patient Instructions (the written plan) was given to the patient.  1. History of cold sores   - valACYclovir (VALTREX) 1000 MG tablet; Take 1 tablet (1,000 mg total) by mouth 2 (two) times  daily.  Dispense: 20 tablet; Refill: 2  2. Osteoarthritis of knee, unspecified laterality, unspecified osteoarthritis type stable - meloxicam (MOBIC) 15 MG tablet; Take 1 tablet (15 mg total) by mouth daily.  Dispense: 90 tablet; Refill: 0  3. Essential hypertension stable - valsartan-hydrochlorothiazide (DIOVAN-HCT) 160-25 MG tablet; TAKE 1 TABLET BY MOUTH DAILY, MUST MAKE APPT  Dispense: 90 tablet; Refill: 1 - POCT urinalysis dipstick; Future - Lipid panel; Future - CBC with Differential/Platelet; Future -  Comprehensive metabolic panel; Future  4. Preventative health care See above  5. Routine history and physical examination of adult    [name], DO  10/03/2015

## 2015-10-05 ENCOUNTER — Encounter: Payer: Self-pay | Admitting: Family Medicine

## 2015-10-12 ENCOUNTER — Telehealth: Payer: Self-pay | Admitting: *Deleted

## 2015-10-12 DIAGNOSIS — Z0000 Encounter for general adult medical examination without abnormal findings: Secondary | ICD-10-CM

## 2015-10-12 NOTE — Telephone Encounter (Signed)
Order placed

## 2015-10-12 NOTE — Addendum Note (Signed)
Addended by: Debbrah Alar on: 10/12/2015 11:59 AM   Modules accepted: Orders

## 2015-10-12 NOTE — Telephone Encounter (Signed)
Pt has a lab appt in our office this Friday 10/14/15. [facility] is requesting that we add vitamin d level and fax them the results at [phone]. Paper request forwarded to [facility] in the absence of Dr. Carollee Herter. JG//CMA

## 2015-10-13 ENCOUNTER — Encounter: Payer: Self-pay | Admitting: Family

## 2015-10-13 DIAGNOSIS — M8580 Other specified disorders of bone density and structure, unspecified site: Secondary | ICD-10-CM | POA: Insufficient documentation

## 2015-10-13 HISTORY — DX: Other specified disorders of bone density and structure, unspecified site: M85.80

## 2015-10-14 ENCOUNTER — Other Ambulatory Visit (INDEPENDENT_AMBULATORY_CARE_PROVIDER_SITE_OTHER): Payer: Medicare Other

## 2015-10-14 DIAGNOSIS — Z0000 Encounter for general adult medical examination without abnormal findings: Secondary | ICD-10-CM | POA: Diagnosis not present

## 2015-10-14 DIAGNOSIS — I10 Essential (primary) hypertension: Secondary | ICD-10-CM

## 2015-10-14 DIAGNOSIS — E559 Vitamin D deficiency, unspecified: Secondary | ICD-10-CM | POA: Diagnosis not present

## 2015-10-14 LAB — POC URINALSYSI DIPSTICK (AUTOMATED)
BILIRUBIN UA: NEGATIVE
Blood, UA: NEGATIVE
GLUCOSE UA: NEGATIVE
KETONES UA: NEGATIVE
LEUKOCYTES UA: NEGATIVE
Nitrite, UA: NEGATIVE
PH UA: 6
Protein, UA: NEGATIVE
Spec Grav, UA: >=1.03
Urobilinogen, UA: 4

## 2015-10-14 LAB — CBC WITH DIFFERENTIAL/PLATELET
Basophils Absolute: 0 cells/uL (ref 0–200)
Basophils Relative: 0 %
EOS PCT: 2 %
Eosinophils Absolute: 98 cells/uL (ref 15–500)
HEMATOCRIT: 39.5 % (ref 35.0–45.0)
HEMOGLOBIN: 12.9 g/dL (ref 11.7–15.5)
LYMPHS ABS: 1960 {cells}/uL (ref 850–3900)
Lymphocytes Relative: 40 %
MCH: 28.1 pg (ref 27.0–33.0)
MCHC: 32.7 g/dL (ref 32.0–36.0)
MCV: 86.1 fL (ref 80.0–100.0)
MONO ABS: 441 {cells}/uL (ref 200–950)
MPV: 11.3 fL (ref 7.5–12.5)
Monocytes Relative: 9 %
NEUTROS ABS: 2401 {cells}/uL (ref 1500–7800)
Neutrophils Relative %: 49 %
Platelets: 256 10*3/uL (ref 140–400)
RBC: 4.59 MIL/uL (ref 3.80–5.10)
RDW: 13.7 % (ref 11.0–15.0)
WBC: 4.9 10*3/uL (ref 3.8–10.8)

## 2015-10-14 NOTE — Addendum Note (Signed)
Addended by: Harl Bowie on: 10/14/2015 05:14 PM   Modules accepted: Orders

## 2015-10-14 NOTE — Addendum Note (Signed)
Addended by: Harl Bowie on: 10/14/2015 05:18 PM   Modules accepted: Orders

## 2015-10-15 LAB — COMPREHENSIVE METABOLIC PANEL
ALK PHOS: 56 U/L (ref 33–130)
ALT: 17 U/L (ref 6–29)
AST: 17 U/L (ref 10–35)
Albumin: 4.4 g/dL (ref 3.6–5.1)
BUN: 14 mg/dL (ref 7–25)
CALCIUM: 9.9 mg/dL (ref 8.6–10.4)
CHLORIDE: 102 mmol/L (ref 98–110)
CO2: 24 mmol/L (ref 20–31)
Creat: 0.7 mg/dL (ref 0.50–0.99)
GLUCOSE: 88 mg/dL (ref 65–99)
POTASSIUM: 4.2 mmol/L (ref 3.5–5.3)
Sodium: 141 mmol/L (ref 135–146)
TOTAL PROTEIN: 7.6 g/dL (ref 6.1–8.1)
Total Bilirubin: 1.1 mg/dL (ref 0.2–1.2)

## 2015-10-15 LAB — LIPID PANEL
CHOL/HDL RATIO: 2.6 ratio (ref <=5.0)
Cholesterol: 147 mg/dL (ref 125–200)
HDL: 56 mg/dL (ref >=46)
LDL CALC: 77 mg/dL (ref <130)
TRIGLYCERIDES: 68 mg/dL (ref <150)
VLDL: 14 mg/dL (ref <30)

## 2015-10-15 LAB — VITAMIN D 25 HYDROXY (VIT D DEFICIENCY, FRACTURES): VIT D 25 HYDROXY: 31 ng/mL (ref 30–100)

## 2015-10-20 ENCOUNTER — Telehealth: Payer: Self-pay | Admitting: Family Medicine

## 2015-10-20 ENCOUNTER — Other Ambulatory Visit: Payer: Self-pay

## 2015-10-20 MED ORDER — VITAMIN D (ERGOCALCIFEROL) 1.25 MG (50000 UNIT) PO CAPS: 50000.0000 [IU] | ORAL_CAPSULE | ORAL | Status: DC

## 2015-10-20 NOTE — Telephone Encounter (Signed)
Vitamin D has been faxed.

## 2015-10-20 NOTE — Telephone Encounter (Signed)
Caller name:Woodrow Concannon Relation to [phone] Call back [place]:  Reason for call: pt states that her rx for vitamin d was not at the pharmacy and would like for you to resend it.

## 2015-11-02 ENCOUNTER — Telehealth: Payer: Self-pay

## 2015-11-02 NOTE — Telephone Encounter (Signed)
Received BMD from [facility]. The BMD was ordered and handled by  Dr.Cousins. [id number], The patient stated that Dr.Cousins increased her calcium and vitamin D. No med's at this time, copy sent to be scanned.

## 2015-11-02 NOTE — Telephone Encounter (Signed)
noted 

## 2015-11-18 ENCOUNTER — Other Ambulatory Visit: Payer: Self-pay | Admitting: Family Medicine

## 2015-11-23 DIAGNOSIS — H26492 Other secondary cataract, left eye: Secondary | ICD-10-CM | POA: Diagnosis not present

## 2015-11-30 DIAGNOSIS — H26491 Other secondary cataract, right eye: Secondary | ICD-10-CM | POA: Diagnosis not present

## 2016-01-19 ENCOUNTER — Other Ambulatory Visit: Payer: Self-pay | Admitting: Family Medicine

## 2016-02-07 ENCOUNTER — Other Ambulatory Visit: Payer: Self-pay

## 2016-02-07 DIAGNOSIS — M1710 Unilateral primary osteoarthritis, unspecified knee: Secondary | ICD-10-CM

## 2016-02-07 DIAGNOSIS — M179 Osteoarthritis of knee, unspecified: Secondary | ICD-10-CM

## 2016-02-07 MED ORDER — VITAMIN D (ERGOCALCIFEROL) 1.25 MG (50000 UNIT) PO CAPS: 50000.0000 [IU] | ORAL_CAPSULE | ORAL | 0 refills | Status: DC

## 2016-04-05 ENCOUNTER — Ambulatory Visit: Payer: Medicare Other | Admitting: Family Medicine

## 2016-04-16 ENCOUNTER — Ambulatory Visit (INDEPENDENT_AMBULATORY_CARE_PROVIDER_SITE_OTHER): Payer: Medicare Other | Admitting: Family Medicine

## 2016-04-16 ENCOUNTER — Encounter: Payer: Self-pay | Admitting: Family Medicine

## 2016-04-16 VITALS — BP 135/91 | HR 71 | Temp 98.1°F | Resp 16 | Ht 65.2 in | Wt 177.8 lb

## 2016-04-16 DIAGNOSIS — Z23 Encounter for immunization: Secondary | ICD-10-CM

## 2016-04-16 DIAGNOSIS — I10 Essential (primary) hypertension: Secondary | ICD-10-CM

## 2016-04-16 DIAGNOSIS — E559 Vitamin D deficiency, unspecified: Secondary | ICD-10-CM | POA: Diagnosis not present

## 2016-04-16 DIAGNOSIS — M179 Osteoarthritis of knee, unspecified: Secondary | ICD-10-CM

## 2016-04-16 DIAGNOSIS — M1710 Unilateral primary osteoarthritis, unspecified knee: Secondary | ICD-10-CM | POA: Diagnosis not present

## 2016-04-16 DIAGNOSIS — E785 Hyperlipidemia, unspecified: Secondary | ICD-10-CM | POA: Diagnosis not present

## 2016-04-16 MED ORDER — MELOXICAM 15 MG PO TABS: 15.0000 mg | ORAL_TABLET | Freq: Every day | ORAL | 1 refills | Status: DC

## 2016-04-16 MED ORDER — VALSARTAN-HYDROCHLOROTHIAZIDE 160-25 MG PO TABS: 1.0000 | ORAL_TABLET | Freq: Every day | ORAL | 1 refills | Status: DC

## 2016-04-16 NOTE — Patient Instructions (Signed)
Hypertension Hypertension, commonly called high blood pressure, is when the force of blood pumping through your arteries is too strong. Your arteries are the blood vessels that carry blood from your heart throughout your body. A blood pressure reading consists of a higher number over a lower number, such as 110/72. The higher number (systolic) is the pressure inside your arteries when your heart pumps. The lower number (diastolic) is the pressure inside your arteries when your heart relaxes. Ideally you want your blood pressure below 120/80. Hypertension forces your heart to work harder to pump blood. Your arteries may become narrow or stiff. Having untreated or uncontrolled hypertension can cause heart attack, stroke, kidney disease, and other problems. What increases the risk? Some risk factors for high blood pressure are controllable. Others are not. Risk factors you cannot control include:  Race. You may be at higher risk if you are African American.  Age. Risk increases with age.  Gender. Men are at higher risk than women before age 45 years. After age 65, women are at higher risk than men. Risk factors you can control include:  Not getting enough exercise or physical activity.  Being overweight.  Getting too much fat, sugar, calories, or salt in your diet.  Drinking too much alcohol. What are the signs or symptoms? Hypertension does not usually cause signs or symptoms. Extremely high blood pressure (hypertensive crisis) may cause headache, anxiety, shortness of breath, and nosebleed. How is this diagnosed? To check if you have hypertension, your health care provider will measure your blood pressure while you are seated, with your arm held at the level of your heart. It should be measured at least twice using the same arm. Certain conditions can cause a difference in blood pressure between your right and left arms. A blood pressure reading that is higher than normal on one occasion does  not mean that you need treatment. If it is not clear whether you have high blood pressure, you may be asked to return on a different day to have your blood pressure checked again. Or, you may be asked to monitor your blood pressure at home for 1 or more weeks. How is this treated? Treating high blood pressure includes making lifestyle changes and possibly taking medicine. Living a healthy lifestyle can help lower high blood pressure. You may need to change some of your habits. Lifestyle changes may include:  Following the DASH diet. This diet is high in fruits, vegetables, and whole grains. It is low in salt, red meat, and added sugars.  Keep your sodium intake below 2,300 mg per day.  Getting at least 30-45 minutes of aerobic exercise at least 4 times per week.  Losing weight if necessary.  Not smoking.  Limiting alcoholic beverages.  Learning ways to reduce stress. Your health care provider may prescribe medicine if lifestyle changes are not enough to get your blood pressure under control, and if one of the following is true:  You are 18-59 years of age and your systolic blood pressure is above 140.  You are 60 years of age or older, and your systolic blood pressure is above 150.  Your diastolic blood pressure is above 90.  You have diabetes, and your systolic blood pressure is over 140 or your diastolic blood pressure is over 90.  You have kidney disease and your blood pressure is above 140/90.  You have heart disease and your blood pressure is above 140/90. Your personal target blood pressure may vary depending on your medical   conditions, your age, and other factors. Follow these instructions at home:  Have your blood pressure rechecked as directed by your health care provider.  Take medicines only as directed by your health care provider. Follow the directions carefully. Blood pressure medicines must be taken as prescribed. The medicine does not work as well when you skip  doses. Skipping doses also puts you at risk for problems.  Do not smoke.  Monitor your blood pressure at home as directed by your health care provider. Contact a health care provider if:  You think you are having a reaction to medicines taken.  You have recurrent headaches or feel dizzy.  You have swelling in your ankles.  You have trouble with your vision. Get help right away if:  You develop a severe headache or confusion.  You have unusual weakness, numbness, or feel faint.  You have severe chest or abdominal pain.  You vomit repeatedly.  You have trouble breathing. This information is not intended to replace advice given to you by your health care provider. Make sure you discuss any questions you have with your health care provider. Document Released: 03/26/2005 Document Revised: 09/01/2015 Document Reviewed: 01/16/2013 Elsevier Interactive Patient Education  2017 [employer]  

## 2016-04-16 NOTE — Progress Notes (Signed)
Pre visit review using our clinic review tool, if applicable. No additional management support is needed unless otherwise documented below in the visit note. 

## 2016-04-16 NOTE — Assessment & Plan Note (Signed)
Stable con't meds 
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
## 2016-04-17 LAB — COMPREHENSIVE METABOLIC PANEL
ALK PHOS: 60 U/L (ref 39–117)
ALT: 13 U/L (ref 0–35)
AST: 16 U/L (ref 0–37)
Albumin: 4.3 g/dL (ref 3.5–5.2)
BUN: 17 mg/dL (ref 6–23)
CO2: 29 mEq/L (ref 19–32)
CREATININE: 0.74 mg/dL (ref 0.40–1.20)
Calcium: 9.8 mg/dL (ref 8.4–10.5)
Chloride: 103 mEq/L (ref 96–112)
GFR: 100.4 mL/min (ref >60.00)
Glucose, Bld: 75 mg/dL (ref 70–99)
POTASSIUM: 3.7 meq/L (ref 3.5–5.1)
SODIUM: 140 meq/L (ref 135–145)
TOTAL PROTEIN: 7.9 g/dL (ref 6.0–8.3)
Total Bilirubin: 0.7 mg/dL (ref 0.2–1.2)

## 2016-04-17 LAB — LIPID PANEL
CHOLESTEROL: 158 mg/dL (ref 0–200)
HDL: 55.1 mg/dL (ref >39.00)
LDL Cholesterol: 84 mg/dL (ref 0–99)
NonHDL: 102.66
Total CHOL/HDL Ratio: 3
Triglycerides: 94 mg/dL (ref 0.0–149.0)
VLDL: 18.8 mg/dL (ref 0.0–40.0)

## 2016-04-17 LAB — VITAMIN D 25 HYDROXY (VIT D DEFICIENCY, FRACTURES): VITD: 55.15 ng/mL (ref 30.00–100.00)

## 2016-05-12 ENCOUNTER — Other Ambulatory Visit: Payer: Self-pay | Admitting: Family Medicine

## 2016-05-15 NOTE — Telephone Encounter (Signed)
Rx sent to pharmacy. LB 

## 2016-10-17 DIAGNOSIS — Z1231 Encounter for screening mammogram for malignant neoplasm of breast: Secondary | ICD-10-CM | POA: Diagnosis not present

## 2016-10-17 DIAGNOSIS — Z01419 Encounter for gynecological examination (general) (routine) without abnormal findings: Secondary | ICD-10-CM | POA: Diagnosis not present

## 2016-12-29 ENCOUNTER — Other Ambulatory Visit: Payer: Self-pay | Admitting: Family Medicine

## 2016-12-29 DIAGNOSIS — M179 Osteoarthritis of knee, unspecified: Secondary | ICD-10-CM

## 2016-12-29 DIAGNOSIS — M1710 Unilateral primary osteoarthritis, unspecified knee: Secondary | ICD-10-CM

## 2016-12-31 NOTE — Telephone Encounter (Signed)
On 1.8.18 at [place] pt was advised to F/U in 6 months/thx dmf

## 2017-01-14 ENCOUNTER — Ambulatory Visit: Payer: Medicare Other | Admitting: Family Medicine

## 2017-01-17 ENCOUNTER — Ambulatory Visit: Payer: Medicare Other | Admitting: Family Medicine

## 2017-01-29 ENCOUNTER — Ambulatory Visit (INDEPENDENT_AMBULATORY_CARE_PROVIDER_SITE_OTHER): Payer: Medicare Other | Admitting: Family Medicine

## 2017-01-29 ENCOUNTER — Other Ambulatory Visit: Payer: Self-pay

## 2017-01-29 ENCOUNTER — Encounter: Payer: Self-pay | Admitting: Family Medicine

## 2017-01-29 VITALS — BP 118/82 | HR 70 | Temp 98.7°F | Ht 66.0 in | Wt 174.0 lb

## 2017-01-29 DIAGNOSIS — Z23 Encounter for immunization: Secondary | ICD-10-CM

## 2017-01-29 DIAGNOSIS — M1710 Unilateral primary osteoarthritis, unspecified knee: Secondary | ICD-10-CM

## 2017-01-29 DIAGNOSIS — I10 Essential (primary) hypertension: Secondary | ICD-10-CM | POA: Diagnosis not present

## 2017-01-29 DIAGNOSIS — M179 Osteoarthritis of knee, unspecified: Secondary | ICD-10-CM

## 2017-01-29 MED ORDER — VALSARTAN-HYDROCHLOROTHIAZIDE 160-25 MG PO TABS: 1.0000 | ORAL_TABLET | Freq: Every day | ORAL | 1 refills | Status: DC

## 2017-01-29 MED ORDER — MELOXICAM 15 MG PO TABS: 15.0000 mg | ORAL_TABLET | Freq: Every day | ORAL | 1 refills | Status: DC
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
## 2017-01-29 NOTE — Patient Instructions (Signed)

## 2017-01-29 NOTE — Assessment & Plan Note (Signed)
Well controlled, no changes to meds. Encouraged heart healthy diet such as the DASH diet and exercise as tolerated.  °

## 2017-02-08 ENCOUNTER — Other Ambulatory Visit: Payer: Self-pay | Admitting: Family Medicine

## 2017-04-11 ENCOUNTER — Telehealth: Payer: Self-pay | Admitting: Family Medicine

## 2017-04-11 NOTE — Telephone Encounter (Signed)
It is only certain generic companies-- she need s to call her pharmacy to see if hers is one that needs to be changed

## 2017-04-11 NOTE — Telephone Encounter (Signed)
Copied from [place]. Topic: Quick Communication - See Telephone Encounter >> Apr 11, 2017  8:43 AM Aurelio Brash B wrote: CRM for notification. See Telephone encounter for:  Pt is concerned there is a recall on bp med she takes   valsartan-hydrochlorothiazide (DIOVAN-HCT) 160-25 MG tablet 04/11/17.

## 2017-04-11 NOTE — Telephone Encounter (Signed)
Patient notified. States she will call and check with her pharmacy.

## 2017-04-16 ENCOUNTER — Encounter: Payer: Self-pay | Admitting: Family Medicine

## 2017-04-16 ENCOUNTER — Ambulatory Visit (INDEPENDENT_AMBULATORY_CARE_PROVIDER_SITE_OTHER): Payer: Medicare Other | Admitting: Family Medicine

## 2017-04-16 VITALS — BP 130/88 | HR 84 | Temp 98.3°F | Resp 16 | Ht 66.0 in | Wt 175.6 lb

## 2017-04-16 DIAGNOSIS — I10 Essential (primary) hypertension: Secondary | ICD-10-CM | POA: Diagnosis not present

## 2017-04-16 DIAGNOSIS — R413 Other amnesia: Secondary | ICD-10-CM | POA: Diagnosis not present

## 2017-04-16 DIAGNOSIS — M8580 Other specified disorders of bone density and structure, unspecified site: Secondary | ICD-10-CM | POA: Diagnosis not present

## 2017-04-16 MED ORDER — VALSARTAN-HYDROCHLOROTHIAZIDE 160-25 MG PO TABS: 1.0000 | ORAL_TABLET | Freq: Every day | ORAL | 1 refills | Status: DC

## 2017-04-16 NOTE — Patient Instructions (Signed)
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
## 2017-04-16 NOTE — Assessment & Plan Note (Signed)
Well controlled, no changes to meds. Encouraged heart healthy diet such as the DASH diet and exercise as tolerated.  °

## 2017-04-16 NOTE — Assessment & Plan Note (Signed)
Check labs Check mRI MMSE 30/30  Consider Neuro

## 2017-04-17 LAB — COMPREHENSIVE METABOLIC PANEL
ALT: 12 U/L (ref 0–35)
AST: 15 U/L (ref 0–37)
Albumin: 4.3 g/dL (ref 3.5–5.2)
Alkaline Phosphatase: 55 U/L (ref 39–117)
BUN: 17 mg/dL (ref 6–23)
CHLORIDE: 104 meq/L (ref 96–112)
CO2: 28 mEq/L (ref 19–32)
Calcium: 10.1 mg/dL (ref 8.4–10.5)
Creatinine, Ser: 0.69 mg/dL (ref 0.40–1.20)
GFR: 108.52 mL/min (ref >60.00)
GLUCOSE: 86 mg/dL (ref 70–99)
POTASSIUM: 3.9 meq/L (ref 3.5–5.1)
SODIUM: 141 meq/L (ref 135–145)
TOTAL PROTEIN: 7.6 g/dL (ref 6.0–8.3)
Total Bilirubin: 0.7 mg/dL (ref 0.2–1.2)

## 2017-04-17 LAB — VITAMIN B12: Vitamin B-12: 585 pg/mL (ref 211–911)

## 2017-04-17 LAB — CBC WITH DIFFERENTIAL/PLATELET
Basophils Absolute: 0 10*3/uL (ref 0.0–0.1)
Basophils Relative: 1.1 % (ref 0.0–3.0)
EOS ABS: 0.1 10*3/uL (ref 0.0–0.7)
EOS PCT: 3.2 % (ref 0.0–5.0)
HCT: 38.6 % (ref 36.0–46.0)
Hemoglobin: 12.6 g/dL (ref 12.0–15.0)
LYMPHS ABS: 1.7 10*3/uL (ref 0.7–4.0)
Lymphocytes Relative: 39.9 % (ref 12.0–46.0)
MCHC: 32.7 g/dL (ref 30.0–36.0)
MCV: 87.1 fl (ref 78.0–100.0)
MONO ABS: 0.5 10*3/uL (ref 0.1–1.0)
Monocytes Relative: 10.3 % (ref 3.0–12.0)
NEUTROS PCT: 45.5 % (ref 43.0–77.0)
Neutro Abs: 2 10*3/uL (ref 1.4–7.7)
Platelets: 184 10*3/uL (ref 150.0–400.0)
RBC: 4.42 Mil/uL (ref 3.87–5.11)
RDW: 13.7 % (ref 11.5–15.5)
WBC: 4.4 10*3/uL (ref 4.0–10.5)

## 2017-04-17 LAB — TSH: TSH: 1.9 u[IU]/mL (ref 0.35–4.50)

## 2017-04-19 ENCOUNTER — Other Ambulatory Visit: Payer: Self-pay

## 2017-04-19 DIAGNOSIS — I10 Essential (primary) hypertension: Secondary | ICD-10-CM

## 2017-04-19 MED ORDER — VALSARTAN-HYDROCHLOROTHIAZIDE 160-25 MG PO TABS
1.0000 | ORAL_TABLET | Freq: Every day | ORAL | 1 refills | Status: DC
Start: 2017-04-19 — End: 2017-11-14

## 2017-04-20 LAB — VITAMIN D 1,25 DIHYDROXY
Vitamin D 1, 25 (OH)2 Total: 39 pg/mL (ref 18–72)
Vitamin D2 1, 25 (OH)2: <8 pg/mL
Vitamin D3 1, 25 (OH)2: 39 pg/mL

## 2017-04-20 LAB — RPR: RPR: NONREACTIVE

## 2017-05-24 ENCOUNTER — Telehealth: Payer: Self-pay | Admitting: *Deleted

## 2017-05-24 NOTE — Telephone Encounter (Signed)
-----   Message from [name], DO sent at 04/26/2017  1:16 PM EST ----- Regarding: RE: mri brain We did not tell her that ---- I'll have Shaasia Odle call her  ----- Message ----- From: Katha Hamming Sent: 04/26/2017  11:37 AM To: [name], DO, Trenda Moots Subject: mri brain                                      On our third attempt to schedule Vaughan Basta for her MRI, she said she was told that she did not need this MRI because her blood work was good.     Thanks, Hoyle Sauer

## 2017-05-24 NOTE — Telephone Encounter (Signed)
Spoke with patient and radiology will get her scheduled.

## 2017-06-01 ENCOUNTER — Other Ambulatory Visit (HOSPITAL_BASED_OUTPATIENT_CLINIC_OR_DEPARTMENT_OTHER): Payer: Medicare Other

## 2017-09-19 ENCOUNTER — Telehealth: Payer: Self-pay

## 2017-09-19 DIAGNOSIS — Z1231 Encounter for screening mammogram for malignant neoplasm of breast: Secondary | ICD-10-CM

## 2017-09-19 DIAGNOSIS — M8580 Other specified disorders of bone density and structure, unspecified site: Secondary | ICD-10-CM

## 2017-09-19 NOTE — Telephone Encounter (Signed)
Copied from [place] [id number]. Topic: General - Other >> Sep 19, 2017  8:51 AM Oneta Rack wrote: Relation to pt: self  Call back number: [phone]   Reason for call:  Patient requesting bone density orders please place with [facility], patient states she's due, please advise >> Sep 19, 2017  8:54 AM Oneta Rack wrote: Relation to pt: self  Call back number: [phone]   Reason for call:  Patient requesting bone density orders please place with [facility], patient states she's due, please advise

## 2017-09-26 NOTE — Telephone Encounter (Signed)
Patient notified and order placed. 

## 2017-09-26 NOTE — Telephone Encounter (Signed)
Pt called in to follow up on orders being placed for mammogram. Pt would like a call when orders has been placed by provider.   [phone]

## 2017-09-26 NOTE — Addendum Note (Signed)
Addended by: Kem Boroughs D on: 09/26/2017 04:41 PM   Modules accepted: Orders

## 2017-10-01 ENCOUNTER — Telehealth: Payer: Self-pay | Admitting: *Deleted

## 2017-10-01 NOTE — Telephone Encounter (Signed)
Received Physician Orders from [facility]; forwarded to provider/SLS 06/25

## 2017-11-14 ENCOUNTER — Other Ambulatory Visit: Payer: Self-pay | Admitting: Family Medicine

## 2017-11-14 DIAGNOSIS — I10 Essential (primary) hypertension: Secondary | ICD-10-CM

## 2017-11-17 DIAGNOSIS — H2513 Age-related nuclear cataract, bilateral: Secondary | ICD-10-CM | POA: Diagnosis not present

## 2017-11-19 DIAGNOSIS — M8589 Other specified disorders of bone density and structure, multiple sites: Secondary | ICD-10-CM | POA: Diagnosis not present

## 2017-11-19 DIAGNOSIS — Z1231 Encounter for screening mammogram for malignant neoplasm of breast: Secondary | ICD-10-CM | POA: Diagnosis not present

## 2017-11-19 LAB — HM MAMMOGRAPHY

## 2017-11-19 LAB — HM DEXA SCAN
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
## 2017-11-25 ENCOUNTER — Encounter: Payer: Self-pay | Admitting: *Deleted

## 2017-11-25 ENCOUNTER — Ambulatory Visit (INDEPENDENT_AMBULATORY_CARE_PROVIDER_SITE_OTHER): Payer: Medicare Other | Admitting: *Deleted

## 2017-11-25 VITALS — BP 144/83 | HR 73 | Ht 66.0 in | Wt 177.8 lb

## 2017-11-25 DIAGNOSIS — Z0000 Encounter for general adult medical examination without abnormal findings: Secondary | ICD-10-CM | POA: Diagnosis not present

## 2017-11-25 NOTE — Patient Instructions (Signed)
Please schedule your next medicare wellness visit with me in 1 yr.  Continue to eat heart healthy diet (full of fruits, vegetables, whole grains, lean protein, water--limit salt, fat, and sugar intake) and increase physical activity as tolerated.  Continue doing brain stimulating activities (puzzles, reading, adult coloring books, staying active) to keep memory sharp.    Candace Bell , Thank you for taking time to come for your Medicare Wellness Visit. I appreciate your ongoing commitment to your health goals. Please review the following plan we discussed and let me know if I can assist you in the future.   These are the goals we discussed: Goals    . Maintain exercising 2x/ week and current weight       This is a list of the screening recommended for you and due dates:  Health Maintenance  Topic Date Due  . Mammogram  09/20/2017  . Flu Shot  01/07/2018*  . Tetanus Vaccine  11/26/2018*  .  Hepatitis C: One time screening is recommended by Center for Disease Control  (CDC) for  adults born from [birth year] through 1967.   11/26/2018*  . Pneumonia vaccines (2 of 2 - PCV13) 11/26/2018*  . Colon Cancer Screening  03/24/2019  . DEXA scan (bone density measurement)  Completed  *Topic was postponed. The date shown is not the original due date.    Health Maintenance for Postmenopausal Women Menopause is a normal process in which your reproductive ability comes to an end. This process happens gradually over a span of months to years, usually between the ages of 89 and 45. Menopause is complete when you have missed 12 consecutive menstrual periods. It is important to talk with your health care provider about some of the most common conditions that affect postmenopausal women, such as heart disease, cancer, and bone loss (osteoporosis). Adopting a healthy lifestyle and getting preventive care can help to promote your health and wellness. Those actions can also lower your chances of developing some of  these common conditions. What should I know about menopause? During menopause, you may experience a number of symptoms, such as:  Moderate-to-severe hot flashes.  Night sweats.  Decrease in sex drive.  Mood swings.  Headaches.  Tiredness.  Irritability.  Memory problems.  Insomnia.  Choosing to treat or not to treat menopausal changes is an individual decision that you make with your health care provider. What should I know about hormone replacement therapy and supplements? Hormone therapy products are effective for treating symptoms that are associated with menopause, such as hot flashes and night sweats. Hormone replacement carries certain risks, especially as you become older. If you are thinking about using estrogen or estrogen with progestin treatments, discuss the benefits and risks with your health care provider. What should I know about heart disease and stroke? Heart disease, heart attack, and stroke become more likely as you age. This may be due, in part, to the hormonal changes that your body experiences during menopause. These can affect how your body processes dietary fats, triglycerides, and cholesterol. Heart attack and stroke are both medical emergencies. There are many things that you can do to help prevent heart disease and stroke:  Have your blood pressure checked at least every 1-2 years. High blood pressure causes heart disease and increases the risk of stroke.  If you are 17-59 years old, ask your health care provider if you should take aspirin to prevent a heart attack or a stroke.  Do not use any tobacco products, including  cigarettes, chewing tobacco, or electronic cigarettes. If you need help quitting, ask your health care provider.  It is important to eat a healthy diet and maintain a healthy weight. ? Be sure to include plenty of vegetables, fruits, low-fat dairy products, and lean protein. ? Avoid eating foods that are high in solid fats, added  sugars, or salt (sodium).  Get regular exercise. This is one of the most important things that you can do for your health. ? Try to exercise for at least 150 minutes each week. The type of exercise that you do should increase your heart rate and make you sweat. This is known as moderate-intensity exercise. ? Try to do strengthening exercises at least twice each week. Do these in addition to the moderate-intensity exercise.  Know your numbers.Ask your health care provider to check your cholesterol and your blood glucose. Continue to have your blood tested as directed by your health care provider.  What should I know about cancer screening? There are several types of cancer. Take the following steps to reduce your risk and to catch any cancer development as early as possible. Breast Cancer  Practice breast self-awareness. ? This means understanding how your breasts normally appear and feel. ? It also means doing regular breast self-exams. Let your health care provider know about any changes, no matter how small.  If you are 49 or older, have a clinician do a breast exam (clinical breast exam or CBE) every year. Depending on your age, family history, and medical history, it may be recommended that you also have a yearly breast X-ray (mammogram).  If you have a family history of breast cancer, talk with your health care provider about genetic screening.  If you are at high risk for breast cancer, talk with your health care provider about having an MRI and a mammogram every year.  Breast cancer (BRCA) gene test is recommended for women who have family members with BRCA-related cancers. Results of the assessment will determine the need for genetic counseling and BRCA1 and for BRCA2 testing. BRCA-related cancers include these types: ? Breast. This occurs in males or females. ? Ovarian. ? Tubal. This may also be called fallopian tube cancer. ? Cancer of the abdominal or pelvic lining (peritoneal  cancer). ? Prostate. ? Pancreatic.  Cervical, Uterine, and Ovarian Cancer Your health care provider may recommend that you be screened regularly for cancer of the pelvic organs. These include your ovaries, uterus, and vagina. This screening involves a pelvic exam, which includes checking for microscopic changes to the surface of your cervix (Pap test).  For women ages 21-65, health care providers may recommend a pelvic exam and a Pap test every three years. For women ages 62-65, they may recommend the Pap test and pelvic exam, combined with testing for human papilloma virus (HPV), every five years. Some types of HPV increase your risk of cervical cancer. Testing for HPV may also be done on women of any age who have unclear Pap test results.  Other health care providers may not recommend any screening for nonpregnant women who are considered low risk for pelvic cancer and have no symptoms. Ask your health care provider if a screening pelvic exam is right for you.  If you have had past treatment for cervical cancer or a condition that could lead to cancer, you need Pap tests and screening for cancer for at least 20 years after your treatment. If Pap tests have been discontinued for you, your risk factors (such as  having a new sexual partner) need to be reassessed to determine if you should start having screenings again. Some women have medical problems that increase the chance of getting cervical cancer. In these cases, your health care provider may recommend that you have screening and Pap tests more often.  If you have a family history of uterine cancer or ovarian cancer, talk with your health care provider about genetic screening.  If you have vaginal bleeding after reaching menopause, tell your health care provider.  There are currently no reliable tests available to screen for ovarian cancer.  Lung Cancer Lung cancer screening is recommended for adults 65-83 years old who are at high risk for  lung cancer because of a history of smoking. A yearly low-dose CT scan of the lungs is recommended if you:  Currently smoke.  Have a history of at least 30 pack-years of smoking and you currently smoke or have quit within the past 15 years. A pack-year is smoking an average of one pack of cigarettes per day for one year.  Yearly screening should:  Continue until it has been 15 years since you quit.  Stop if you develop a health problem that would prevent you from having lung cancer treatment.  Colorectal Cancer  This type of cancer can be detected and can often be prevented.  Routine colorectal cancer screening usually begins at age 90 and continues through age 71.  If you have risk factors for colon cancer, your health care provider may recommend that you be screened at an earlier age.  If you have a family history of colorectal cancer, talk with your health care provider about genetic screening.  Your health care provider may also recommend using home test kits to check for hidden blood in your stool.  A small camera at the end of a tube can be used to examine your colon directly (sigmoidoscopy or colonoscopy). This is done to check for the earliest forms of colorectal cancer.  Direct examination of the colon should be repeated every 5-10 years until age 86. However, if early forms of precancerous polyps or small growths are found or if you have a family history or genetic risk for colorectal cancer, you may need to be screened more often.  Skin Cancer  Check your skin from head to toe regularly.  Monitor any moles. Be sure to tell your health care provider: ? About any new moles or changes in moles, especially if there is a change in a mole's shape or color. ? If you have a mole that is larger than the size of a pencil eraser.  If any of your family members has a history of skin cancer, especially at a young age, talk with your health care provider about genetic  screening.  Always use sunscreen. Apply sunscreen liberally and repeatedly throughout the day.  Whenever you are outside, protect yourself by wearing long sleeves, pants, a wide-brimmed hat, and sunglasses.  What should I know about osteoporosis? Osteoporosis is a condition in which bone destruction happens more quickly than new bone creation. After menopause, you may be at an increased risk for osteoporosis. To help prevent osteoporosis or the bone fractures that can happen because of osteoporosis, the following is recommended:  If you are 18-8 years old, get at least 1,000 mg of calcium and at least 600 mg of vitamin D per day.  If you are older than age 61 but younger than age 49, get at least 1,200 mg of calcium and  at least 600 mg of vitamin D per day.  If you are older than age 27, get at least 1,200 mg of calcium and at least 800 mg of vitamin D per day.  Smoking and excessive alcohol intake increase the risk of osteoporosis. Eat foods that are rich in calcium and vitamin D, and do weight-bearing exercises several times each week as directed by your health care provider. What should I know about how menopause affects my mental health? Depression may occur at any age, but it is more common as you become older. Common symptoms of depression include:  Low or sad mood.  Changes in sleep patterns.  Changes in appetite or eating patterns.  Feeling an overall lack of motivation or enjoyment of activities that you previously enjoyed.  Frequent crying spells.  Talk with your health care provider if you think that you are experiencing depression. What should I know about immunizations? It is important that you get and maintain your immunizations. These include:  Tetanus, diphtheria, and pertussis (Tdap) booster vaccine.  Influenza every year before the flu season begins.  Pneumonia vaccine.  Shingles vaccine.  Your health care provider may also recommend other  immunizations. This information is not intended to replace advice given to you by your health care provider. Make sure you discuss any questions you have with your health care provider. Document Released: 05/18/2005 Document Revised: 10/14/2015 Document Reviewed: 12/28/2014 Elsevier Interactive Patient Education  2018 [employer].

## 2017-11-26 ENCOUNTER — Encounter: Payer: Self-pay | Admitting: Family Medicine

## 2017-11-26 ENCOUNTER — Telehealth: Payer: Self-pay | Admitting: *Deleted

## 2017-11-26 ENCOUNTER — Encounter: Payer: Self-pay | Admitting: *Deleted

## 2017-11-26 NOTE — Progress Notes (Signed)
Reviewed  Yvonne R Lowne Chase, DO  

## 2017-11-26 NOTE — Telephone Encounter (Signed)
Pt is requesting a call back she states she has some questions about what was discussed

## 2017-11-26 NOTE — Telephone Encounter (Signed)
Patient given bone density results.  She will think about the fosamax and call us back if she would like to try.  She has been taking calcium and vit d

## 2017-11-26 NOTE — Telephone Encounter (Signed)
Spoke with pt. She is requesting result and to know if recent DEXA has worsened since 2017. 2019 result has not been scanned to chart yet. Pt is asking for return call once report is scanned so she will know what has worsened or changed since last DEXA.

## 2017-11-29 NOTE — Telephone Encounter (Signed)
Sheketia -- pt thought you had a copy of the report already? Wasn't sure if you had sent it to be scanned. Can you verify?

## 2017-12-13 NOTE — Telephone Encounter (Signed)
Patient notified of results.  They only changed slightly.

## 2017-12-30 ENCOUNTER — Ambulatory Visit: Payer: Medicare Other | Admitting: Family Medicine

## 2018-01-24 ENCOUNTER — Encounter: Payer: Self-pay | Admitting: Family Medicine

## 2018-01-24 ENCOUNTER — Ambulatory Visit (INDEPENDENT_AMBULATORY_CARE_PROVIDER_SITE_OTHER): Payer: Medicare Other | Admitting: Family Medicine

## 2018-01-24 VITALS — BP 120/80 | HR 84 | Temp 98.2°F | Resp 16 | Ht 66.0 in | Wt 176.6 lb

## 2018-01-24 DIAGNOSIS — M179 Osteoarthritis of knee, unspecified: Secondary | ICD-10-CM

## 2018-01-24 DIAGNOSIS — Z23 Encounter for immunization: Secondary | ICD-10-CM | POA: Diagnosis not present

## 2018-01-24 DIAGNOSIS — Z8619 Personal history of other infectious and parasitic diseases: Secondary | ICD-10-CM | POA: Diagnosis not present

## 2018-01-24 DIAGNOSIS — M1710 Unilateral primary osteoarthritis, unspecified knee: Secondary | ICD-10-CM

## 2018-01-24 DIAGNOSIS — E785 Hyperlipidemia, unspecified: Secondary | ICD-10-CM | POA: Diagnosis not present

## 2018-01-24 DIAGNOSIS — I10 Essential (primary) hypertension: Secondary | ICD-10-CM | POA: Diagnosis not present

## 2018-01-24 LAB — COMPREHENSIVE METABOLIC PANEL
AG RATIO: 1.3 (calc) (ref 1.0–2.5)
ALKALINE PHOSPHATASE (APISO): 58 U/L (ref 33–130)
ALT: 14 U/L (ref 6–29)
AST: 16 U/L (ref 10–35)
Albumin: 3.9 g/dL (ref 3.6–5.1)
BILIRUBIN TOTAL: 0.8 mg/dL (ref 0.2–1.2)
BUN: 16 mg/dL (ref 7–25)
CALCIUM: 9.4 mg/dL (ref 8.6–10.4)
CO2: 26 mmol/L (ref 20–32)
Chloride: 105 mmol/L (ref 98–110)
Creat: 0.83 mg/dL (ref 0.50–0.99)
Globulin: 3.1 g/dL (calc) (ref 1.9–3.7)
Glucose, Bld: 138 mg/dL — ABNORMAL HIGH (ref 65–99)
Potassium: 3.3 mmol/L — ABNORMAL LOW (ref 3.5–5.3)
SODIUM: 141 mmol/L (ref 135–146)
Total Protein: 7 g/dL (ref 6.1–8.1)

## 2018-01-24 LAB — LIPID PANEL
Cholesterol: 139 mg/dL (ref <200)
HDL: 47 mg/dL — ABNORMAL LOW (ref >50)
LDL Cholesterol (Calc): 78 mg/dL (calc)
Non-HDL Cholesterol (Calc): 92 mg/dL (calc) (ref <130)
Total CHOL/HDL Ratio: 3 (calc) (ref <5.0)
Triglycerides: 68 mg/dL (ref <150)

## 2018-01-24 MED ORDER — VALSARTAN-HYDROCHLOROTHIAZIDE 160-25 MG PO TABS: 1.0000 | ORAL_TABLET | Freq: Every day | ORAL | 1 refills | Status: DC

## 2018-01-24 MED ORDER — VALACYCLOVIR HCL 1 G PO TABS: 1000.0000 mg | ORAL_TABLET | Freq: Two times a day (BID) | ORAL | 2 refills | Status: DC

## 2018-01-24 MED ORDER — MELOXICAM 15 MG PO TABS: 15.0000 mg | ORAL_TABLET | Freq: Every day | ORAL | 1 refills | Status: DC

## 2018-01-24 NOTE — Patient Instructions (Signed)
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
## 2018-01-26 DIAGNOSIS — E785 Hyperlipidemia, unspecified: Secondary | ICD-10-CM | POA: Insufficient documentation

## 2018-01-26 NOTE — Assessment & Plan Note (Signed)
Encouraged heart healthy diet, increase exercise, avoid trans fats, consider a krill oil cap daily 

## 2018-01-26 NOTE — Assessment & Plan Note (Signed)
Well controlled, no changes to meds. Encouraged heart healthy diet such as the DASH diet and exercise as tolerated.  °

## 2018-01-27 ENCOUNTER — Telehealth: Payer: Self-pay

## 2018-01-27 DIAGNOSIS — I10 Essential (primary) hypertension: Secondary | ICD-10-CM

## 2018-01-27 DIAGNOSIS — E785 Hyperlipidemia, unspecified: Secondary | ICD-10-CM

## 2018-01-27 DIAGNOSIS — R739 Hyperglycemia, unspecified: Secondary | ICD-10-CM

## 2018-01-27 NOTE — Telephone Encounter (Signed)
Copied from [place] [phone]. Topic: General - Other >> Jan 27, 2018  9:54 AM Candace Bell A wrote: Reason for CRM: Pt called in wanting someone to call her about the results of her tests from 01/24/18.    Please advise

## 2018-01-28 NOTE — Addendum Note (Signed)
Addended by: Kem Boroughs D on: 01/28/2018 08:49 AM   Modules accepted: Orders

## 2018-01-28 NOTE — Telephone Encounter (Signed)
Patient notified of results see labs.

## 2018-04-30 ENCOUNTER — Other Ambulatory Visit (INDEPENDENT_AMBULATORY_CARE_PROVIDER_SITE_OTHER): Payer: Medicare Other

## 2018-04-30 DIAGNOSIS — R739 Hyperglycemia, unspecified: Secondary | ICD-10-CM | POA: Diagnosis not present

## 2018-04-30 DIAGNOSIS — E785 Hyperlipidemia, unspecified: Secondary | ICD-10-CM

## 2018-04-30 DIAGNOSIS — I10 Essential (primary) hypertension: Secondary | ICD-10-CM | POA: Diagnosis not present

## 2018-05-01 LAB — COMPREHENSIVE METABOLIC PANEL
ALBUMIN: 4.2 g/dL (ref 3.5–5.2)
ALT: 15 U/L (ref 0–35)
AST: 18 U/L (ref 0–37)
Alkaline Phosphatase: 62 U/L (ref 39–117)
BUN: 18 mg/dL (ref 6–23)
CALCIUM: 10.1 mg/dL (ref 8.4–10.5)
CHLORIDE: 103 meq/L (ref 96–112)
CO2: 27 mEq/L (ref 19–32)
Creatinine, Ser: 0.8 mg/dL (ref 0.40–1.20)
GFR: 85.82 mL/min (ref >60.00)
Glucose, Bld: 94 mg/dL (ref 70–99)
POTASSIUM: 3.9 meq/L (ref 3.5–5.1)
Sodium: 140 mEq/L (ref 135–145)
Total Bilirubin: 0.7 mg/dL (ref 0.2–1.2)
Total Protein: 7.3 g/dL (ref 6.0–8.3)

## 2018-05-01 LAB — LIPID PANEL
CHOLESTEROL: 156 mg/dL (ref 0–200)
HDL: 52.1 mg/dL (ref >39.00)
LDL CALC: 94 mg/dL (ref 0–99)
NonHDL: 103.91
TRIGLYCERIDES: 52 mg/dL (ref 0.0–149.0)
Total CHOL/HDL Ratio: 3
VLDL: 10.4 mg/dL (ref 0.0–40.0)

## 2018-05-01 LAB — HEMOGLOBIN A1C: HEMOGLOBIN A1C: 6 % (ref 4.6–6.5)

## 2018-05-07 ENCOUNTER — Telehealth: Payer: Self-pay | Admitting: *Deleted

## 2018-05-07 NOTE — Telephone Encounter (Signed)
Copied from [place] [id number]. Topic: General - Other >> May 05, 2018  8:53 AM Alanda Slim E wrote: Reason for CRM: Pt would like a call to discuss lab results. Pt doesn't use Mychart/ please advise

## 2018-05-08 NOTE — Telephone Encounter (Signed)
Patient notified of labs yesterday

## 2018-06-18 ENCOUNTER — Ambulatory Visit: Payer: Self-pay | Admitting: *Deleted

## 2018-06-18 NOTE — Telephone Encounter (Signed)
Pt reports left thigh pain, inner aspect near groin, onset "Last week." Denies redness, swelling, warmth. States pain is "Worse in the mornings." States no pain at rest, worsens with ambulation. Unable to verbalize how large of area, if localized. "Not my entire thigh." States radiates to left hip and lower back "A little." Reports pain is 5/10, taking Aleve, ineffective. Does not recall any injury. Appt made with Dr. Carollee Herter for tomorrow AM. Care advise given per protocol.  Reason for Disposition . [1] MODERATE pain (e.g., interferes with normal activities, limping) AND [2] present > 3 days  Answer Assessment - Initial Assessment Questions 1. ONSET: "When did the pain start?"     Last week 2. LOCATION: "Where is the pain located?"      Left thigh, inner aspect, near groin 3. PAIN: "How bad is the pain?"    (Scale 1-10; or mild, moderate, severe)   -  MILD (1-3): doesn't interfere with normal activities    -  MODERATE (4-7): interferes with normal activities (e.g., work or school) or awakens from sleep, limping    -  SEVERE (8-10): excruciating pain, unable to do any normal activities, unable to walk     5/10  None at rest, worse in AMs, worsens with ambulation 4. WORK OR EXERCISE: "Has there been any recent work or exercise that involved this part of the body?"     no 5. CAUSE: "What do you think is causing the leg pain?"     Unsure 6. OTHER SYMPTOMS: "Do you have any other symptoms?" (e.g., chest pain, back pain, breathing difficulty, swelling, rash, fever, numbness, weakness)    Mild back pain, lumbar, groin pain radiates to left hip "A little."  Protocols used: LEG PAIN-A-AH

## 2018-06-19 ENCOUNTER — Other Ambulatory Visit: Payer: Self-pay

## 2018-06-19 ENCOUNTER — Ambulatory Visit (INDEPENDENT_AMBULATORY_CARE_PROVIDER_SITE_OTHER): Payer: Medicare Other | Admitting: Family Medicine

## 2018-06-19 ENCOUNTER — Ambulatory Visit (HOSPITAL_BASED_OUTPATIENT_CLINIC_OR_DEPARTMENT_OTHER)
Admission: RE | Admit: 2018-06-19 | Discharge: 2018-06-19 | Disposition: A | Discharge status: Home / Self Care | Payer: Medicare Other | Source: Ambulatory Visit | Attending: Family Medicine | Admitting: Family Medicine

## 2018-06-19 ENCOUNTER — Encounter: Payer: Self-pay | Admitting: Family Medicine

## 2018-06-19 VITALS — BP 110/82 | HR 85 | Temp 97.6°F | Resp 12 | Ht 66.0 in | Wt 171.0 lb

## 2018-06-19 DIAGNOSIS — M160 Bilateral primary osteoarthritis of hip: Secondary | ICD-10-CM | POA: Diagnosis not present

## 2018-06-19 DIAGNOSIS — M25552 Pain in left hip: Secondary | ICD-10-CM | POA: Insufficient documentation

## 2018-06-19 MED ORDER — TRAMADOL HCL 50 MG PO TABS: 50.0000 mg | ORAL_TABLET | Freq: Four times a day (QID) | ORAL | 0 refills | Status: DC | PRN
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
## 2018-06-19 NOTE — Patient Instructions (Signed)

## 2018-07-04 ENCOUNTER — Telehealth: Payer: Self-pay

## 2018-07-04 DIAGNOSIS — M1990 Unspecified osteoarthritis, unspecified site: Secondary | ICD-10-CM

## 2018-07-04 NOTE — Telephone Encounter (Signed)
Copied from [place] [id number]. Topic: Referral - Request for Referral >> Jul 03, 2018  5:17 PM Percell Belt A wrote: Has patient seen PCP for this complaint? Yes  *If NO, is insurance requiring patient see PCP for this issue before PCP can refer them? Referral for which specialty: Rheumatology  Preferred provider/office:  Dr Aundra Dubin [facility] [phone] Reason for referral: pt left referral request on General VM- she has Arthritis and would like to see a specialist

## 2018-07-07 NOTE — Telephone Encounter (Signed)
Would you like to see her first or can we just refer?

## 2018-07-07 NOTE — Telephone Encounter (Signed)
Ok to refer.

## 2018-07-07 NOTE — Telephone Encounter (Signed)
Patient notified referral placed.

## 2018-08-04 ENCOUNTER — Encounter: Payer: Medicare Other | Admitting: Family Medicine

## 2018-08-11 DIAGNOSIS — M150 Primary generalized (osteo)arthritis: Secondary | ICD-10-CM | POA: Diagnosis not present

## 2018-08-11 DIAGNOSIS — M25552 Pain in left hip: Secondary | ICD-10-CM | POA: Diagnosis not present

## 2018-10-21 ENCOUNTER — Encounter: Payer: Self-pay | Admitting: Family Medicine

## 2018-10-21 ENCOUNTER — Ambulatory Visit (INDEPENDENT_AMBULATORY_CARE_PROVIDER_SITE_OTHER): Payer: Medicare Other | Admitting: Family Medicine

## 2018-10-21 ENCOUNTER — Other Ambulatory Visit: Payer: Self-pay

## 2018-10-21 DIAGNOSIS — M79642 Pain in left hand: Secondary | ICD-10-CM | POA: Diagnosis not present

## 2018-10-21 DIAGNOSIS — I10 Essential (primary) hypertension: Secondary | ICD-10-CM | POA: Diagnosis not present

## 2018-10-21 MED ORDER — VALACYCLOVIR HCL 1 G PO TABS: 1000.0000 mg | ORAL_TABLET | Freq: Three times a day (TID) | ORAL | 3 refills | Status: DC

## 2018-10-21 MED ORDER — VALSARTAN-HYDROCHLOROTHIAZIDE 160-25 MG PO TABS: 1.0000 | ORAL_TABLET | Freq: Every day | ORAL | 1 refills | Status: DC

## 2018-10-21 NOTE — Progress Notes (Signed)
Subjective:     Candace Bell is a 68 y.o. female and is here for a comprehensive physical exam. The patient reports problems - L hand pain --pt fell on sunday -- pain in mid finger and hand .  Social History   Socioeconomic History  . Marital status: Single    Spouse name: Not on file  . Number of children: Not on file  . Years of education: Not on file  . 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 Financial resource strain: Not on file  . Food insecurity    Worry: Not on file    Inability: Not on file  . Transportation needs    Medical: Not on file    Non-medical: Not on file  Tobacco Use  . Smoking status: Never Smoker  . Smokeless tobacco: Never Used  Substance and Sexual Activity  . Alcohol use: Yes    Alcohol/week: 0.0 standard drinks    Comment: Occasional wine monthly  . Drug use: No  . Sexual activity: Not Currently    Partners: Male  Lifestyle  . Physical activity    Days per week: Not on file    Minutes per session: Not on file  . Stress: Not on file  Relationships  . Social Herbalist on phone: Not on file    Gets together: Not on file    Attends religious service: Not on file    Active member of club or organization: Not on file    Attends meetings of clubs or organizations: Not on file    Relationship status: Not on file  . Intimate partner violence    Fear of current or ex partner: Not on file    Emotionally abused: Not on file    Physically abused: Not on file    Forced sexual activity: Not on file  Other Topics Concern  . Not on file  Social History Narrative   Exercise-- gym 3x a week    Health Maintenance  Topic Date Due  . TETANUS/TDAP  11/26/2018 (Originally 05/22/1969)  . Hepatitis C Screening  11/26/2018 (Originally July 16, 1950)  . PNA vac Low Risk Adult (2 of 2 - PCV13) 11/26/2018 (Originally 08/01/2014)  . INFLUENZA VACCINE  11/08/2018  .  COLONOSCOPY  03/24/2019  . MAMMOGRAM  11/20/2019  . DEXA SCAN  Completed    The following portions of the patient's history were reviewed and updated as appropriate:  She  has a past medical history of Arthritis, Hypertension, and Osteopenia (10/13/2015). She does not have any pertinent problems on file. She  has a past surgical history that includes Back surgery; Abdominal hysterectomy (1993); Cataract extraction (Bilateral, 2015); and Eye surgery (Bilateral, 06/2013). Her family history includes Colon cancer in her mother; Colon polyps in her sister; Dementia in her father; Diabetes in her brother and mother; Stroke in her brother. She  reports that she has never smoked. She has never used smokeless tobacco. She reports current alcohol use. She reports that she does not use drugs. She has a current medication list which includes the following prescription(s): meloxicam, tramadol, valacyclovir, valsartan-hydrochlorothiazide, and valacyclovir. Current Outpatient Medications on File Prior to Visit  Medication Sig Dispense Refill  . meloxicam (MOBIC) 15 MG tablet Take 1 tablet (15 mg total) by mouth daily. 90 tablet 1  . traMADol (ULTRAM) 50 MG tablet Take 1 tablet (50 mg total) by  mouth every 6 (six) hours as needed. 30 tablet 0  . valACYclovir (VALTREX) 1000 MG tablet Take 1 tablet (1,000 mg total) by mouth 2 (two) times daily. 20 tablet 2   No current facility-administered medications on file prior to visit.    She is allergic to amoxicillin; other; and penicillins..  Review of Systems Review of Systems  Constitutional: Negative for activity change, appetite change and fatigue.  HENT: Negative for hearing loss, congestion, tinnitus and ear discharge.  dentist q22m Eyes: Negative for visual disturbance (see optho q1y -- vision corrected to 20/20 with glasses).  Respiratory: Negative for cough, chest tightness and shortness of breath.   Cardiovascular: Negative for chest pain, palpitations and  leg swelling.  Gastrointestinal: Negative for abdominal pain, diarrhea, constipation and abdominal distention.  Genitourinary: Negative for urgency, frequency, decreased urine volume and difficulty urinating.  Musculoskeletal: Negative for back pain,  and gait problem.  L hand pain Skin: Negative for color change, pallor and rash.  Neurological: Negative for dizziness, light-headedness, numbness and headaches.  Hematological: Negative for adenopathy. Does not bruise/bleed easily.  Psychiatric/Behavioral: Negative for suicidal ideas, confusion, sleep disturbance, self-injury, dysphoric mood, decreased concentration and agitation.       Objective:    BP 134/90 (BP Location: Right Arm, Patient Position: Sitting, Cuff Size: Normal)   Pulse 90   Temp 98.4 F (36.9 C) (Oral)   Resp 18   Ht 5\' 6"  (1.676 m)   Wt 172 lb 12.8 oz (78.4 kg)   SpO2 100%   BMI 27.89 kg/m  General appearance: alert, cooperative, appears stated age and no distress Head: Normocephalic, without obvious abnormality, atraumatic Eyes: negative findings: lids and lashes normal and pupils equal, round, reactive to light and accomodation Ears: normal TM's and external ear canals both ears Nose: Nares normal. Septum midline. Mucosa normal. No drainage or sinus tenderness. Throat: lips, mucosa, and tongue normal; teeth and gums normal Neck: no adenopathy, no carotid bruit, no JVD, supple, symmetrical, trachea midline and thyroid not enlarged, symmetric, no tenderness/mass/nodules Back: symmetric, no curvature. ROM normal. No CVA tenderness. Lungs: clear to auscultation bilaterally Breasts: normal appearance, no masses or tenderness Heart: regular rate and rhythm, S1, S2 normal, no murmur, click, rub or gallop Abdomen: soft, non-tender; bowel sounds normal; no masses,  no organomegaly Pelvic: not indicated; status post hysterectomy, negative ROS Extremities: extremities normal, atraumatic, no cyanosis or edema Pulses: 2+  and symmetric Skin: Skin color, texture, turgor normal. No rashes or lesions Lymph nodes: Cervical, supraclavicular, and axillary nodes normal. Neurologic: Alert and oriented X 3, normal strength and tone. Normal symmetric reflexes. Normal coordination and gait    Assessment:    Healthy female exam.      Plan:    ghm utd-- pt refused all vaccines  Check labs See After Visit Summary for Counseling Recommendations   She will schedule a mammogram    1. Essential hypertension Well controlled, no changes to meds. Encouraged heart healthy diet such as the DASH diet and exercise as tolerated.  - valsartan-hydrochlorothiazide (DIOVAN-HCT) 160-25 MG tablet; Take 1 tablet by mouth daily. Needs ov  Dispense: 90 tablet; Refill: 1 - CBC with Differential/Platelet - Lipid panel - Comprehensive metabolic panel  2. Left hand pain Ice/ heat Tylenol

## 2018-10-21 NOTE — Assessment & Plan Note (Signed)
No pain  Pt s/p fall on Sunday Ice/ heat ---  Tylenol prn Call or rto if symptoms worsen or do not improve

## 2018-10-21 NOTE — Patient Instructions (Signed)
Preventive Care 70 Years and Older, Female Preventive care refers to lifestyle choices and visits with your health care provider that can promote health and wellness. This includes:  A yearly physical exam. This is also called an annual well check.  Regular dental and eye exams.  Immunizations.  Screening for certain conditions.  Healthy lifestyle choices, such as diet and exercise. What can I expect for my preventive care visit? Physical exam Your health care provider will check:  Height and weight. These may be used to calculate body mass index (BMI), which is a measurement that tells if you are at a healthy weight.  Heart rate and blood pressure.  Your skin for abnormal spots. Counseling Your health care provider may ask you questions about:  Alcohol, tobacco, and drug use.  Emotional well-being.  Home and relationship well-being.  Sexual activity.  Eating habits.  History of falls.  Memory and ability to understand (cognition).  Work and work Statistician.  Pregnancy and menstrual history. What immunizations do I need?  Influenza (flu) vaccine  This is recommended every year. Tetanus, diphtheria, and pertussis (Tdap) vaccine  You may need a Td booster every 10 years. Varicella (chickenpox) vaccine  You may need this vaccine if you have not already been vaccinated. Zoster (shingles) vaccine  You may need this after age 70. Pneumococcal conjugate (PCV13) vaccine  One dose is recommended after age 70. Pneumococcal polysaccharide (PPSV23) vaccine  One dose is recommended after age 70. Measles, mumps, and rubella (MMR) vaccine  You may need at least one dose of MMR if you were born in 1957 or later. You may also need a second dose. Meningococcal conjugate (MenACWY) vaccine  You may need this if you have certain conditions. Hepatitis A vaccine  You may need this if you have certain conditions or if you travel or work in places where you may be exposed  to hepatitis A. Hepatitis B vaccine  You may need this if you have certain conditions or if you travel or work in places where you may be exposed to hepatitis B. Haemophilus influenzae type b (Hib) vaccine  You may need this if you have certain conditions. You may receive vaccines as individual doses or as more than one vaccine together in one shot (combination vaccines). Talk with your health care provider about the risks and benefits of combination vaccines. What tests do I need? Blood tests  Lipid and cholesterol levels. These may be checked every 5 years, or more frequently depending on your overall health.  Hepatitis C test.  Hepatitis B test. Screening  Lung cancer screening. You may have this screening every year starting at age 70 if you have a 30-pack-year history of smoking and currently smoke or have quit within the past 15 years.  Colorectal cancer screening. All adults should have this screening starting at age 70 and continuing until age 15. Your health care provider may recommend screening at age 70 if you are at increased risk. You will have tests every 1-10 years, depending on your results and the type of screening test.  Diabetes screening. This is done by checking your blood sugar (glucose) after you have not eaten for a while (fasting). You may have this done every 1-3 years.  Mammogram. This may be done every 1-2 years. Talk with your health care provider about how often you should have regular mammograms.  BRCA-related cancer screening. This may be done if you have a family history of breast, ovarian, tubal, or peritoneal cancers.  Other tests  Sexually transmitted disease (STD) testing.  Bone density scan. This is done to screen for osteoporosis. You may have this done starting at age 70. Follow these instructions at home: Eating and drinking  Eat a diet that includes fresh fruits and vegetables, whole grains, lean protein, and low-fat dairy products. Limit  your intake of foods with high amounts of sugar, saturated fats, and salt.  Take vitamin and mineral supplements as recommended by your health care provider.  Do not drink alcohol if your health care provider tells you not to drink.  If you drink alcohol: ? Limit how much you have to 0-1 drink a day. ? Be aware of how much alcohol is in your drink. In the [place] one drink equals one 12 oz bottle of beer (355 mL), one 5 oz glass of wine (148 mL), or one 1 oz glass of hard liquor (44 mL). Lifestyle  Take daily care of your teeth and gums.  Stay active. Exercise for at least 30 minutes on 5 or more days each week.  Do not use any products that contain nicotine or tobacco, such as cigarettes, e-cigarettes, and chewing tobacco. If you need help quitting, ask your health care provider.  If you are sexually active, practice safe sex. Use a condom or other form of protection in order to prevent STIs (sexually transmitted infections).  Talk with your health care provider about taking a low-dose aspirin or statin. What's next?  Go to your health care provider once a year for a well check visit.  Ask your health care provider how often you should have your eyes and teeth checked.  Stay up to date on all vaccines. This information is not intended to replace advice given to you by your health care provider. Make sure you discuss any questions you have with your health care provider. Document Released: 04/22/2015 Document Revised: 03/20/2018 Document Reviewed: 03/20/2018 Elsevier Patient Education  2020 [employer].

## 2018-10-22 LAB — CBC WITH DIFFERENTIAL/PLATELET
Basophils Absolute: 0.1 10*3/uL (ref 0.0–0.1)
Basophils Relative: 1.3 % (ref 0.0–3.0)
Eosinophils Absolute: 0.1 10*3/uL (ref 0.0–0.7)
Eosinophils Relative: 2.2 % (ref 0.0–5.0)
HCT: 36.8 % (ref 36.0–46.0)
Hemoglobin: 12.1 g/dL (ref 12.0–15.0)
Lymphocytes Relative: 34.6 % (ref 12.0–46.0)
Lymphs Abs: 2.1 10*3/uL (ref 0.7–4.0)
MCHC: 32.8 g/dL (ref 30.0–36.0)
MCV: 86.8 fl (ref 78.0–100.0)
Monocytes Absolute: 0.5 10*3/uL (ref 0.1–1.0)
Monocytes Relative: 8.4 % (ref 3.0–12.0)
Neutro Abs: 3.2 10*3/uL (ref 1.4–7.7)
Neutrophils Relative %: 53.5 % (ref 43.0–77.0)
Platelets: 205 10*3/uL (ref 150.0–400.0)
RBC: 4.24 Mil/uL (ref 3.87–5.11)
RDW: 13.9 % (ref 11.5–15.5)
WBC: 5.9 10*3/uL (ref 4.0–10.5)

## 2018-10-22 LAB — LIPID PANEL
Cholesterol: 138 mg/dL (ref 0–200)
HDL: 49.6 mg/dL (ref >39.00)
LDL Cholesterol: 73 mg/dL (ref 0–99)
NonHDL: 88.2
Total CHOL/HDL Ratio: 3
Triglycerides: 77 mg/dL (ref 0.0–149.0)
VLDL: 15.4 mg/dL (ref 0.0–40.0)

## 2018-10-22 LAB — COMPREHENSIVE METABOLIC PANEL
ALT: 13 U/L (ref 0–35)
AST: 16 U/L (ref 0–37)
Albumin: 4.3 g/dL (ref 3.5–5.2)
Alkaline Phosphatase: 60 U/L (ref 39–117)
BUN: 19 mg/dL (ref 6–23)
CO2: 30 mEq/L (ref 19–32)
Calcium: 9.8 mg/dL (ref 8.4–10.5)
Chloride: 103 mEq/L (ref 96–112)
Creatinine, Ser: 0.77 mg/dL (ref 0.40–1.20)
GFR: 89.56 mL/min (ref >60.00)
Glucose, Bld: 114 mg/dL — ABNORMAL HIGH (ref 70–99)
Potassium: 3.6 mEq/L (ref 3.5–5.1)
Sodium: 141 mEq/L (ref 135–145)
Total Bilirubin: 0.8 mg/dL (ref 0.2–1.2)
Total Protein: 7.5 g/dL (ref 6.0–8.3)

## 2018-11-27 ENCOUNTER — Ambulatory Visit: Payer: Medicare Other | Admitting: *Deleted
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
## 2018-11-28 ENCOUNTER — Encounter: Payer: Self-pay | Admitting: *Deleted

## 2018-11-28 ENCOUNTER — Ambulatory Visit (INDEPENDENT_AMBULATORY_CARE_PROVIDER_SITE_OTHER): Payer: Medicare Other | Admitting: *Deleted

## 2018-11-28 ENCOUNTER — Other Ambulatory Visit: Payer: Self-pay

## 2018-11-28 DIAGNOSIS — Z0000 Encounter for general adult medical examination without abnormal findings: Secondary | ICD-10-CM | POA: Diagnosis not present

## 2018-11-28 NOTE — Patient Instructions (Signed)
See you next year!  Keep up the great work!   Candace Bell , Thank you for taking time to come for your Medicare Wellness Visit. I appreciate your ongoing commitment to your health goals. Please review the following plan we discussed and let me know if I can assist you in the future.   These are the goals we discussed: Goals    . Maintain exercising 2x/ week and current weight       This is a list of the screening recommended for you and due dates:  Health Maintenance  Topic Date Due  .  Hepatitis C: One time screening is recommended by Center for Disease Control  (CDC) for  adults born from [birth year] through 1967.   08-29-1950  . Tetanus Vaccine  05/22/1967  . Pneumonia vaccines (2 of 2 - PCV13) 08/01/2014  . Flu Shot  11/08/2018  . Colon Cancer Screening  03/24/2019  . Mammogram  11/20/2019  . DEXA scan (bone density measurement)  Completed    Health Maintenance After Age 61 After age 72, you are at a higher risk for certain long-term diseases and infections as well as injuries from falls. Falls are a major cause of broken bones and head injuries in people who are older than age 68. Getting regular preventive care can help to keep you healthy and well. Preventive care includes getting regular testing and making lifestyle changes as recommended by your health care provider. Talk with your health care provider about:  Which screenings and tests you should have. A screening is a test that checks for a disease when you have no symptoms.  A diet and exercise plan that is right for you. What should I know about screenings and tests to prevent falls? Screening and testing are the best ways to find a health problem early. Early diagnosis and treatment give you the best chance of managing medical conditions that are common after age 69. Certain conditions and lifestyle choices may make you more likely to have a fall. Your health care provider may recommend:  Regular vision checks. Poor vision  and conditions such as cataracts can make you more likely to have a fall. If you wear glasses, make sure to get your prescription updated if your vision changes.  Medicine review. Work with your health care provider to regularly review all of the medicines you are taking, including over-the-counter medicines. Ask your health care provider about any side effects that may make you more likely to have a fall. Tell your health care provider if any medicines that you take make you feel dizzy or sleepy.  Osteoporosis screening. Osteoporosis is a condition that causes the bones to get weaker. This can make the bones weak and cause them to break more easily.  Blood pressure screening. Blood pressure changes and medicines to control blood pressure can make you feel dizzy.  Strength and balance checks. Your health care provider may recommend certain tests to check your strength and balance while standing, walking, or changing positions.  Foot health exam. Foot pain and numbness, as well as not wearing proper footwear, can make you more likely to have a fall.  Depression screening. You may be more likely to have a fall if you have a fear of falling, feel emotionally low, or feel unable to do activities that you used to do.  Alcohol use screening. Using too much alcohol can affect your balance and may make you more likely to have a fall. What actions can I  take to lower my risk of falls? General instructions  Talk with your health care provider about your risks for falling. Tell your health care provider if: ? You fall. Be sure to tell your health care provider about all falls, even ones that seem minor. ? You feel dizzy, sleepy, or off-balance.  Take over-the-counter and prescription medicines only as told by your health care provider. These include any supplements.  Eat a healthy diet and maintain a healthy weight. A healthy diet includes low-fat dairy products, low-fat (lean) meats, and fiber from  whole grains, beans, and lots of fruits and vegetables. Home safety  Remove any tripping hazards, such as rugs, cords, and clutter.  Install safety equipment such as grab bars in bathrooms and safety rails on stairs.  Keep rooms and walkways well-lit. Activity   Follow a regular exercise program to stay fit. This will help you maintain your balance. Ask your health care provider what types of exercise are appropriate for you.  If you need a cane or walker, use it as recommended by your health care provider.  Wear supportive shoes that have nonskid soles. Lifestyle  Do not drink alcohol if your health care provider tells you not to drink.  If you drink alcohol, limit how much you have: ? 0-1 drink a day for women. ? 0-2 drinks a day for men.  Be aware of how much alcohol is in your drink. In the [place] one drink equals one typical bottle of beer (12 oz), one-half glass of wine (5 oz), or one shot of hard liquor (1 oz).  Do not use any products that contain nicotine or tobacco, such as cigarettes and e-cigarettes. If you need help quitting, ask your health care provider. Summary  Having a healthy lifestyle and getting preventive care can help to protect your health and wellness after age 55.  Screening and testing are the best way to find a health problem early and help you avoid having a fall. Early diagnosis and treatment give you the best chance for managing medical conditions that are more common for people who are older than age 15.  Falls are a major cause of broken bones and head injuries in people who are older than age 49. Take precautions to prevent a fall at home.  Work with your health care provider to learn what changes you can make to improve your health and wellness and to prevent falls. This information is not intended to replace advice given to you by your health care provider. Make sure you discuss any questions you have with your health care provider. Document  Released: 02/06/2017 Document Revised: 07/17/2018 Document Reviewed: 02/06/2017 Elsevier Patient Education  2020 [employer].

## 2018-12-01 DIAGNOSIS — H2513 Age-related nuclear cataract, bilateral: Secondary | ICD-10-CM | POA: Diagnosis not present

## 2018-12-04 NOTE — Progress Notes (Signed)
AWV done on 11/28/18 was virtual.  Virtual Visit via Video Note  I connected with patient on 11/28/18 audio enabled telemedicine application and verified that I am speaking with the correct person using two identifiers.   THIS ENCOUNTER IS A VIRTUAL VISIT DUE TO COVID-19 - PATIENT WAS NOT SEEN IN THE OFFICE. PATIENT HAS CONSENTED TO VIRTUAL VISIT / TELEMEDICINE VISIT   Location of patient: home  Location of provider: office  I discussed the limitations of evaluation and management by telemedicine and the availability of in person appointments. The patient expressed understanding and agreed to proceed.

## 2019-01-12 ENCOUNTER — Encounter: Payer: Self-pay | Admitting: Family Medicine

## 2019-01-12 DIAGNOSIS — Z1231 Encounter for screening mammogram for malignant neoplasm of breast: Secondary | ICD-10-CM | POA: Diagnosis not present

## 2019-01-16 ENCOUNTER — Ambulatory Visit: Payer: Medicare Other

## 2019-01-16 ENCOUNTER — Ambulatory Visit (INDEPENDENT_AMBULATORY_CARE_PROVIDER_SITE_OTHER): Payer: Medicare Other

## 2019-01-16 ENCOUNTER — Other Ambulatory Visit: Payer: Self-pay

## 2019-01-16 DIAGNOSIS — Z23 Encounter for immunization: Secondary | ICD-10-CM | POA: Diagnosis not present

## 2019-03-09 ENCOUNTER — Encounter: Payer: Self-pay | Admitting: Gastroenterology

## 2019-04-23 ENCOUNTER — Other Ambulatory Visit: Payer: Self-pay

## 2019-04-24 ENCOUNTER — Ambulatory Visit: Payer: Medicare Other | Admitting: Family Medicine

## 2019-05-17 ENCOUNTER — Other Ambulatory Visit: Payer: Self-pay | Admitting: Family Medicine

## 2019-05-17 DIAGNOSIS — I10 Essential (primary) hypertension: Secondary | ICD-10-CM

## 2019-05-19 NOTE — Telephone Encounter (Signed)
Sent 30 day supply of valsartan / hctz and scheduled pt in person OV at pt's request for 05/22/19 at 3pm.

## 2019-05-25 ENCOUNTER — Other Ambulatory Visit: Payer: Self-pay

## 2019-05-26 ENCOUNTER — Encounter: Payer: Self-pay | Admitting: Family Medicine

## 2019-05-26 ENCOUNTER — Ambulatory Visit (INDEPENDENT_AMBULATORY_CARE_PROVIDER_SITE_OTHER): Payer: Medicare Other | Admitting: Family Medicine

## 2019-05-26 ENCOUNTER — Other Ambulatory Visit: Payer: Self-pay

## 2019-05-26 DIAGNOSIS — I10 Essential (primary) hypertension: Secondary | ICD-10-CM | POA: Diagnosis not present

## 2019-05-26 MED ORDER — VALACYCLOVIR HCL 1 G PO TABS: 1000.0000 mg | ORAL_TABLET | Freq: Three times a day (TID) | ORAL | 3 refills | Status: DC

## 2019-05-26 MED ORDER — VALSARTAN-HYDROCHLOROTHIAZIDE 160-25 MG PO TABS: 1.0000 | ORAL_TABLET | Freq: Every day | ORAL | 1 refills | Status: DC

## 2019-05-26 NOTE — Progress Notes (Signed)
Patient ID: Candace Bell, female    DOB: 04/06/1951  Age: 69 y.o. MRN: [id number]    Subjective:  Subjective  HPI STARLEEN COMELLA presents for f/u htn.  No complaints.  She has had both of her covid vaccines   Review of Systems  Constitutional: Negative for appetite change, diaphoresis, fatigue and unexpected weight change.  Eyes: Negative for pain, redness and visual disturbance.  Respiratory: Negative for cough, chest tightness, shortness of breath and wheezing.   Cardiovascular: Negative for chest pain, palpitations and leg swelling.  Endocrine: Negative for cold intolerance, heat intolerance, polydipsia, polyphagia and polyuria.  Genitourinary: Negative for difficulty urinating, dysuria and frequency.  Neurological: Negative for dizziness, light-headedness, numbness and headaches.    History Past Medical History:  Diagnosis Date  . Arthritis   . Hypertension   . Osteopenia 10/13/2015    She has a past surgical history that includes Back surgery; Abdominal hysterectomy (1993); Cataract extraction (Bilateral, 2015); and Eye surgery (Bilateral, 06/2013).   Her family history includes Colon cancer in her mother; Colon polyps in her sister; Dementia in her father; Diabetes in her brother and mother; Stroke in her brother.She reports that she has never smoked. She has never used smokeless tobacco. She reports current alcohol use. She reports that she does not use drugs.  Current Outpatient Medications on File Prior to Visit  Medication Sig Dispense Refill  . meloxicam (MOBIC) 15 MG tablet Take 1 tablet (15 mg total) by mouth daily. 90 tablet 1  . traMADol (ULTRAM) 50 MG tablet Take 1 tablet (50 mg total) by mouth every 6 (six) hours as needed. 30 tablet 0   No current facility-administered medications on file prior to visit.     Objective:  Objective  Physical Exam Vitals and nursing note reviewed.  Constitutional:      Appearance: She is well-developed.  HENT:     Head:  Normocephalic and atraumatic.  Eyes:     Conjunctiva/sclera: Conjunctivae normal.  Neck:     Thyroid: No thyromegaly.     Vascular: No carotid bruit or JVD.  Cardiovascular:     Rate and Rhythm: Normal rate and regular rhythm.     Heart sounds: Normal heart sounds. No murmur.  Pulmonary:     Effort: Pulmonary effort is normal. No respiratory distress.     Breath sounds: Normal breath sounds. No wheezing or rales.  Chest:     Chest wall: No tenderness.  Musculoskeletal:     Cervical back: Normal range of motion and neck supple.  Neurological:     Mental Status: She is alert and oriented to person, place, and time.    BP 116/80 (BP Location: Right Arm, Patient Position: Sitting, Cuff Size: Normal)   Pulse 85   Temp (!) 97.4 F (36.3 C) (Temporal)   Resp 18   Ht 5\' 6"  (1.676 m)   Wt 169 lb 9.6 oz (76.9 kg)   SpO2 98%   BMI 27.37 kg/m  Wt Readings from Last 3 Encounters:  05/26/19 169 lb 9.6 oz (76.9 kg)  10/21/18 172 lb 12.8 oz (78.4 kg)  06/19/18 171 lb (77.6 kg)     Lab Results  Component Value Date   WBC 5.9 10/21/2018   HGB 12.1 10/21/2018   HCT 36.8 10/21/2018   PLT 205.0 10/21/2018   GLUCOSE 114 (H) 10/21/2018   CHOL 138 10/21/2018   TRIG 77.0 10/21/2018   HDL 49.60 10/21/2018   LDLCALC 73 10/21/2018   ALT 13 10/21/2018  AST 16 10/21/2018   NA 141 10/21/2018   K 3.6 10/21/2018   CL 103 10/21/2018   CREATININE 0.77 10/21/2018   BUN 19 10/21/2018   CO2 30 10/21/2018   TSH 1.90 04/16/2017   HGBA1C 6.0 04/30/2018   MICROALBUR <0.7 08/19/2014    DG HIP UNILAT WITH PELVIS 2-3 VIEWS LEFT  Result Date: 06/19/2018 CLINICAL DATA:  Left hip pain for 1 week, no known injury, initial encounter EXAM: DG HIP (WITH OR WITHOUT PELVIS) 3V LEFT COMPARISON:  None. FINDINGS: Pelvic ring is intact. Degenerative changes of the hip joints are noted bilaterally. No acute fracture or dislocation is seen. No soft tissue abnormality is noted. IMPRESSION: No acute abnormality  noted. Electronically Signed   By: Inez Catalina M.D.   On: 06/19/2018 16:12     Assessment & Plan:  Plan  I am having [name] maintain her meloxicam, traMADol, valACYclovir, and valsartan-hydrochlorothiazide.  Meds ordered this encounter  Medications  . valACYclovir (VALTREX) 1000 MG tablet    Sig: Take 1 tablet (1,000 mg total) by mouth 3 (three) times daily.    Dispense:  21 tablet    Refill:  3  . valsartan-hydrochlorothiazide (DIOVAN-HCT) 160-25 MG tablet    Sig: Take 1 tablet by mouth daily.    Dispense:  90 tablet    Refill:  1    Problem List Items Addressed This Visit      Unprioritized   Essential hypertension    Well controlled, no changes to meds. Encouraged heart healthy diet such as the DASH diet and exercise as tolerated.       Relevant Medications   valsartan-hydrochlorothiazide (DIOVAN-HCT) 160-25 MG tablet   Other Relevant Orders   Comprehensive metabolic panel   Lipid panel   CBC with Differential/Platelet      Follow-up: Return if symptoms worsen or fail to improve, for fasting, annual exam.  [name], DO

## 2019-05-26 NOTE — Assessment & Plan Note (Signed)
Well controlled, no changes to meds. Encouraged heart healthy diet such as the DASH diet and exercise as tolerated.  °

## 2019-05-26 NOTE — Patient Instructions (Signed)
DASH Eating Plan DASH stands for "Dietary Approaches to Stop Hypertension." The DASH eating plan is a healthy eating plan that has been shown to reduce high blood pressure (hypertension). It may also reduce your risk for type 2 diabetes, heart disease, and stroke. The DASH eating plan may also help with weight loss. What are tips for following this plan?  General guidelines  Avoid eating more than 2,300 mg (milligrams) of salt (sodium) a day. If you have hypertension, you may need to reduce your sodium intake to 1,500 mg a day.  Limit alcohol intake to no more than 1 drink a day for nonpregnant women and 2 drinks a day for men. One drink equals 12 oz of beer, 5 oz of wine, or 1 oz of hard liquor.  Work with your health care provider to maintain a healthy body weight or to lose weight. Ask what an ideal weight is for you.  Get at least 30 minutes of exercise that causes your heart to beat faster (aerobic exercise) most days of the week. Activities may include walking, swimming, or biking.  Work with your health care provider or diet and nutrition specialist (dietitian) to adjust your eating plan to your individual calorie needs. Reading food labels   Check food labels for the amount of sodium per serving. Choose foods with less than 5 percent of the Daily Value of sodium. Generally, foods with less than 300 mg of sodium per serving fit into this eating plan.  To find whole grains, look for the word "whole" as the first word in the ingredient list. Shopping  Buy products labeled as "low-sodium" or "no salt added."  Buy fresh foods. Avoid canned foods and premade or frozen meals. Cooking  Avoid adding salt when cooking. Use salt-free seasonings or herbs instead of table salt or sea salt. Check with your health care provider or pharmacist before using salt substitutes.  Do not fry foods. Cook foods using healthy methods such as baking, boiling, grilling, and broiling instead.  Cook with  heart-healthy oils, such as olive, canola, soybean, or sunflower oil. Meal planning  Eat a balanced diet that includes: ? 5 or more servings of fruits and vegetables each day. At each meal, try to fill half of your plate with fruits and vegetables. ? Up to 6-8 servings of whole grains each day. ? Less than 6 oz of lean meat, poultry, or fish each day. A 3-oz serving of meat is about the same size as a deck of cards. One egg equals 1 oz. ? 2 servings of low-fat dairy each day. ? A serving of nuts, seeds, or beans 5 times each week. ? Heart-healthy fats. Healthy fats called Omega-3 fatty acids are found in foods such as flaxseeds and coldwater fish, like sardines, salmon, and mackerel.  Limit how much you eat of the following: ? Canned or prepackaged foods. ? Food that is high in trans fat, such as fried foods. ? Food that is high in saturated fat, such as fatty meat. ? Sweets, desserts, sugary drinks, and other foods with added sugar. ? Full-fat dairy products.  Do not salt foods before eating.  Try to eat at least 2 vegetarian meals each week.  Eat more home-cooked food and less restaurant, buffet, and fast food.  When eating at a restaurant, ask that your food be prepared with less salt or no salt, if possible. What foods are recommended? The items listed may not be a complete list. Talk with your dietitian about   what dietary choices are best for you. Grains Whole-grain or whole-wheat bread. Whole-grain or whole-wheat pasta. Brown rice. Oatmeal. Quinoa. Bulgur. Whole-grain and low-sodium cereals. Pita bread. Low-fat, low-sodium crackers. Whole-wheat flour tortillas. Vegetables Fresh or frozen vegetables (raw, steamed, roasted, or grilled). Low-sodium or reduced-sodium tomato and vegetable juice. Low-sodium or reduced-sodium tomato sauce and tomato paste. Low-sodium or reduced-sodium canned vegetables. Fruits All fresh, dried, or frozen fruit. Canned fruit in natural juice (without  added sugar). Meat and other protein foods Skinless chicken or turkey. Ground chicken or turkey. Pork with fat trimmed off. Fish and seafood. Egg whites. Dried beans, peas, or lentils. Unsalted nuts, nut butters, and seeds. Unsalted canned beans. Lean cuts of beef with fat trimmed off. Low-sodium, lean deli meat. Dairy Low-fat (1%) or fat-free (skim) milk. Fat-free, low-fat, or reduced-fat cheeses. Nonfat, low-sodium ricotta or cottage cheese. Low-fat or nonfat yogurt. Low-fat, low-sodium cheese. Fats and oils Soft margarine without trans fats. Vegetable oil. Low-fat, reduced-fat, or light mayonnaise and salad dressings (reduced-sodium). Canola, safflower, olive, soybean, and sunflower oils. Avocado. Seasoning and other foods Herbs. Spices. Seasoning mixes without salt. Unsalted popcorn and pretzels. Fat-free sweets. What foods are not recommended? The items listed may not be a complete list. Talk with your dietitian about what dietary choices are best for you. Grains Baked goods made with fat, such as croissants, muffins, or some breads. Dry pasta or rice meal packs. Vegetables Creamed or fried vegetables. Vegetables in a cheese sauce. Regular canned vegetables (not low-sodium or reduced-sodium). Regular canned tomato sauce and paste (not low-sodium or reduced-sodium). Regular tomato and vegetable juice (not low-sodium or reduced-sodium). Pickles. Olives. Fruits Canned fruit in a light or heavy syrup. Fried fruit. Fruit in cream or butter sauce. Meat and other protein foods Fatty cuts of meat. Ribs. Fried meat. Bacon. Sausage. Bologna and other processed lunch meats. Salami. Fatback. Hotdogs. Bratwurst. Salted nuts and seeds. Canned beans with added salt. Canned or smoked fish. Whole eggs or egg yolks. Chicken or turkey with skin. Dairy Whole or 2% milk, cream, and half-and-half. Whole or full-fat cream cheese. Whole-fat or sweetened yogurt. Full-fat cheese. Nondairy creamers. Whipped toppings.  Processed cheese and cheese spreads. Fats and oils Butter. Stick margarine. Lard. Shortening. Ghee. Bacon fat. Tropical oils, such as coconut, palm kernel, or palm oil. Seasoning and other foods Salted popcorn and pretzels. Onion salt, garlic salt, seasoned salt, table salt, and sea salt. Worcestershire sauce. Tartar sauce. Barbecue sauce. Teriyaki sauce. Soy sauce, including reduced-sodium. Steak sauce. Canned and packaged gravies. Fish sauce. Oyster sauce. Cocktail sauce. Horseradish that you find on the shelf. Ketchup. Mustard. Meat flavorings and tenderizers. Bouillon cubes. Hot sauce and Tabasco sauce. Premade or packaged marinades. Premade or packaged taco seasonings. Relishes. Regular salad dressings. Where to find more information:  National Heart, Lung, and Blood Institute: www.nhlbi.nih.gov  American Heart Association: www.heart.org Summary  The DASH eating plan is a healthy eating plan that has been shown to reduce high blood pressure (hypertension). It may also reduce your risk for type 2 diabetes, heart disease, and stroke.  With the DASH eating plan, you should limit salt (sodium) intake to 2,300 mg a day. If you have hypertension, you may need to reduce your sodium intake to 1,500 mg a day.  When on the DASH eating plan, aim to eat more fresh fruits and vegetables, whole grains, lean proteins, low-fat dairy, and heart-healthy fats.  Work with your health care provider or diet and nutrition specialist (dietitian) to adjust your eating plan to your   individual calorie needs. This information is not intended to replace advice given to you by your health care provider. Make sure you discuss any questions you have with your health care provider. Document Revised: 03/08/2017 Document Reviewed: 03/19/2016 Elsevier Patient Education  2020 Elsevier Inc.  

## 2019-05-27 LAB — LIPID PANEL
Cholesterol: 144 mg/dL (ref 0–200)
HDL: 48 mg/dL (ref >39.00)
LDL Cholesterol: 76 mg/dL (ref 0–99)
NonHDL: 95.8
Total CHOL/HDL Ratio: 3
Triglycerides: 98 mg/dL (ref 0.0–149.0)
VLDL: 19.6 mg/dL (ref 0.0–40.0)

## 2019-05-27 LAB — CBC WITH DIFFERENTIAL/PLATELET
Basophils Absolute: 0.1 10*3/uL (ref 0.0–0.1)
Basophils Relative: 1.1 % (ref 0.0–3.0)
Eosinophils Absolute: 0.2 10*3/uL (ref 0.0–0.7)
Eosinophils Relative: 4 % (ref 0.0–5.0)
HCT: 36.6 % (ref 36.0–46.0)
Hemoglobin: 12.3 g/dL (ref 12.0–15.0)
Lymphocytes Relative: 39.8 % (ref 12.0–46.0)
Lymphs Abs: 1.9 10*3/uL (ref 0.7–4.0)
MCHC: 33.6 g/dL (ref 30.0–36.0)
MCV: 87 fl (ref 78.0–100.0)
Monocytes Absolute: 0.5 10*3/uL (ref 0.1–1.0)
Monocytes Relative: 11.4 % (ref 3.0–12.0)
Neutro Abs: 2.1 10*3/uL (ref 1.4–7.7)
Neutrophils Relative %: 43.7 % (ref 43.0–77.0)
Platelets: 191 10*3/uL (ref 150.0–400.0)
RBC: 4.21 Mil/uL (ref 3.87–5.11)
RDW: 13.5 % (ref 11.5–15.5)
WBC: 4.8 10*3/uL (ref 4.0–10.5)

## 2019-05-27 LAB — COMPREHENSIVE METABOLIC PANEL
ALT: 13 U/L (ref 0–35)
AST: 16 U/L (ref 0–37)
Albumin: 4.1 g/dL (ref 3.5–5.2)
Alkaline Phosphatase: 57 U/L (ref 39–117)
BUN: 21 mg/dL (ref 6–23)
CO2: 28 mEq/L (ref 19–32)
Calcium: 10.1 mg/dL (ref 8.4–10.5)
Chloride: 102 mEq/L (ref 96–112)
Creatinine, Ser: 0.73 mg/dL (ref 0.40–1.20)
GFR: 95.09 mL/min (ref >60.00)
Glucose, Bld: 82 mg/dL (ref 70–99)
Potassium: 3.6 mEq/L (ref 3.5–5.1)
Sodium: 138 mEq/L (ref 135–145)
Total Bilirubin: 0.7 mg/dL (ref 0.2–1.2)
Total Protein: 7.4 g/dL (ref 6.0–8.3)

## 2019-07-13 ENCOUNTER — Telehealth: Payer: Self-pay | Admitting: Family Medicine

## 2019-07-13 NOTE — Progress Notes (Signed)
  Chronic Care Management   Outreach Note  07/13/2019 Name: Candace Bell MRN: [id number] DOB: November 24, 1950  Referred by: [name], DO Reason for referral : No chief complaint on file.   An unsuccessful telephone outreach was attempted today. The patient was referred to the pharmacist for assistance with care management and care coordination.   Follow Up Plan:   Raynicia Dukes [employer] Scheduler

## 2019-11-26 ENCOUNTER — Other Ambulatory Visit: Payer: Self-pay | Admitting: Family Medicine

## 2019-11-26 ENCOUNTER — Encounter: Payer: Medicare Other | Admitting: Family Medicine

## 2019-11-26 DIAGNOSIS — I10 Essential (primary) hypertension: Secondary | ICD-10-CM
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
## 2019-11-30 ENCOUNTER — Ambulatory Visit (INDEPENDENT_AMBULATORY_CARE_PROVIDER_SITE_OTHER): Payer: Medicare Other | Admitting: *Deleted

## 2019-11-30 ENCOUNTER — Encounter: Payer: Self-pay | Admitting: *Deleted

## 2019-11-30 ENCOUNTER — Other Ambulatory Visit: Payer: Self-pay

## 2019-11-30 DIAGNOSIS — Z0000 Encounter for general adult medical examination without abnormal findings: Secondary | ICD-10-CM | POA: Diagnosis not present

## 2019-11-30 NOTE — Patient Instructions (Signed)
Please schedule your next medicare wellness visit with me in 1 yr.  Continue to eat heart healthy diet (full of fruits, vegetables, whole grains, lean protein, water--limit salt, fat, and sugar intake) and increase physical activity as tolerated.  Continue doing brain stimulating activities (puzzles, reading, adult coloring books, staying active) to keep memory sharp.   Bring a copy of your living will and/or healthcare power of attorney to your next office visit.   Ms. Handler , Thank you for taking time to come for your Medicare Wellness Visit. I appreciate your ongoing commitment to your health goals. Please review the following plan we discussed and let me know if I can assist you in the future.   These are the goals we discussed: Goals    . Maintain exercising 2x/ week and current weight       This is a list of the screening recommended for you and due dates:  Health Maintenance  Topic Date Due  .  Hepatitis C: One time screening is recommended by Center for Disease Control  (CDC) for  adults born from [birth year] through 1967.   Never done  . COVID-19 Vaccine (1) Never done  . Tetanus Vaccine  Never done  . Pneumonia vaccines (2 of 2 - PCV13) 08/01/2014  . Colon Cancer Screening  03/24/2019  . Flu Shot  11/08/2019  . Mammogram  01/11/2021  . DEXA scan (bone density measurement)  Completed    Preventive Care 65 Years and Older, Female Preventive care refers to lifestyle choices and visits with your health care provider that can promote health and wellness. This includes:  A yearly physical exam. This is also called an annual well check.  Regular dental and eye exams.  Immunizations.  Screening for certain conditions.  Healthy lifestyle choices, such as diet and exercise. What can I expect for my preventive care visit? Physical exam Your health care provider will check:  Height and weight. These may be used to calculate body mass index (BMI), which is a measurement that  tells if you are at a healthy weight.  Heart rate and blood pressure.  Your skin for abnormal spots. Counseling Your health care provider may ask you questions about:  Alcohol, tobacco, and drug use.  Emotional well-being.  Home and relationship well-being.  Sexual activity.  Eating habits.  History of falls.  Memory and ability to understand (cognition).  Work and work Statistician.  Pregnancy and menstrual history. What immunizations do I need?  Influenza (flu) vaccine  This is recommended every year. Tetanus, diphtheria, and pertussis (Tdap) vaccine  You may need a Td booster every 71 years. Varicella (chickenpox) vaccine  You may need this vaccine if you have not already been vaccinated. Zoster (shingles) vaccine  You may need this after age 30. Pneumococcal conjugate (PCV13) vaccine  One dose is recommended after age 71. Pneumococcal polysaccharide (PPSV23) vaccine  One dose is recommended after age 65. Measles, mumps, and rubella (MMR) vaccine  You may need at least one dose of MMR if you were born in 1957 or later. You may also need a second dose. Meningococcal conjugate (MenACWY) vaccine  You may need this if you have certain conditions. Hepatitis A vaccine  You may need this if you have certain conditions or if you travel or work in places where you may be exposed to hepatitis A. Hepatitis B vaccine  You may need this if you have certain conditions or if you travel or work in places where you may  be exposed to hepatitis B. Haemophilus influenzae type b (Hib) vaccine  You may need this if you have certain conditions. You may receive vaccines as individual doses or as more than one vaccine together in one shot (combination vaccines). Talk with your health care provider about the risks and benefits of combination vaccines. What tests do I need? Blood tests  Lipid and cholesterol levels. These may be checked every 5 years, or more frequently  depending on your overall health.  Hepatitis C test.  Hepatitis B test. Screening  Lung cancer screening. You may have this screening every year starting at age 71 if you have a 30-pack-year history of smoking and currently smoke or have quit within the past 15 years.  Colorectal cancer screening. All adults should have this screening starting at age 35 and continuing until age 40. Your health care provider may recommend screening at age 59 if you are at increased risk. You will have tests every 1-10 years, depending on your results and the type of screening test.  Diabetes screening. This is done by checking your blood sugar (glucose) after you have not eaten for a while (fasting). You may have this done every 71-3 years.  Mammogram. This may be done every 1-2 years. Talk with your health care provider about how often you should have regular mammograms.  BRCA-related cancer screening. This may be done if you have a family history of breast, ovarian, tubal, or peritoneal cancers. Other tests  Sexually transmitted disease (STD) testing.  Bone density scan. This is done to screen for osteoporosis. You may have this done starting at age 75. Follow these instructions at home: Eating and drinking  Eat a diet that includes fresh fruits and vegetables, whole grains, lean protein, and low-fat dairy products. Limit your intake of foods with high amounts of sugar, saturated fats, and salt.  Take vitamin and mineral supplements as recommended by your health care provider.  Do not drink alcohol if your health care provider tells you not to drink.  If you drink alcohol: ? Limit how much you have to 0-1 drink a day. ? Be aware of how much alcohol is in your drink. In the [place] one drink equals one 12 oz bottle of beer (355 mL), one 5 oz glass of wine (148 mL), or one 1 oz glass of hard liquor (44 mL). Lifestyle  Take daily care of your teeth and gums.  Stay active. Exercise for at least 30  minutes on 5 or more days each week.  Do not use any products that contain nicotine or tobacco, such as cigarettes, e-cigarettes, and chewing tobacco. If you need help quitting, ask your health care provider.  If you are sexually active, practice safe sex. Use a condom or other form of protection in order to prevent STIs (sexually transmitted infections).  Talk with your health care provider about taking a low-dose aspirin or statin. What's next?  Go to your health care provider once a year for a well check visit.  Ask your health care provider how often you should have your eyes and teeth checked.  Stay up to date on all vaccines. This information is not intended to replace advice given to you by your health care provider. Make sure you discuss any questions you have with your health care provider. Document Revised: 03/20/2018 Document Reviewed: 03/20/2018 Elsevier Patient Education  2020 [employer].

## 2019-12-01 ENCOUNTER — Ambulatory Visit: Payer: Medicare Other | Attending: Internal Medicine

## 2019-12-01 DIAGNOSIS — Z23 Encounter for immunization: Secondary | ICD-10-CM

## 2019-12-01 NOTE — Progress Notes (Signed)
   Covid-[facility] Vaccination [facility]  Name:  Candace Bell    MRN: [id number] DOB: 1951-02-18  12/01/2019  Candace Bell was observed post Covid-19 immunization for 15 minutes without incident. She was provided with Vaccine Information Sheet and instruction to access the V-Safe system.   Candace Bell was instructed to call 911 with any severe reactions post vaccine: Marland Kitchen Difficulty breathing  . Swelling of face and throat  . A fast heartbeat  . A bad rash all over body  . Dizziness and weakness

## 2019-12-09 DIAGNOSIS — H524 Presbyopia: Secondary | ICD-10-CM | POA: Diagnosis not present

## 2020-01-15 ENCOUNTER — Ambulatory Visit (INDEPENDENT_AMBULATORY_CARE_PROVIDER_SITE_OTHER): Payer: Medicare Other | Admitting: Family Medicine

## 2020-01-15 ENCOUNTER — Encounter: Payer: Self-pay | Admitting: Family Medicine

## 2020-01-15 ENCOUNTER — Other Ambulatory Visit: Payer: Self-pay

## 2020-01-15 VITALS — BP 132/86 | HR 84 | Temp 99.1°F | Resp 12 | Ht 66.0 in | Wt 169.6 lb

## 2020-01-15 DIAGNOSIS — Z23 Encounter for immunization: Secondary | ICD-10-CM

## 2020-01-15 DIAGNOSIS — Z1159 Encounter for screening for other viral diseases: Secondary | ICD-10-CM

## 2020-01-15 DIAGNOSIS — E2839 Other primary ovarian failure: Secondary | ICD-10-CM

## 2020-01-15 DIAGNOSIS — Z1211 Encounter for screening for malignant neoplasm of colon: Secondary | ICD-10-CM

## 2020-01-15 DIAGNOSIS — Z0000 Encounter for general adult medical examination without abnormal findings: Secondary | ICD-10-CM | POA: Diagnosis not present

## 2020-01-15 DIAGNOSIS — I10 Essential (primary) hypertension: Secondary | ICD-10-CM | POA: Diagnosis not present

## 2020-01-15 MED ORDER — VALSARTAN-HYDROCHLOROTHIAZIDE 160-25 MG PO TABS: 1.0000 | ORAL_TABLET | Freq: Every day | ORAL | 1 refills | Status: DC

## 2020-01-15 MED ORDER — VALACYCLOVIR HCL 1 G PO TABS: 1000.0000 mg | ORAL_TABLET | Freq: Three times a day (TID) | ORAL | 3 refills | Status: DC

## 2020-01-15 NOTE — Patient Instructions (Signed)
Preventive Care 38 Years and Older, Female Preventive care refers to lifestyle choices and visits with your health care provider that can promote health and wellness. This includes:  A yearly physical exam. This is also called an annual well check.  Regular dental and eye exams.  Immunizations.  Screening for certain conditions.  Healthy lifestyle choices, such as diet and exercise. What can I expect for my preventive care visit? Physical exam Your health care provider will check:  Height and weight. These may be used to calculate body mass index (BMI), which is a measurement that tells if you are at a healthy weight.  Heart rate and blood pressure.  Your skin for abnormal spots. Counseling Your health care provider may ask you questions about:  Alcohol, tobacco, and drug use.  Emotional well-being.  Home and relationship well-being.  Sexual activity.  Eating habits.  History of falls.  Memory and ability to understand (cognition).  Work and work Statistician.  Pregnancy and menstrual history. What immunizations do I need?  Influenza (flu) vaccine  This is recommended every year. Tetanus, diphtheria, and pertussis (Tdap) vaccine  You may need a Td booster every 10 years. Varicella (chickenpox) vaccine  You may need this vaccine if you have not already been vaccinated. Zoster (shingles) vaccine  You may need this after age 33. Pneumococcal conjugate (PCV13) vaccine  One dose is recommended after age 71. Pneumococcal polysaccharide (PPSV23) vaccine  One dose is recommended after age 71. Measles, mumps, and rubella (MMR) vaccine  You may need at least one dose of MMR if you were born in 1957 or later. You may also need a second dose. Meningococcal conjugate (MenACWY) vaccine  You may need this if you have certain conditions. Hepatitis A vaccine  You may need this if you have certain conditions or if you travel or work in places where you may be exposed  to hepatitis A. Hepatitis B vaccine  You may need this if you have certain conditions or if you travel or work in places where you may be exposed to hepatitis B. Haemophilus influenzae type b (Hib) vaccine  You may need this if you have certain conditions. You may receive vaccines as individual doses or as more than one vaccine together in one shot (combination vaccines). Talk with your health care provider about the risks and benefits of combination vaccines. What tests do I need? Blood tests  Lipid and cholesterol levels. These may be checked every 5 years, or more frequently depending on your overall health.  Hepatitis C test.  Hepatitis B test. Screening  Lung cancer screening. You may have this screening every year starting at age 39 if you have a 30-pack-year history of smoking and currently smoke or have quit within the past 15 years.  Colorectal cancer screening. All adults should have this screening starting at age 36 and continuing until age 15. Your health care provider may recommend screening at age 23 if you are at increased risk. You will have tests every 1-10 years, depending on your results and the type of screening test.  Diabetes screening. This is done by checking your blood sugar (glucose) after you have not eaten for a while (fasting). You may have this done every 1-3 years.  Mammogram. This may be done every 1-2 years. Talk with your health care provider about how often you should have regular mammograms.  BRCA-related cancer screening. This may be done if you have a family history of breast, ovarian, tubal, or peritoneal cancers.  Other tests  Sexually transmitted disease (STD) testing.  Bone density scan. This is done to screen for osteoporosis. You may have this done starting at age 44. Follow these instructions at home: Eating and drinking  Eat a diet that includes fresh fruits and vegetables, whole grains, lean protein, and low-fat dairy products. Limit  your intake of foods with high amounts of sugar, saturated fats, and salt.  Take vitamin and mineral supplements as recommended by your health care provider.  Do not drink alcohol if your health care provider tells you not to drink.  If you drink alcohol: ? Limit how much you have to 0-1 drink a day. ? Be aware of how much alcohol is in your drink. In the [place] one drink equals one 12 oz bottle of beer (355 mL), one 5 oz glass of wine (148 mL), or one 1 oz glass of hard liquor (44 mL). Lifestyle  Take daily care of your teeth and gums.  Stay active. Exercise for at least 30 minutes on 5 or more days each week.  Do not use any products that contain nicotine or tobacco, such as cigarettes, e-cigarettes, and chewing tobacco. If you need help quitting, ask your health care provider.  If you are sexually active, practice safe sex. Use a condom or other form of protection in order to prevent STIs (sexually transmitted infections).  Talk with your health care provider about taking a low-dose aspirin or statin. What's next?  Go to your health care provider once a year for a well check visit.  Ask your health care provider how often you should have your eyes and teeth checked.  Stay up to date on all vaccines. This information is not intended to replace advice given to you by your health care provider. Make sure you discuss any questions you have with your health care provider. Document Revised: 03/20/2018 Document Reviewed: 03/20/2018 Elsevier Patient Education  2020 [employer].

## 2020-01-15 NOTE — Progress Notes (Signed)
Subjective:     Candace Bell is a 69 y.o. female and is here for a comprehensive physical exam. The patient reports no problems.  Social History   Socioeconomic History  . Marital status: Single    Spouse name: Not on file  . Number of children: Not on file  . Years of education: Not on file  . Highest education level: Not on file  Occupational History    Employer: [employer]    Comment: retired  Tobacco Use  . Smoking status: Never Smoker  . Smokeless tobacco: Never Used  Substance and Sexual Activity  . Alcohol use: Yes    Alcohol/week: 0.0 standard drinks    Comment: Occasional wine monthly  . Drug use: No  . Sexual activity: Not Currently    Partners: Male  Other Topics Concern  . Not on file  Social History Narrative   Exercise-- gym 3x a week    Social Determinants of Health   Financial Resource Strain: Low Risk   . Difficulty of Paying Living Expenses: Not hard at all  Food Insecurity: No Food Insecurity  . Worried About Charity fundraiser in the Last Year: Never true  . Ran Out of Food in the Last Year: Never true  Transportation Needs: No Transportation Needs  . Lack of Transportation (Medical): No  . Lack of Transportation (Non-Medical): No  Physical Activity:   . Days of Exercise per Week: Not on file  . Minutes of Exercise per Session: Not on file  Stress:   . Feeling of Stress : Not on file  Social Connections:   . Frequency of Communication with Friends and Family: Not on file  . Frequency of Social Gatherings with Friends and Family: Not on file  . Attends Religious Services: Not on file  . Active Member of Clubs or Organizations: Not on file  . Attends Archivist Meetings: Not on file  . Marital Status: Not on file  Intimate Partner Violence:   . Fear of Current or Ex-Partner: Not on file  . Emotionally Abused: Not on file  . Physically Abused: Not on file  . Sexually Abused: Not on file   Health Maintenance  Topic  Date Due  . Hepatitis C Screening  Never done  . TETANUS/TDAP  Never done  . PNA vac Low Risk Adult (2 of 2 - PCV13) 08/01/2014  . COLONOSCOPY  03/24/2019  . INFLUENZA VACCINE  11/08/2019  . COVID-19 Vaccine (2 - Pfizer 2-dose series) 12/22/2019  . MAMMOGRAM  01/11/2021  . DEXA SCAN  Completed    The following portions of the patient's history were reviewed and updated as appropriate:  She  has a past medical history of Arthritis, Hypertension, and Osteopenia (10/13/2015). She does not have any pertinent problems on file. She  has a past surgical history that includes Back surgery; Abdominal hysterectomy (1993); Cataract extraction (Bilateral, 2015); and Eye surgery (Bilateral, 06/2013). Her family history includes Colon cancer in her mother; Colon polyps in her sister; Dementia in her father; Diabetes in her brother and mother; Stroke in her brother. She  reports that she has never smoked. She has never used smokeless tobacco. She reports current alcohol use. She reports that she does not use drugs. She has a current medication list which includes the following prescription(s): meloxicam, tramadol, valacyclovir, and valsartan-hydrochlorothiazide. Current Outpatient Medications on File Prior to Visit  Medication Sig Dispense Refill  . meloxicam (MOBIC) 15 MG tablet Take 1 tablet (  15 mg total) by mouth daily. 90 tablet 1  . traMADol (ULTRAM) 50 MG tablet Take 1 tablet (50 mg total) by mouth every 6 (six) hours as needed. 30 tablet 0   No current facility-administered medications on file prior to visit.   She is allergic to amoxicillin, other, and penicillins..  Review of Systems Review of Systems  Constitutional: Negative for activity change, appetite change and fatigue.  HENT: Negative for hearing loss, congestion, tinnitus and ear discharge.  dentist q83m Eyes: Negative for visual disturbance (see optho q1y -- vision corrected to 20/20 with glasses).  Respiratory: Negative for cough,  chest tightness and shortness of breath.   Cardiovascular: Negative for chest pain, palpitations and leg swelling.  Gastrointestinal: Negative for abdominal pain, diarrhea, constipation and abdominal distention.  Genitourinary: Negative for urgency, frequency, decreased urine volume and difficulty urinating.  Musculoskeletal: Negative for back pain, arthralgias and gait problem.  Skin: Negative for color change, pallor and rash.  Neurological: Negative for dizziness, light-headedness, numbness and headaches.  Hematological: Negative for adenopathy. Does not bruise/bleed easily.  Psychiatric/Behavioral: Negative for suicidal ideas, confusion, sleep disturbance, self-injury, dysphoric mood, decreased concentration and agitation.       Objective:    BP 132/86 (BP Location: Right Arm, Cuff Size: Normal)   Pulse 84   Temp 99.1 F (37.3 C) (Oral)   Resp 12   Ht 5\' 6"  (1.676 m)   Wt 169 lb 9.6 oz (76.9 kg)   SpO2 96%   BMI 27.37 kg/m  General appearance: alert, cooperative, appears stated age and no distress Head: Normocephalic, without obvious abnormality, atraumatic Eyes: negative findings: lids and lashes normal, conjunctivae and sclerae normal and pupils equal, round, reactive to light and accomodation Ears: normal TM's and external ear canals both ears Neck: no adenopathy, no carotid bruit, no JVD, supple, symmetrical, trachea midline and thyroid not enlarged, symmetric, no tenderness/mass/nodules Back: symmetric, no curvature. ROM normal. No CVA tenderness. Lungs: clear to auscultation bilaterally Breasts: normal appearance, no masses or tenderness Heart: regular rate and rhythm, S1, S2 normal, no murmur, click, rub or gallop Abdomen: soft, non-tender; bowel sounds normal; no masses,  no organomegaly Pelvic: not indicated; status post hysterectomy, negative ROS Extremities: extremities normal, atraumatic, no cyanosis or edema Pulses: 2+ and symmetric Skin: Skin color, texture,  turgor normal. No rashes or lesions Lymph nodes: Cervical, supraclavicular, and axillary nodes normal. Neurologic: Alert and oriented X 3, normal strength and tone. Normal symmetric reflexes. Normal coordination and gait    Assessment:    Healthy female exam.      Plan:    ghm utd Check labs  See After Visit Summary for Counseling Recommendations    1. Essential hypertension Well controlled, no changes to meds. Encouraged heart healthy diet such as the DASH diet and exercise as tolerated.   - valsartan-hydrochlorothiazide (DIOVAN-HCT) 160-25 MG tablet; Take 1 tablet by mouth daily.  Dispense: 90 tablet; Refill: 1 - Lipid panel - Comprehensive metabolic panel  2. Need for influenza vaccination   - Flu Vaccine QUAD High Dose(Fluad)  3. Estrogen deficiency   - DG Bone Density; Future  4. Colon cancer screening   - Ambulatory referral to Gastroenterology  5. Need for hepatitis C screening test   - Hepatitis C antibody

## 2020-01-16 LAB — LIPID PANEL: Total CHOL/HDL Ratio: 2.9 (calc) (ref <5.0)

## 2020-01-18 DIAGNOSIS — Z1231 Encounter for screening mammogram for malignant neoplasm of breast: Secondary | ICD-10-CM | POA: Diagnosis not present

## 2020-01-18 LAB — HM MAMMOGRAPHY

## 2020-01-18 LAB — COMPREHENSIVE METABOLIC PANEL
AG Ratio: 1.4 (calc) (ref 1.0–2.5)
ALT: 12 U/L (ref 6–29)
AST: 15 U/L (ref 10–35)
Albumin: 4.2 g/dL (ref 3.6–5.1)
Alkaline phosphatase (APISO): 68 U/L (ref 37–153)
BUN: 21 mg/dL (ref 7–25)
CO2: 29 mmol/L (ref 20–32)
Calcium: 9.9 mg/dL (ref 8.6–10.4)
Chloride: 103 mmol/L (ref 98–110)
Creat: 0.86 mg/dL (ref 0.60–0.93)
Globulin: 3.1 g/dL (calc) (ref 1.9–3.7)
Glucose, Bld: 81 mg/dL (ref 65–99)
Potassium: 3.7 mmol/L (ref 3.5–5.3)
Sodium: 141 mmol/L (ref 135–146)
Total Bilirubin: 0.7 mg/dL (ref 0.2–1.2)
Total Protein: 7.3 g/dL (ref 6.1–8.1)

## 2020-01-18 LAB — HEPATITIS C ANTIBODY
Hepatitis C Ab: NONREACTIVE
SIGNAL TO CUT-OFF: 0.01 (ref <1.00)

## 2020-01-18 LAB — LIPID PANEL
Cholesterol: 144 mg/dL (ref <200)
HDL: 50 mg/dL (ref >=50)
LDL Cholesterol (Calc): 72 mg/dL (calc)
Non-HDL Cholesterol (Calc): 94 mg/dL (calc) (ref <130)
Triglycerides: 140 mg/dL (ref <150)

## 2020-01-19 ENCOUNTER — Telehealth: Payer: Self-pay | Admitting: Family Medicine

## 2020-01-19 NOTE — Telephone Encounter (Signed)
Printed dexa order and faxed to [facility].

## 2020-01-19 NOTE — Telephone Encounter (Signed)
Caller name:Nevayah Call back number:[phone]  Patient states [facility] did not have bone density referral.

## 2020-01-26 DIAGNOSIS — Z780 Asymptomatic menopausal state: Secondary | ICD-10-CM | POA: Diagnosis not present

## 2020-01-26 DIAGNOSIS — Z13820 Encounter for screening for osteoporosis: Secondary | ICD-10-CM | POA: Diagnosis not present

## 2020-01-26 LAB — HM DEXA SCAN: HM Dexa Scan: NORMAL

## 2020-01-29 ENCOUNTER — Encounter: Payer: Self-pay | Admitting: Family Medicine

## 2020-02-09 ENCOUNTER — Telehealth: Payer: Self-pay | Admitting: Family Medicine

## 2020-02-09 NOTE — Telephone Encounter (Signed)
Patient would like to know her dexa scan results

## 2020-02-10 NOTE — Telephone Encounter (Signed)
Patient is calling back. Please call her at [phone]

## 2020-02-10 NOTE — Telephone Encounter (Signed)
Patient called in again about her results.

## 2020-02-10 NOTE — Telephone Encounter (Signed)
Attempted to call patient. Phone kept ringing. No Vm picked up

## 2020-02-11 NOTE — Telephone Encounter (Signed)
Pt called. Advised on bone density results.

## 2020-03-07 ENCOUNTER — Encounter: Payer: Self-pay | Admitting: Family Medicine

## 2020-03-07 ENCOUNTER — Ambulatory Visit (INDEPENDENT_AMBULATORY_CARE_PROVIDER_SITE_OTHER): Payer: Medicare Other | Admitting: Family Medicine

## 2020-03-07 ENCOUNTER — Ambulatory Visit (HOSPITAL_BASED_OUTPATIENT_CLINIC_OR_DEPARTMENT_OTHER)
Admission: RE | Admit: 2020-03-07 | Discharge: 2020-03-07 | Disposition: A | Discharge status: Home / Self Care | Payer: Medicare Other | Source: Ambulatory Visit | Attending: Family Medicine | Admitting: Family Medicine

## 2020-03-07 ENCOUNTER — Other Ambulatory Visit: Payer: Self-pay

## 2020-03-07 VITALS — BP 138/88 | HR 84 | Temp 98.1°F | Resp 16 | Ht 66.0 in | Wt 163.2 lb

## 2020-03-07 DIAGNOSIS — M179 Osteoarthritis of knee, unspecified: Secondary | ICD-10-CM

## 2020-03-07 DIAGNOSIS — R630 Anorexia: Secondary | ICD-10-CM

## 2020-03-07 DIAGNOSIS — R413 Other amnesia: Secondary | ICD-10-CM | POA: Diagnosis not present

## 2020-03-07 DIAGNOSIS — R42 Dizziness and giddiness: Secondary | ICD-10-CM

## 2020-03-07 DIAGNOSIS — R0602 Shortness of breath: Secondary | ICD-10-CM | POA: Diagnosis not present

## 2020-03-07 DIAGNOSIS — R531 Weakness: Secondary | ICD-10-CM

## 2020-03-07 DIAGNOSIS — R5383 Other fatigue: Secondary | ICD-10-CM | POA: Diagnosis not present

## 2020-03-07 DIAGNOSIS — M1710 Unilateral primary osteoarthritis, unspecified knee: Secondary | ICD-10-CM

## 2020-03-07 DIAGNOSIS — R634 Abnormal weight loss: Secondary | ICD-10-CM

## 2020-03-07 DIAGNOSIS — M1711 Unilateral primary osteoarthritis, right knee: Secondary | ICD-10-CM | POA: Insufficient documentation

## 2020-03-07 DIAGNOSIS — I10 Essential (primary) hypertension: Secondary | ICD-10-CM

## 2020-03-07 DIAGNOSIS — F320 Major depressive disorder, single episode, mild: Secondary | ICD-10-CM

## 2020-03-07 DIAGNOSIS — Z86010 Personal history of colonic polyps: Secondary | ICD-10-CM

## 2020-03-07 LAB — CBC WITH DIFFERENTIAL/PLATELET
Basophils Absolute: 0.1 10*3/uL (ref 0.0–0.1)
Basophils Relative: 1.4 % (ref 0.0–3.0)
Eosinophils Absolute: 0.1 10*3/uL (ref 0.0–0.7)
Eosinophils Relative: 2.8 % (ref 0.0–5.0)
HCT: 39.1 % (ref 36.0–46.0)
Hemoglobin: 13.1 g/dL (ref 12.0–15.0)
Lymphocytes Relative: 32.7 % (ref 12.0–46.0)
Lymphs Abs: 1.7 10*3/uL (ref 0.7–4.0)
MCHC: 33.7 g/dL (ref 30.0–36.0)
MCV: 85.1 fl (ref 78.0–100.0)
Monocytes Absolute: 0.4 10*3/uL (ref 0.1–1.0)
Monocytes Relative: 7.4 % (ref 3.0–12.0)
Neutro Abs: 2.8 10*3/uL (ref 1.4–7.7)
Neutrophils Relative %: 55.7 % (ref 43.0–77.0)
Platelets: 217 10*3/uL (ref 150.0–400.0)
RBC: 4.59 Mil/uL (ref 3.87–5.11)
RDW: 13.5 % (ref 11.5–15.5)
WBC: 5.1 10*3/uL (ref 4.0–10.5)

## 2020-03-07 LAB — COMPREHENSIVE METABOLIC PANEL
ALT: 11 U/L (ref 0–35)
AST: 16 U/L (ref 0–37)
Albumin: 4.2 g/dL (ref 3.5–5.2)
Alkaline Phosphatase: 56 U/L (ref 39–117)
BUN: 18 mg/dL (ref 6–23)
CO2: 30 mEq/L (ref 19–32)
Calcium: 9.8 mg/dL (ref 8.4–10.5)
Chloride: 101 mEq/L (ref 96–112)
Creatinine, Ser: 0.71 mg/dL (ref 0.40–1.20)
GFR: 85.32 mL/min (ref >60.00)
Glucose, Bld: 93 mg/dL (ref 70–99)
Potassium: 3.7 mEq/L (ref 3.5–5.1)
Sodium: 140 mEq/L (ref 135–145)
Total Bilirubin: 1 mg/dL (ref 0.2–1.2)
Total Protein: 7.4 g/dL (ref 6.0–8.3)

## 2020-03-07 LAB — VITAMIN B12: Vitamin B-12: 562 pg/mL (ref 211–911)

## 2020-03-07 LAB — TSH: TSH: 1.39 u[IU]/mL (ref 0.35–4.50)

## 2020-03-07 MED ORDER — MELOXICAM 15 MG PO TABS: 15.0000 mg | ORAL_TABLET | Freq: Every day | ORAL | 1 refills | Status: DC

## 2020-03-07 MED ORDER — MIRTAZAPINE 15 MG PO TABS: 15.0000 mg | ORAL_TABLET | Freq: Every day | ORAL | 5 refills | Status: DC

## 2020-03-07 NOTE — Assessment & Plan Note (Signed)
Well controlled, no changes to meds. Encouraged heart healthy diet such as the DASH diet and exercise as tolerated.  °

## 2020-03-07 NOTE — Progress Notes (Signed)
Patient ID: Candace Bell, female    DOB: 1950-06-21  Age: 69 y.o. MRN: [id number]    Subjective:  Subjective  HPI Candace Bell presents with her daughter c/o sob, weakness , dec appetite and memory loss ---  She has not gotten lost while driving but her daughter states they have had some concerns about her memory and it has been worsening slightly over the last several months No chest pain , no falls  No congestion   Review of Systems  Constitutional: Positive for fatigue. Negative for appetite change, diaphoresis and unexpected weight change.  Eyes: Negative for pain, redness and visual disturbance.  Respiratory: Negative for cough, chest tightness, shortness of breath and wheezing.   Cardiovascular: Negative for chest pain, palpitations and leg swelling.  Endocrine: Negative for cold intolerance, heat intolerance, polydipsia, polyphagia and polyuria.  Genitourinary: Negative for difficulty urinating, dysuria and frequency.  Neurological: Positive for weakness. Negative for dizziness, light-headedness, numbness and headaches.  Psychiatric/Behavioral: Positive for confusion, decreased concentration and dysphoric mood. Negative for self-injury, sleep disturbance and suicidal ideas.    History Past Medical History:  Diagnosis Date  . Arthritis   . Hypertension   . Osteopenia 10/13/2015    She has a past surgical history that includes Back surgery; Abdominal hysterectomy (1993); Cataract extraction (Bilateral, 2015); and Eye surgery (Bilateral, 06/2013).   Her family history includes Colon cancer in her mother; Colon polyps in her sister; Dementia in her father; Diabetes in her brother and mother; Stroke in her brother.She reports that she has never smoked. She has never used smokeless tobacco. She reports current alcohol use. She reports that she does not use drugs.  Current Outpatient Medications on File Prior to Visit  Medication Sig Dispense Refill  . valACYclovir (VALTREX) 1000  MG tablet Take 1 tablet (1,000 mg total) by mouth 3 (three) times daily. 21 tablet 3  . valsartan-hydrochlorothiazide (DIOVAN-HCT) 160-25 MG tablet Take 1 tablet by mouth daily. 90 tablet 1   No current facility-administered medications on file prior to visit.     Objective:  Objective  Physical Exam Vitals and nursing note reviewed.  Constitutional:      Appearance: She is well-developed.  HENT:     Head: Normocephalic and atraumatic.  Eyes:     Conjunctiva/sclera: Conjunctivae normal.  Neck:     Thyroid: No thyromegaly.     Vascular: No carotid bruit or JVD.  Cardiovascular:     Rate and Rhythm: Normal rate and regular rhythm.     Heart sounds: Normal heart sounds. No murmur heard.   Pulmonary:     Effort: Pulmonary effort is normal. No respiratory distress.     Breath sounds: Normal breath sounds. No wheezing or rales.  Chest:     Chest wall: No tenderness.  Musculoskeletal:     Cervical back: Normal range of motion and neck supple.  Neurological:     Mental Status: She is alert and oriented to person, place, and time.    BP 138/88 (BP Location: Left Arm, Patient Position: Sitting, Cuff Size: Normal)   Pulse 84   Temp 98.1 F (36.7 C) (Oral)   Resp 16   Ht 5\' 6"  (1.676 m)   Wt 163 lb 3.2 oz (74 kg)   SpO2 99%   BMI 26.34 kg/m  Wt Readings from Last 3 Encounters:  03/07/20 163 lb 3.2 oz (74 kg)  01/15/20 169 lb 9.6 oz (76.9 kg)  05/26/19 169 lb 9.6 oz (76.9 kg)  Lab Results  Component Value Date   WBC 5.1 03/07/2020   HGB 13.1 03/07/2020   HCT 39.1 03/07/2020   PLT 217.0 03/07/2020   GLUCOSE 93 03/07/2020   CHOL 144 01/15/2020   TRIG 140 01/15/2020   HDL 50 01/15/2020   LDLCALC 72 01/15/2020   ALT 11 03/07/2020   AST 16 03/07/2020   NA 140 03/07/2020   K 3.7 03/07/2020   CL 101 03/07/2020   CREATININE 0.71 03/07/2020   BUN 18 03/07/2020   CO2 30 03/07/2020   TSH 1.39 03/07/2020   HGBA1C 6.0 04/30/2018   MICROALBUR <0.7 08/19/2014    DG  HIP UNILAT WITH PELVIS 2-3 VIEWS LEFT  Result Date: 06/19/2018 CLINICAL DATA:  Left hip pain for 1 week, no known injury, initial encounter EXAM: DG HIP (WITH OR WITHOUT PELVIS) 3V LEFT COMPARISON:  None. FINDINGS: Pelvic ring is intact. Degenerative changes of the hip joints are noted bilaterally. No acute fracture or dislocation is seen. No soft tissue abnormality is noted. IMPRESSION: No acute abnormality noted. Electronically Signed   By: Inez Catalina M.D.   On: 06/19/2018 16:12     Assessment & Plan:  Plan  I have discontinued Elizabeth Sauer. Strausser's traMADol. I am also having her start on mirtazapine. Additionally, I am having her maintain her valsartan-hydrochlorothiazide, valACYclovir, and meloxicam.  Meds ordered this encounter  Medications  . mirtazapine (REMERON) 15 MG tablet    Sig: Take 1 tablet (15 mg total) by mouth at bedtime.    Dispense:  30 tablet    Refill:  5  . meloxicam (MOBIC) 15 MG tablet    Sig: Take 1 tablet (15 mg total) by mouth daily.    Dispense:  90 tablet    Refill:  1    Problem List Items Addressed This Visit      Unprioritized   Depression, major, single episode, mild ([place]as[place])   Relevant Medications   mirtazapine (REMERON) 15 MG tablet   Essential hypertension    Well controlled, no changes to meds. Encouraged heart healthy diet such as the DASH diet and exercise as tolerated.       Loss of appetite    Encouraged pt to eat more Use ensure/ boost or carnation instant breakfast F/u 2 week s      Relevant Medications   mirtazapine (REMERON) 15 MG tablet   Other Relevant Orders   CBC with Differential/Platelet (Completed)   TSH (Completed)   Vitamin B12 (Completed)   Comprehensive metabolic panel (Completed)   DG Chest 2 View (Completed)   Ambulatory referral to Gastroenterology   Memory loss - Primary    Recently worsening Check labs  Refer to neuro psych  Consider Mri       Relevant Orders   Ambulatory referral to Neuropsychology   CBC  with Differential/Platelet (Completed)   TSH (Completed)   Vitamin B12 (Completed)   Comprehensive metabolic panel (Completed)   Osteoarthritis of knee   Relevant Medications   meloxicam (MOBIC) 15 MG tablet   Other fatigue    Check labs  ? Related to depression      Relevant Orders   CBC with Differential/Platelet (Completed)   TSH (Completed)   Vitamin B12 (Completed)   Comprehensive metabolic panel (Completed)   SOB (shortness of breath)    cxr normal  ekg-- unable to get the lead s to all stick to pt Check echo       Relevant Orders   EKG 12-Lead (Completed)  DG Chest 2 View (Completed)   ECHOCARDIOGRAM COMPLETE    Other Visit Diagnoses    Light headed       Relevant Orders   EKG 12-Lead (Completed)   Weight loss       Relevant Orders   Ambulatory referral to Gastroenterology   History of colon polyps       Relevant Orders   Ambulatory referral to Gastroenterology      Follow-up: Return in about 2 weeks (around 03/21/2020), or if symptoms worsen or fail to improve, for weight , memory .  [name], DO

## 2020-03-07 NOTE — Patient Instructions (Signed)

## 2020-03-07 NOTE — Assessment & Plan Note (Addendum)
Check labs  ? Related to depression

## 2020-03-07 NOTE — Assessment & Plan Note (Addendum)
Recently worsening Check labs  Refer to neuro psych  Consider Mri

## 2020-03-07 NOTE — Assessment & Plan Note (Signed)
Encouraged pt to eat more Use ensure/ boost or carnation instant breakfast F/u 2 week s

## 2020-03-07 NOTE — Assessment & Plan Note (Signed)
cxr normal  ekg-- unable to get the lead s to all stick to pt Check echo

## 2020-03-08 ENCOUNTER — Other Ambulatory Visit: Payer: Self-pay | Admitting: *Deleted

## 2020-03-08 ENCOUNTER — Telehealth: Payer: Self-pay | Admitting: Family Medicine

## 2020-03-08 DIAGNOSIS — I10 Essential (primary) hypertension: Secondary | ICD-10-CM

## 2020-03-08 MED ORDER — VALSARTAN-HYDROCHLOROTHIAZIDE 160-25 MG PO TABS: 1.0000 | ORAL_TABLET | Freq: Every day | ORAL | 1 refills | Status: DC

## 2020-03-08 NOTE — Telephone Encounter (Signed)
Patient states all her labs came back normal, Patient states she belief the reason she is not feeling well could be related to her blood pressure medication

## 2020-03-09 ENCOUNTER — Encounter: Payer: Self-pay | Admitting: Counselor

## 2020-03-09 NOTE — Telephone Encounter (Signed)
Please advise 

## 2020-03-10 MED ORDER — AMLODIPINE BESYLATE 5 MG PO TABS: 5.0000 mg | ORAL_TABLET | Freq: Every day | ORAL | 2 refills | Status: DC

## 2020-03-10 NOTE — Addendum Note (Signed)
Addended by: Sanda Linger on: 03/10/2020 09:22 AM   Modules accepted: Orders

## 2020-03-10 NOTE — Telephone Encounter (Signed)
Spoke with patient. Pt verbalized understanding. New med sent in. Pt has f/u appt with [facility] on 12/16.

## 2020-03-10 NOTE — Telephone Encounter (Signed)
D/c diovan/ hct and start norvasc 5 mg --  1 po qd  #30  2 refills ----bp check in 2 weeks

## 2020-03-11 ENCOUNTER — Other Ambulatory Visit: Payer: Self-pay

## 2020-03-11 ENCOUNTER — Ambulatory Visit (HOSPITAL_COMMUNITY)
Admission: RE | Admit: 2020-03-11 | Discharge: 2020-03-11 | Disposition: A | Discharge status: Home / Self Care | Payer: Medicare Other | Source: Ambulatory Visit | Attending: Family Medicine | Admitting: Family Medicine

## 2020-03-11 DIAGNOSIS — R0602 Shortness of breath: Secondary | ICD-10-CM | POA: Diagnosis not present

## 2020-03-11 DIAGNOSIS — E785 Hyperlipidemia, unspecified: Secondary | ICD-10-CM | POA: Insufficient documentation

## 2020-03-11 DIAGNOSIS — I10 Essential (primary) hypertension: Secondary | ICD-10-CM | POA: Insufficient documentation

## 2020-03-11 LAB — ECHOCARDIOGRAM COMPLETE
Area-P 1/2: 4.49 cm2
S' Lateral: 3.5 cm

## 2020-03-12 ENCOUNTER — Other Ambulatory Visit: Payer: Self-pay | Admitting: Family Medicine

## 2020-03-12 DIAGNOSIS — R0600 Dyspnea, unspecified: Secondary | ICD-10-CM

## 2020-03-16 DIAGNOSIS — M1990 Unspecified osteoarthritis, unspecified site: Secondary | ICD-10-CM | POA: Insufficient documentation

## 2020-03-16 DIAGNOSIS — I10 Essential (primary) hypertension: Secondary | ICD-10-CM | POA: Insufficient documentation
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
## 2020-03-21 ENCOUNTER — Other Ambulatory Visit: Payer: Self-pay

## 2020-03-21 ENCOUNTER — Encounter: Payer: Self-pay | Admitting: Cardiology

## 2020-03-21 ENCOUNTER — Ambulatory Visit: Payer: Medicare Other | Admitting: Cardiology

## 2020-03-21 VITALS — BP 120/81 | HR 69 | Ht 66.0 in | Wt 165.0 lb

## 2020-03-21 DIAGNOSIS — R5381 Other malaise: Secondary | ICD-10-CM

## 2020-03-21 DIAGNOSIS — I10 Essential (primary) hypertension: Secondary | ICD-10-CM | POA: Diagnosis not present

## 2020-03-21 DIAGNOSIS — R0602 Shortness of breath: Secondary | ICD-10-CM | POA: Diagnosis not present

## 2020-03-21 DIAGNOSIS — R079 Chest pain, unspecified: Secondary | ICD-10-CM | POA: Diagnosis not present

## 2020-03-21 DIAGNOSIS — R5383 Other fatigue: Secondary | ICD-10-CM | POA: Diagnosis not present

## 2020-03-21 MED ORDER — METOPROLOL TARTRATE 100 MG PO TABS: 100.0000 mg | ORAL_TABLET | Freq: Once | ORAL | 0 refills | Status: DC

## 2020-03-21 NOTE — Patient Instructions (Signed)
Medication Instructions:  Your physician recommends that you continue on your current medications as directed. Please refer to the Current Medication list given to you today.  *If you need a refill on your cardiac medications before your next appointment, please call your pharmacy*   Lab Work: Your physician recommends that you return for lab work in: TODAY BMP, ProBNP, D-dimer If you have labs (blood work) drawn today and your tests are completely normal, you will receive your results only by: Marland Kitchen MyChart Message (if you have MyChart) OR . A paper copy in the mail If you have any lab test that is abnormal or we need to change your treatment, we will call you to review the results.   Testing/Procedures: Your cardiac CT will be scheduled at the below location:   [facility] [address] [place], [place] [address] [phone]  If scheduled at [facility], please arrive at the [facility] main entrance of [facility] 30 minutes prior to test start time. Proceed to the [facility] Radiology Department (first floor) to check-in and test prep.  Please follow these instructions carefully (unless otherwise directed):   On the Night Before the Test: . Be sure to Drink plenty of water. . Do not consume any caffeinated/decaffeinated beverages or chocolate 12 hours prior to your test. . Do not take any antihistamines 12 hours prior to your test.  On the Day of the Test: . Drink plenty of water. Do not drink any water within one hour of the test. . Do not eat any food 4 hours prior to the test. . You may take your regular medications prior to the test.  . Take metoprolol (Lopressor) two hours prior to test. . FEMALES- please wear underwire-free bra if available       After the Test: . Drink plenty of water. . After receiving IV contrast, you may experience a mild flushed feeling. This is normal. . On occasion, you may experience a mild rash up to 24 hours  after the test. This is not dangerous. If this occurs, you can take Benadryl 25 mg and increase your fluid intake. . If you experience trouble breathing, this can be serious. If it is severe call 911 IMMEDIATELY. If it is mild, please call our office. . If you take any of these medications: Glipizide/Metformin, Avandament, Glucavance, please do not take 48 hours after completing test unless otherwise instructed.   Once we have confirmed authorization from your insurance company, we will call you to set up a date and time for your test. Based on how quickly your insurance processes prior authorizations requests, please allow up to 4 weeks to be contacted for scheduling your Cardiac CT appointment. Be advised that routine Cardiac CT appointments could be scheduled as many as 8 weeks after your provider has ordered it.  For non-scheduling related questions, please contact the cardiac imaging nurse navigator should you have any questions/concerns: Marchia Bond, Cardiac Imaging Nurse Navigator Burley Saver, Interim Cardiac Imaging Nurse [place] [facility] Services Direct Office Dial: [phone]   For scheduling needs, including cancellations and rescheduling, please call [place], [phone].     Follow-Up: At [facility], you and your health needs are our priority.  As part of our continuing mission to provide you with exceptional heart care, we have created designated Provider Care Teams.  These Care Teams include your primary Cardiologist (physician) and Advanced Practice Providers (APPs -  Physician Assistants and Nurse Practitioners) who  all work together to provide you with the care you need, when you need it.  We recommend signing up for the patient portal called "MyChart".  Sign up information is provided on this After Visit Summary.  MyChart is used to connect with patients for Virtual Visits (Telemedicine).  Patients are able to view lab/test results, encounter  notes, upcoming appointments, etc.  Non-urgent messages can be sent to your provider as well.   To learn more about what you can do with MyChart, go to [email or web address].    Your next appointment:   6 week(s)  The format for your next appointment:   In Person  Provider:   Shirlee More, MD   Other Instructions

## 2020-03-22 ENCOUNTER — Telehealth: Payer: Self-pay | Admitting: Counselor

## 2020-03-22 ENCOUNTER — Telehealth: Payer: Self-pay

## 2020-03-22 LAB — BASIC METABOLIC PANEL
BUN/Creatinine Ratio: 23 (ref 12–28)
BUN: 17 mg/dL (ref 8–27)
CO2: 23 mmol/L (ref 20–29)
Calcium: 9.5 mg/dL (ref 8.7–10.3)
Chloride: 107 mmol/L — ABNORMAL HIGH (ref 96–106)
Creatinine, Ser: 0.73 mg/dL (ref 0.57–1.00)
GFR calc Af Amer: 96 mL/min/{1.73_m2} (ref >59)
GFR calc non Af Amer: 83 mL/min/{1.73_m2} (ref >59)
Glucose: 103 mg/dL — ABNORMAL HIGH (ref 65–99)
Potassium: 4.1 mmol/L (ref 3.5–5.2)
Sodium: 143 mmol/L (ref 134–144)

## 2020-03-22 LAB — D-DIMER, QUANTITATIVE: D-DIMER: 0.55 mg/L FEU — ABNORMAL HIGH (ref 0.00–0.49)

## 2020-03-22 LAB — PRO B NATRIURETIC PEPTIDE: NT-Pro BNP: 202 pg/mL (ref 0–301)

## 2020-03-22 NOTE — Telephone Encounter (Signed)
Patient called in wanting to make sure that it is ok for her to keep taking her Prevogen before her Neuropsych testing on 04/20/20?

## 2020-03-22 NOTE — Telephone Encounter (Signed)
In general, I do not recommend that my patients take prevagen at all, but it will not adversely influence the test findings if that is what she is wondering.

## 2020-03-22 NOTE — Telephone Encounter (Signed)
-----   Message from Berniece Salines, DO sent at 03/22/2020  9:31 AM EST ----- Labs normal.

## 2020-03-22 NOTE — Telephone Encounter (Signed)
Spoke with patient regarding results and recommendation.  Patient verbalizes understanding and is agreeable to plan of care. Advised patient to call back with any issues or concerns.  

## 2020-03-24 ENCOUNTER — Ambulatory Visit (HOSPITAL_BASED_OUTPATIENT_CLINIC_OR_DEPARTMENT_OTHER)
Admission: RE | Admit: 2020-03-24 | Discharge: 2020-03-24 | Disposition: A | Discharge status: Home / Self Care | Payer: Medicare Other | Source: Ambulatory Visit | Attending: Family Medicine | Admitting: Family Medicine

## 2020-03-24 ENCOUNTER — Ambulatory Visit: Payer: Medicare Other | Admitting: Family Medicine

## 2020-03-24 ENCOUNTER — Encounter (HOSPITAL_BASED_OUTPATIENT_CLINIC_OR_DEPARTMENT_OTHER): Payer: Self-pay

## 2020-03-24 ENCOUNTER — Encounter: Payer: Self-pay | Admitting: Family Medicine

## 2020-03-24 ENCOUNTER — Other Ambulatory Visit: Payer: Self-pay

## 2020-03-24 ENCOUNTER — Ambulatory Visit (INDEPENDENT_AMBULATORY_CARE_PROVIDER_SITE_OTHER): Payer: Medicare Other | Admitting: Family Medicine

## 2020-03-24 VITALS — BP 132/88 | HR 93 | Temp 98.7°F | Resp 18 | Ht 66.0 in | Wt 166.4 lb

## 2020-03-24 DIAGNOSIS — R630 Anorexia: Secondary | ICD-10-CM | POA: Diagnosis not present

## 2020-03-24 DIAGNOSIS — R413 Other amnesia: Secondary | ICD-10-CM

## 2020-03-24 DIAGNOSIS — I10 Essential (primary) hypertension: Secondary | ICD-10-CM

## 2020-03-24 DIAGNOSIS — K449 Diaphragmatic hernia without obstruction or gangrene: Secondary | ICD-10-CM | POA: Diagnosis not present

## 2020-03-24 DIAGNOSIS — R0602 Shortness of breath: Secondary | ICD-10-CM

## 2020-03-24 DIAGNOSIS — R7989 Other specified abnormal findings of blood chemistry: Secondary | ICD-10-CM | POA: Insufficient documentation

## 2020-03-24 DIAGNOSIS — I2699 Other pulmonary embolism without acute cor pulmonale: Secondary | ICD-10-CM | POA: Diagnosis not present

## 2020-03-24 MED ORDER — IOHEXOL 350 MG/ML SOLN
100.0000 mL | Freq: Once | INTRAVENOUS | Status: AC | PRN
  Administered 2020-03-24: 15:00:00 100 mL via INTRAVENOUS

## 2020-03-24 NOTE — Assessment & Plan Note (Signed)
neuo app pending

## 2020-03-24 NOTE — Assessment & Plan Note (Signed)
Well controlled, no changes to meds. Encouraged heart healthy diet such as the DASH diet and exercise as tolerated.  °

## 2020-03-24 NOTE — Assessment & Plan Note (Signed)
Gaining weight  remined pt to con't to eat enough

## 2020-03-24 NOTE — Progress Notes (Signed)
Patient ID: TAQUILLA DOWNUM, female    DOB: [birth year]  Age: 69 y.o. MRN: [id number]    Subjective:  Subjective  HPI SHANDA CADOTTE presents for f/u weight loss and memory.   Neurology app is pending .   She gained 3 lbs since last ov.  She saw cardiology for sob-----cardiac ct pending Pt c/o sob with exertion.  No cp/ palpitations  D dimer was elevated   Review of Systems  Constitutional: Negative for appetite change, diaphoresis, fatigue and unexpected weight change.  Eyes: Negative for pain, redness and visual disturbance.  Respiratory: Negative for cough, chest tightness, shortness of breath and wheezing.   Cardiovascular: Negative for chest pain, palpitations and leg swelling.  Endocrine: Negative for cold intolerance, heat intolerance, polydipsia, polyphagia and polyuria.  Genitourinary: Negative for difficulty urinating, dysuria and frequency.  Neurological: Positive for weakness. Negative for dizziness, light-headedness, numbness and headaches.    History Past Medical History:  Diagnosis Date  . Arthritis   . Hypertension   . Osteopenia 10/13/2015    She has a past surgical history that includes Back surgery; Abdominal hysterectomy (1993); Cataract extraction (Bilateral, 2015); and Eye surgery (Bilateral, 06/2013).   Her family history includes Colon cancer in her mother; Colon polyps in her sister; Dementia in her father; Diabetes in her brother and mother; Stroke in her brother.She reports that she has never smoked. She has never used smokeless tobacco. She reports current alcohol use. She reports that she does not use drugs.  Current Outpatient Medications on File Prior to Visit  Medication Sig Dispense Refill  . amLODipine (NORVASC) 5 MG tablet Take 1 tablet (5 mg total) by mouth daily. 30 tablet 2  . Apoaequorin (PREVAGEN PO) Take 1 tablet by mouth daily.    . meloxicam (MOBIC) 15 MG tablet Take 1 tablet (15 mg total) by mouth daily. 90 tablet 1  . mirtazapine (REMERON)  15 MG tablet Take 1 tablet (15 mg total) by mouth at bedtime. 30 tablet 5  . metoprolol tartrate (LOPRESSOR) 100 MG tablet Take 1 tablet (100 mg total) by mouth once for 1 dose. Take two hours prior to your cardiac CT 1 tablet 0   No current facility-administered medications on file prior to visit.     Objective:  Objective  Physical Exam Vitals and nursing note reviewed.  Constitutional:      Appearance: She is well-developed and well-nourished.  HENT:     Head: Normocephalic and atraumatic.  Eyes:     Extraocular Movements: EOM normal.     Conjunctiva/sclera: Conjunctivae normal.  Neck:     Thyroid: No thyromegaly.     Vascular: No carotid bruit or JVD.  Cardiovascular:     Rate and Rhythm: Normal rate and regular rhythm.     Heart sounds: Normal heart sounds. No murmur heard.   Pulmonary:     Effort: Pulmonary effort is normal. No respiratory distress.     Breath sounds: Normal breath sounds. No wheezing or rales.  Chest:     Chest wall: No tenderness.  Musculoskeletal:        General: No edema.     Cervical back: Normal range of motion and neck supple.  Neurological:     Mental Status: She is alert and oriented to person, place, and time.  Psychiatric:        Mood and Affect: Mood and affect normal.    BP 132/88 (BP Location: Right Arm, Patient Position: Sitting, Cuff Size: Normal)   Pulse  93   Temp 98.7 F (37.1 C) (Oral)   Resp 18   Ht 5\' 6"  (1.676 m)   Wt 166 lb 6.4 oz (75.5 kg)   SpO2 97%   BMI 26.86 kg/m  Wt Readings from Last 3 Encounters:  03/24/20 166 lb 6.4 oz (75.5 kg)  03/21/20 165 lb (74.8 kg)  03/07/20 163 lb 3.2 oz (74 kg)     Lab Results  Component Value Date   WBC 5.1 03/07/2020   HGB 13.1 03/07/2020   HCT 39.1 03/07/2020   PLT 217.0 03/07/2020   GLUCOSE 103 (H) 03/21/2020   CHOL 144 01/15/2020   TRIG 140 01/15/2020   HDL 50 01/15/2020   LDLCALC 72 01/15/2020   ALT 11 03/07/2020   AST 16 03/07/2020   NA 143 03/21/2020   K 4.1  03/21/2020   CL 107 (H) 03/21/2020   CREATININE 0.73 03/21/2020   BUN 17 03/21/2020   CO2 23 03/21/2020   TSH 1.39 03/07/2020   HGBA1C 6.0 04/30/2018   MICROALBUR <0.7 08/19/2014    ECHOCARDIOGRAM COMPLETE  Result Date: 03/11/2020    ECHOCARDIOGRAM REPORT   Patient Name:   ALYSSAH ALGEO [facility] Date of Exam: 03/11/2020 Medical Rec #:  [id number]       Height:       66.0 in Accession #:    [id number]      Weight:       163.2 lb Date of Birth:  [birth year]       BSA:          1.834 m Patient Age:    20 years        BP:           157/84 mmHg Patient Gender: F               HR:           73 bpm. Exam Location:  Inpatient Procedure: 2D Echo Indications:    dyspnea 786.09  History:        Patient has no prior history of Echocardiogram examinations.                 Signs/Symptoms:Shortness of Breath; Risk Factors:Hypertension                 and Dyslipidemia.  Sonographer:    Johny Chess RDCS Referring Phys: [place]  1. Left ventricular ejection fraction, by estimation, is 55 to 60%. The left ventricle has normal function. The left ventricle has no regional wall motion abnormalities. Left ventricular diastolic parameters are consistent with Grade I diastolic dysfunction (impaired relaxation).  2. Right ventricular systolic function is normal. The right ventricular size is normal. There is normal pulmonary artery systolic pressure.  3. The mitral valve is normal in structure. No evidence of mitral valve regurgitation. No evidence of mitral stenosis.  4. The aortic valve is normal in structure. Aortic valve regurgitation is not visualized. No aortic stenosis is present.  5. The inferior vena cava is normal in size with greater than 50% respiratory variability, suggesting right atrial pressure of 3 mmHg. FINDINGS  Left Ventricle: Left ventricular ejection fraction, by estimation, is 55 to 60%. The left ventricle has normal function. The left ventricle has no regional wall motion  abnormalities. The left ventricular internal cavity size was normal in size. There is  no left ventricular hypertrophy. Left ventricular diastolic parameters are consistent with Grade I diastolic dysfunction (impaired relaxation). Indeterminate filling pressures. Right  Ventricle: The right ventricular size is normal. No increase in right ventricular wall thickness. Right ventricular systolic function is normal. There is normal pulmonary artery systolic pressure. The tricuspid regurgitant velocity is 2.16 m/s, and  with an assumed right atrial pressure of 3 mmHg, the estimated right ventricular systolic pressure is 85.2 mmHg. Left Atrium: Left atrial size was normal in size. Right Atrium: Right atrial size was normal in size. Pericardium: There is no evidence of pericardial effusion. Mitral Valve: The mitral valve is normal in structure. No evidence of mitral valve regurgitation. No evidence of mitral valve stenosis. Tricuspid Valve: The tricuspid valve is normal in structure. Tricuspid valve regurgitation is trivial. No evidence of tricuspid stenosis. Aortic Valve: The aortic valve is normal in structure. Aortic valve regurgitation is not visualized. No aortic stenosis is present. Pulmonic Valve: The pulmonic valve was normal in structure. Pulmonic valve regurgitation is not visualized. No evidence of pulmonic stenosis. Aorta: The aortic root is normal in size and structure. Venous: The inferior vena cava is normal in size with greater than 50% respiratory variability, suggesting right atrial pressure of 3 mmHg. IAS/Shunts: No atrial level shunt detected by color flow Doppler.  LEFT VENTRICLE PLAX 2D LVIDd:         4.60 cm  Diastology LVIDs:         3.50 cm  LV e' medial:    6.96 cm/s LV PW:         0.90 cm  LV E/e' medial:  11.5 LV IVS:        0.80 cm  LV e' lateral:   7.07 cm/s LVOT diam:     1.90 cm  LV E/e' lateral: 11.3 LV SV:         47 LV SV Index:   26 LVOT Area:     2.84 cm  RIGHT VENTRICLE             IVC  RV S prime:     14.70 cm/s  IVC diam: 1.40 cm TAPSE (M-mode): 2.0 cm LEFT ATRIUM             Index       RIGHT ATRIUM          Index LA diam:        3.70 cm 2.02 cm/m  RA Area:     8.21 cm LA Vol (A2C):   35.4 ml 19.30 ml/m RA Volume:   13.90 ml 7.58 ml/m LA Vol (A4C):   34.7 ml 18.92 ml/m LA Biplane Vol: 35.0 ml 19.08 ml/m  AORTIC VALVE LVOT Vmax:   83.60 cm/s LVOT Vmean:  57.200 cm/s LVOT VTI:    0.166 m  AORTA Ao Root diam: 2.80 cm Ao Asc diam:  3.30 cm MITRAL VALVE               TRICUSPID VALVE MV Area (PHT): 4.49 cm    TR Peak grad:   18.7 mmHg MV Decel Time: 169 msec    TR Vmax:        216.00 cm/s MV E velocity: 80.10 cm/s MV A velocity: 96.80 cm/s  SHUNTS MV E/A ratio:  0.83        Systemic VTI:  0.17 m                            Systemic Diam: 1.90 cm Dani Gobble Croitoru MD Electronically signed by Sanda Klein MD Signature Date/Time: 03/11/2020/5:41:28 PM  Final      Assessment & Plan:  Plan  I am having [name] maintain her mirtazapine, meloxicam, amLODipine, Apoaequorin (PREVAGEN PO), and metoprolol tartrate.  No orders of the defined types were placed in this encounter.   Problem List Items Addressed This Visit      Unprioritized   Essential hypertension    Well controlled, no changes to meds. Encouraged heart healthy diet such as the DASH diet and exercise as tolerated.       Loss of appetite    Gaining weight  remined pt to con't to eat enough       Memory loss    neuo app pending       SOB (shortness of breath)    D dimer elevated Check CT chest  Cardiac ct pending        Other Visit Diagnoses    Positive D dimer    -  Primary   Relevant Orders   CT Angio Chest W/Cm &/Or Wo Cm (Completed)      Follow-up: No follow-ups on file.  [name], DO

## 2020-03-24 NOTE — Assessment & Plan Note (Signed)
D dimer elevated Check CT chest  Cardiac ct pending

## 2020-03-28 ENCOUNTER — Ambulatory Visit: Payer: Medicare Other | Admitting: Cardiology

## 2020-03-30 ENCOUNTER — Other Ambulatory Visit: Payer: Self-pay | Admitting: Family Medicine

## 2020-03-30 ENCOUNTER — Telehealth: Payer: Self-pay | Admitting: Cardiology

## 2020-03-30 DIAGNOSIS — R630 Anorexia: Secondary | ICD-10-CM

## 2020-03-30 DIAGNOSIS — F320 Major depressive disorder, single episode, mild: Secondary | ICD-10-CM

## 2020-03-30 NOTE — Telephone Encounter (Signed)
Patient calling to follow up on the prior authorization for her CT. Patient is frustrated and feels she is getting the run around, since she has not heard anything.

## 2020-04-07 ENCOUNTER — Telehealth (HOSPITAL_COMMUNITY): Payer: Self-pay | Admitting: Emergency Medicine

## 2020-04-07 NOTE — Telephone Encounter (Signed)
Reaching out to patient to offer assistance regarding upcoming cardiac imaging study; pt verbalizes understanding of appt date/time, parking situation and where to check in, pre-test NPO status and medications ordered, and verified current allergies; name and call back number provided for further questions should they arise Rockwell Alexandria RN Navigator Cardiac Imaging [facility] [phone] office [phone] cell  Pt to take 100mg  metop 2 hr prior to scan 

## 2020-04-11 ENCOUNTER — Other Ambulatory Visit: Payer: Self-pay

## 2020-04-11 ENCOUNTER — Ambulatory Visit (HOSPITAL_COMMUNITY)
Admission: RE | Admit: 2020-04-11 | Discharge: 2020-04-11 | Disposition: A | Discharge status: Home / Self Care | Payer: Medicare Other | Source: Ambulatory Visit | Attending: Cardiology | Admitting: Cardiology

## 2020-04-11 DIAGNOSIS — K449 Diaphragmatic hernia without obstruction or gangrene: Secondary | ICD-10-CM | POA: Diagnosis not present

## 2020-04-11 DIAGNOSIS — Z006 Encounter for examination for normal comparison and control in clinical research program: Secondary | ICD-10-CM

## 2020-04-11 DIAGNOSIS — I700 Atherosclerosis of aorta: Secondary | ICD-10-CM | POA: Insufficient documentation

## 2020-04-11 DIAGNOSIS — I208 Other forms of angina pectoris: Secondary | ICD-10-CM | POA: Diagnosis not present

## 2020-04-11 DIAGNOSIS — R079 Chest pain, unspecified: Secondary | ICD-10-CM | POA: Diagnosis not present

## 2020-04-11 MED ORDER — IOHEXOL 350 MG/ML SOLN
80.0000 mL | Freq: Once | INTRAVENOUS | Status: AC
  Administered 2020-04-11: 80 mL via INTRAVENOUS

## 2020-04-11 MED ORDER — NITROGLYCERIN 0.4 MG SL SUBL
SUBLINGUAL_TABLET | SUBLINGUAL | Status: AC
  Filled 2020-04-11: qty 2

## 2020-04-11 MED ORDER — NITROGLYCERIN 0.4 MG SL SUBL
0.8000 mg | SUBLINGUAL_TABLET | Freq: Once | SUBLINGUAL | Status: AC
  Administered 2020-04-11: 0.8 mg via SUBLINGUAL

## 2020-04-11 NOTE — Research (Signed)
IDENTIFY Informed Consent   Subject Name: Candace Bell  Subject met inclusion and exclusion criteria.  The informed consent form, study requirements and expectations were reviewed with the subject and questions and concerns were addressed prior to the signing of the consent form.  The subject verbalized understanding of the trial requirements.  The subject agreed to participate in the IDENTIFY trial and signed the informed consent at 1108 on 03/JAN/2022.  The informed consent was obtained prior to performance of any protocol-specific procedures for the subject.  A copy of the signed informed consent was given to the subject and a copy was placed in the subject's medical record.   Sherlyn Lees

## 2020-04-11 NOTE — Progress Notes (Signed)
CT scan completed. Tolerated well. D/C home ambulatory with daughter, awake and alert. In no distress

## 2020-04-12 ENCOUNTER — Telehealth: Payer: Self-pay | Admitting: Family Medicine

## 2020-04-12 ENCOUNTER — Telehealth: Payer: Self-pay

## 2020-04-12 NOTE — Telephone Encounter (Signed)
Pt called she would like a call back in reference to her heart doctor pt did not want to elaborate    Caller # [phone]

## 2020-04-12 NOTE — Telephone Encounter (Signed)
Spoke with patient regarding results and recommendation.  Patient verbalizes understanding and is agreeable to plan of care. Advised patient to call back with any issues or concerns.  

## 2020-04-12 NOTE — Telephone Encounter (Signed)
-----   Message from Thomasene Ripple, DO sent at 04/12/2020  9:15 AM EST ----- Randie Heinz news, your calcium score is 0, your coronary CTA does not show any evidence of coronary artery disease.  The noncardiac portion of the test showed very tiny hiatal hernia and there is aortic atherosclerosis.

## 2020-04-13 NOTE — Telephone Encounter (Signed)
Spoke w/ Pt- she informed that she had CT coronary on 04/11/2020, was normal per cardiology office other than a small hiatal hernia. Her SOB has improved some since you saw her on 03/24/2020 but she wanted to know what else can be done.

## 2020-04-13 NOTE — Progress Notes (Signed)
Order(s) created erroneously. Erroneous order ID: [id number]  Order moved by: Margarette Canada D  Order move date/time: 04/13/2020 5:01 PM  Source Patient: [id number]  Source Contact: 03/07/2020  Destination Patient: [id number]  Destination Contact: 11/26/2019

## 2020-04-14 NOTE — Telephone Encounter (Signed)
We can refer to pulmonary since her heart is not the issue ---  they would check her lungs with some breathing tests We also waiting to see what neuro says If she agrees to referral --- ok to place

## 2020-04-15 NOTE — Telephone Encounter (Signed)
Patient is looking for a return call.

## 2020-04-19 NOTE — Telephone Encounter (Signed)
Patient advised, and will call back if she decides to get a referral to pulmonology. -JMA

## 2020-04-20 ENCOUNTER — Encounter: Payer: Self-pay | Admitting: Counselor

## 2020-04-20 ENCOUNTER — Ambulatory Visit: Payer: Medicare Other | Admitting: Psychology

## 2020-04-20 ENCOUNTER — Ambulatory Visit (INDEPENDENT_AMBULATORY_CARE_PROVIDER_SITE_OTHER): Payer: Medicare Other | Admitting: Counselor

## 2020-04-20 ENCOUNTER — Other Ambulatory Visit: Payer: Self-pay

## 2020-04-20 DIAGNOSIS — R413 Other amnesia: Secondary | ICD-10-CM

## 2020-04-20 DIAGNOSIS — F09 Unspecified mental disorder due to known physiological condition: Secondary | ICD-10-CM

## 2020-04-20 NOTE — Progress Notes (Unsigned)
[facility]  Patient Name: Candace Bell MRN: [id number] Date of Birth: 1950-05-25 Age: 70 y.o. Education: 12 years  Measurement properties of test scores: IQ, Index, and Standard Scores (SS): Mean = 100; Standard Deviation = 15 Scaled Scores (Ss): Mean = 10; Standard Deviation = 3 Z scores (Z): Mean = 0; Standard Deviation = 1 T scores (T); Mean = 50; Standard Deviation = 10  TEST SCORES:    Note: This summary of test scores accompanies the interpretive report and should not be interpreted by unqualified individuals or in isolation without reference to the report. Test scores are relative to age, gender, and educational history as available and appropriate.   Performance Validity        "A" Random Letter Test Raw  Descriptor      Errors 0 Within Expectation  The Dot Counting Test: 11 Within Expectation  NAB Effort Index 3 Below Expectation      Mental Status Screening     Total Score Descriptor  MMSE 24 MCI      Expected Functioning        Wide Range Achievement Test: Standard/Scaled Score Percentile      Word Reading 82 12      [employer] Intellectual Screening Test Standard/T-score Percentile      Guess What 36 8      Odd Item Out 55 69  RIST Index 94 34      Attention/Processing Speed        Neuropsychological Assessment Battery (Attention Module, Form 1): T-score Percentile      Digits Forward 46 34      Digits Backwards 46 34      Repeatable Battery for the Assessment of Neuropsychological Status (Form A): Scaled Score Percentile      Coding 4 2      Language        Neuropsychological Assessment Battery (Language Module, Form 1): T-score Percentile      Naming   (28) 42 21      Verbal Fluency: T-score Percentile      Controlled Oral Word Association (F-A-S) 44 27      Semantic Fluency (Animals) 39 14      Memory:        Neuropsychological Assessment Battery (Memory Module, Form 1): T-score Percentile      List  Learning           List A Immediate Recall   (3, 4, 5) 31 3         List B Immediate Recall   (4) 51 54         List A Short Delayed Recall   (0) 22 <1         List A Long Delayed Recall   (0) 29 2         List A Percent Retention   (0 %) --- <1         List A Long Delayed Yes/No Recognition Hits   (9) --- 3         List A Long Delayed Yes/No Recognition False Alarms   (11) --- 3         List A Recognition Discriminability Index --- <1      Story Learning           Immediate Recall   (20, 22) 34 5         Delayed Recall   (25) 41 18  Percent Retention   (114 %) --- 79      Daily Living Memory            Immediate Recall   (20, 14) 40 16          Delayed Recall   (5, 5) 40 16          Percent Retention (71 %) --- 18          Recognition Hits    (8) --- 27      Repeatable Battery for the Assessment of Neuropsychological Status (Form A): Scaled Score Percentile         Figure Recall   (6) 6 9      Visuospatial/Constructional Functioning        Repeatable Battery for the Assessment of Neuropsychological Status (Form A): Standard/Scaled Score Percentile     Visuospatial/Constructional Index 75 5         Figure Copy   (15) 5 5         Judgment of Line Orientation   (14) --- 17-25      Executive Functioning        [employer] Test (MWCST): Standard/T-Score Percentile      Number of Categories Correct 40 16      Number of Perseverative Errors 44 27      Number of Total Errors 44 27      Percent Perseverative Errors 50 50  Executive Function Composite 87 19      Trail Making Test: T-Score Percentile      Part A 52 58      Part B 45 31      [place] Diagnostic Aphasia Exam: Raw Score Scaled Score      Complex Ideational Material 11 9      Clock Drawing Raw Score Descriptor      Command 9 WNL      Rating Scales        Clinical Dementia Rating Raw Score Descriptor      Sum of Boxes 1.5 MCI      Global Score 0.5 MCI      Quick Dementia Rating System  Raw Score Descriptor      Sum of Boxes 1 MCI      Total Score 1.5 MCI  Geriatric Depression Scale - Short Form 3 Negative    Clista Rainford V. Nicole Kindred PsyD, [facility] Neuropsychologist

## 2020-04-20 NOTE — Progress Notes (Signed)
   Psychometrist Note   Cognitive testing was administered to Candace Bell by Milana Kidney, B.S. (Technician) under the supervision of Alphonzo Severance, Psy.D., ABN. [name] was able to tolerate all test procedures. Dr. Nicole Kindred met with the patient as needed to manage any emotional reactions to the testing procedures. Rest breaks were offered.    The battery of tests administered was selected by Dr. Nicole Kindred with consideration to the patient's current level of functioning, the nature of her symptoms, emotional and behavioral responses during the interview, level of literacy, observed level of motivation/effort, and the nature of the referral question. This battery was communicated to the psychometrist. Communication between Dr. Nicole Kindred and the psychometrist was ongoing throughout the evaluation and Dr. Nicole Kindred was immediately accessible at all times. Dr. Nicole Kindred provided supervision to the technician on the date of this service, to the extent necessary to assure the quality of all services provided.    Ms. Emery will return in approximately one week for an interactive feedback session with Dr. Nicole Kindred, at which time test performance, clinical impressions, and treatment recommendations will be reviewed in detail. The patient understands she can contact our office should she require our assistance before this time.   A total of 130 minutes of billable time were spent with Candace Bell by the technician, including test administration and scoring time. Billing for these services is reflected in Dr. Les Pou note.   This note reflects time spent with the psychometrician and does not include test scores, clinical history, or any interpretations made by Dr. Nicole Kindred. The full report will follow in a separate note.

## 2020-04-20 NOTE — Progress Notes (Signed)
NEUROPSYCHOLOGICAL EVALUATION [facility]  Patient Name: Candace Bell MRN: [id number] Date of Birth: Jan 02, 1951 Age: 70 y.o. Education: 12 years  Referral Circumstances and Background Information  Ms. Candace Bell is a 70 y.o., right-hand dominant, divorced woman with a history of HTN, osteoarthritis, depression, and memory problems. She was referred by her PCP Dr. Zola Button with [facility] at [facility].    On interview, the patient reported that she is concered about her memory and thinking and has noticed cognitive changes as she has aged. Her father had Alzheimer's disease, so she is familiar with the condition and worries she may be developing it. Her mother died about 3 or 4 years ago and she noticed some minor problems at that time. She thought it might be related to stress. As the years have gone past, she feels like it has gotten worse. Her day-to-day issues include forgetfulness, such as forgetting things that people have told her. She also has gotten lost on a few occasions when driving. Her daughter Candace Bell has noticed that she repeats herself and she will talk about the same topic multiple times without recalling that they have discussed it. She denied any significant problems with orientation to time. She is more dependent on a calendar for tracking appointments and keeping herself on track. The patient stated that her mood is generally fairly good, although she does get down at times related to her memory problems. She is on mirtazapine but stated that she doesn't take it regularly, she likes to avoid taking medications if she can. She thinks her energy is fairly good, although she did have an episode of illness recently and is still recovering. She reported that she sleeps "fine" although she worked third shift for many years so her sleep schedule is different. She will sleep from 9pm-1am and then get up for several hours, then go back to sleep from 3am  - 7am. She had diminished appetite although it is better now.   With respect to functioning, she stated that she is still managing her own finances and does reliably. Nobody checks behind her so there could be issues, but they haven't noticed anything being turned off or any delinquincies. She still drives, as above, she has gotten lost a few times recently. She drove to [place] alone to visit her daughter and stated that she wouldn't do that again now. She is still able to cook meals, operate appliances, and the like. She has no difficulty with chores. She generally does fairly well with judgment and problem solving, she always researches things extensively and is very diligent. She has no issues functioning in the community and attends church, she is able to go to the grocery store. She remembers and organizes her own medications with no problems.   Past Medical History and Review of Relevant Studies   Patient Active Problem List   Diagnosis Date Noted  . Hypertension   . Arthritis   . Other fatigue 03/07/2020  . Loss of appetite 03/07/2020  . SOB (shortness of breath) 03/07/2020  . Depression, major, single episode, mild (HCC) 03/07/2020  . Osteoarthritis of knee 03/07/2020  . Left hand pain 10/21/2018  . Hyperlipidemia LDL goal <100 01/26/2018  . Memory loss 04/16/2017  . Osteopenia 10/13/2015  . Back pain 11/25/2013  . Essential hypertension 02/29/2012   Review of Neuroimaging and Relevant Medical History: The patient has no neuroimaging on file with the exception of an MRI from 10/28/2001.  There is no recent mental status examination, the patient does have an MMSE from 04/2017 that was 30/30.  The patient denied any history of seizures, strokes, or brain surgeries.   Current Outpatient Medications  Medication Sig Dispense Refill  . amLODipine (NORVASC) 5 MG tablet Take 1 tablet (5 mg total) by mouth daily. 30 tablet 2  . Apoaequorin (PREVAGEN PO) Take 1 tablet by mouth  daily. (Patient not taking: Reported on 04/11/2020)    . meloxicam (MOBIC) 15 MG tablet Take 1 tablet (15 mg total) by mouth daily. 90 tablet 1  . metoprolol tartrate (LOPRESSOR) 100 MG tablet Take 1 tablet (100 mg total) by mouth once for 1 dose. Take two hours prior to your cardiac CT 1 tablet 0  . mirtazapine (REMERON) 15 MG tablet TAKE 1 TABLET BY MOUTH EVERYDAY AT BEDTIME 90 tablet 2   No current facility-administered medications for this visit.    Family History  Problem Relation Age of Onset  . Diabetes Mother   . Colon cancer Mother   . Diabetes Brother   . Stroke Brother   . Dementia Father   . Colon polyps Sister     There is a family history of dementia. He developed it later in life, in his mid to late 53s. Her mother also had cognitive difficulties of some kind, although she lived until 48. There is no  family history of psychiatric illness.  Psychosocial History  Developmental, Educational and Employment History: The patient reported that she was a good student who was never held back, didn't have any learning problems, and earned good grades. She graduated high school and went to work after that. 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 She has mainly worked in [employer] positions.   Psychiatric History: The patient is denying much in the way of depression or mental health history. She has never consulted with a psychiatrist, not sure where her depression diagnosis came from.   Substance Use History: The patient does not drink much, a glass of wine from time to time. She does not use nicotine. She does not use any illicit substances.   Relationship History and Living Cimcumstances: The patient was married for 20 years and was divorced many years ago, in '89. She has two daughters, one who lives in [place] and she lives with her other daughter in [place].    Mental Status and Behavioral Observations  Sensorium/Arousal: The patient's level of arousal was awake and alert. Hearing and vision were adequate with correction (glasses) for testing purposes. Orientation: The patient was alert and fully oriented to person, place, time, and date.  Appearance: Dressed in appropriate, casual clothing with reasonable grooming and hygiene Behavior: Pleasant, appropriate, presented as a good historian and had good insight Speech/language: Normal in rate, rhythm, volume, and prosody. No word finding pauses or paraphasia noted.  Gait/Posture: The patient's gait was normal, narrow based, and stable with good stride length, turning, and armswings.  Movement: No overt signs/symptoms of movement problems noted.  Social Comportment: The patient was pleasant and appropriate Mood: "I'm pretty good" Affect: Mainly neutral Thought process/content: Thought process was logical and goal oriented. She had no delusions and thought content was appropriate and pertinent.  Safety: No safety concerns identified in this euthymic patient with no significant mental health history.  Insight: [place] Exam 04/20/2020  04/16/2017  Orientation to time 5 5  Orientation to Place 5 5  Registration 3 3  Attention/ Calculation 5 5  Recall 2 3  Language- name 2 objects 2 2  Language- repeat 1 1  Language- follow 3 step command 3 3  Language- read & follow direction 1 1  Write a sentence 1 1  Copy design 0 1  Total score 28 30  MMSE with Serial 7's: 24/30  Test Procedures  Wide Range Achievement Test - 4             Word Reading Neuropsychological Assessment Battery  List Learning  Story Learning  Daily Living Memory  Naming  Digit Span Repeatable Battery for the Assessment of Neuropsychological Status (Form A)  Figure Copy  Judgment of Line Orientation  Coding  Figure Recall The Dot Counting Test A Random Letter Test Controlled Oral Word Association  (F-A-S) Semantic Fluency (Animals) Trail Making Test A & B Complex Ideational Material Modified [place] Card Sorting Test Geriatric Depression Scale - Short Form Quick Dementia Rating System (completed by daughter Danae Chen)  Plan  FAYTHE HEITZENRATER was seen for a psychiatric diagnostic evaluation and neuropsychological testing. This is a pleasant, 70 year old, divorced woman with a history of some mild memory loss over the past 3 or 4 years. She thinks her issues have been worsening over time. She lives with her daughter and has support but is essentially independent and functioning at her normal level in all areas. She does report some getting lost, but she has been able to find her way eventually and she also got lost sometimes in the past. She is screening anywhere from an MCI to mild dementia level of function on the MMSE, with much better "world" than serial 7's performance. She still has good insight. Full and complete note with impressions, recommendations, and interpretation of test data to follow.   Viviano Simas Nicole Kindred, PsyD, [place] Clinical Neuropsychologist  Informed Consent and Coding/Compliance  Risks and benefits of the evaluation were discussed with the patient prior to all testing procedures. I conducted a clinical interview and neuropsychological testing (at least two tests) with [name] and Milana Kidney, B.S. (Technician) administered additional test procedures. The patient was able to tolerate the testing procedures and the patient (and/or family if applicable) is likely to benefit from further follow up to receive the diagnosis and treatment recommendations, which will be rendered at the next encounter. Billing below reflects technician time, my direct face-to-face time with the patient, time spent in test administration, and time spent in professional activities including but not limited to: neuropsychological test interpretation, integration of neuropsychological test data  with clinical history, report preparation, treatment planning, care coordination, and review of diagnostically pertinent medical history or studies.   Services associated with this encounter: Clinical Interview [phone]) plus 60 minutes (27741; Neuropsychological Evaluation by Professional)  110 minutes (28786; Neuropsychological Evaluation by Professional, Adl.) 16 minutes ([id number]; Test Administration by Professional) 30 minutes ([id number]; Neuropsychological Testing by Technician) 100 minutes ([id number]; Neuropsychological Testing by Technician, Adl.)

## 2020-04-22 NOTE — Progress Notes (Signed)
Candace Bell  Patient Name: Candace Bell MRN: [id number] Date of Birth: [birth year] Age: 70 y.o. Education: 2 years  Clinical Impressions  Candace Bell is a 70 y.o., right-hand dominant, divorced woman with a history of HTN, osteoarthritis, and memory problems that she has noticed over the past 3 or 4 years. She is a patient of Candace Bell with [facility] at [facility] She perceives her issues to be worsening over time. Her daughter stated that she has a family history of Alzheimer's disease and she thinks that is where much of the patient's concern comes from. She has very mild day-to-day issues, although she is forgeting things that people have told her and her daughter notices her repeating the same conversations after several hours. She is still independent with respect to functioning with the exception of occasionally getting turned around when driving. She has always been able to find her way back and it does not sound as though there are any clear driving safety issues.   On neuropsychological testing, there is suggestion of weak single word reading and unusually low performance on a measure of overall verbal cognitive ability that is typically fairly resistant to acquired impairment. These findings temper expectations to some extent for her test performance. Nevertheless, her performance was below expectations for her on memory measures, with unusually low scores on word list learning, short story recall, and on delayed recall of visual information. She is also demonstrating weak constructional performance and diminished processing speed, suggesting emerging visuospatial dysfunction and processing speed problems. These findings are very subtle, however, and are more consistent with expectations in the setting of MCI than dementia. Her daughter rated her as functioning at a mild cognitive impairment level, which is concordant  with my CDR rating. She screened negative for depression.   Candace Bell is thus manifesting a very mild level of cognitive impairment with intact day-to-day functioning, consistent with an MCI level problem. I am not concerned about her driving safety given their report of difficulties and her test scores. She is very early on if this is a declining condition and now is an excellent time for her to initiate lifestyle change designed to promote cognitive longevity. She should also follow up in 1 to 2 years for further evaluation, to monitor her over time. Would recommend MRI of brain for full and complete workup and to assess vascular load (some of her findings could go with a vascular etiology). I do not see a need for medication at this time.   Diagnostic Impressions: Amnestic mild cognitive impairment  Recommendations to be discussed with patient  Your performance and presentation on neuropsychological assessment were overall quite benign. That is to say, domains of very significant or severe impairment. You did perform below expectations for you in some areas, however, including on measures of memory, on visuospatial construction (I.e., drawing), and on processing speed. I think that these findings are enough to substantiate a diagnosis of Mild Cognitive Impairment given your clinical history.   The major difference between mild cognitive impairment (MCI) and dementia is in severity and potential prognosis. Once someone reaches a level of severity adequate to be diagnosed with a dementia, there is usually progression over time, though this may be years. On the other hand, mild cognitive impairment, while a significant risk for dementia in future, does not always progress to dementia, and in some instances stays the same or can even revert to normal. It is important  to realize that if MCI is due to underlying Alzheimer's disease, it will most likely progress to dementia eventually. The rate of conversion  to Alzheimer's dementia from amnestic MCI is about 15% per year versus the general population risk of conversion of 2% per year.   In your case, your MCI could certainly be due to a developing condition, such as Alzheimer's disease, although it is very early on and at this point I would encourage you to simply monitor it and make healthy lifestyle changes that can meaningfully reduce the rate at which such a condition might progress.   I do not see a role for antidementia medication at this point in time, because your issues are very mild and the medication does not affect progression if this were due to something like Alzheimer's disease.   You should discuss your medications with Candace Bell and keep her up to date. If you are not taking the Mirtazapine and do not feel a need, I would encourage you to discuss that with her at your next appointment.   Prevagen is marketed by [employer] as a supplement to improve memory and support brain health. It contains Vitamin D3 and Apoaequorin. Vitamin D is a nutrient created by the body when it is exposed to sunlight. Apoaequorin is a type of protein found in jellyfish. Unfortunately, there is no credible evidence that Apoaequorin contributes to memory functioning or that it is even absorbed by the body after being taken orally. The maker of prevagen has been sued by the FDA related to "false and deceptive marketing practices." Therefore, I do not recommend this supplement to my patients and instead recommend that they stay healthy and mentally active and engage in other lifestyle changes that have been shown to be beneficial in numerous high quality studies.   Some cognitive abilities (such as processing speed) naturally decline with age, but there are many things you can do to contribute to healthy cognitive aging. There is evidence from at least one study that a modified low carbohydrate mediterranean diet (the MIND-DASH) diet can contribute to healthy  cognitive aging. There is also some evidence to suggest a beneficial effect of coffee (black coffee without sugar or other additives such as cream) for healthy aging. One glass of red wine a day may also contribute to healthy aging though at two drinks you lose all benefit and at three drinks it may be doing more harm than good. Staying active, mentally and physically, are also crucially important. One of the best ways to do this is simply to stay active and engaged in your life, particularly with social activities. Challenging the mind and other cognitively stimulating activities are encouraged, consider learning a new hobby, reconnecting with old friends, reading an interesting and thought provoking book. It is not so much what you do that is important as it is that you enjoy it and stay at it.   There is now good quality evidence from at least one large scale study that a modified mediterranean diet may help slow cognitive decline. This is known as the "MIND" diet. The Mind diet is not so much a specific diet as it is a set of recommendations for things that you should and should not eat.   Foods that are ENCOURAGED on the MIND Diet:  Green, leafy vegetables: Aim for six or more servings per week. This includes kale, spinach, cooked greens and salads.  All other vegetables: Try to eat another vegetable in addition to the  green leafy vegetables at least once a day. It is best to choose non-starchy vegetables because they have a lot of nutrients with a low number of calories.  Berries: Eat berries at least twice a week. There is a plethora of research on strawberries, and other berries such as blueberries, raspberries and blackberries have also been found to have antioxidant and brain health benefits.  Nuts: Try to get five servings of nuts or more each week. The creators of the [place] don't specify what kind of nuts to consume, but it is probably best to vary the type of nuts you eat to obtain a variety  of nutrients. Peanuts are a legume and do not fall into this category.  Olive oil: Use olive oil as your main cooking oil. There may be other heart-healthy alternatives such as algae oil, though there is not yet sufficient research upon which to base a formal recommendation.  Whole grains: Aim for at least three servings daily. Choose minimally processed grains like oatmeal, quinoa, brown rice, whole-wheat pasta and 100% whole-wheat bread.  Fish: Eat fish at least once a week. It is best to choose fatty fish like salmon, sardines, trout, tuna and mackerel for their high amounts of omega-3 fatty acids.  Beans: Include beans in at least four meals every week. This includes all beans, lentils and soybeans.  Poultry: Try to eat chicken or [place] at least twice a week. Note that fried chicken is not encouraged on the MIND diet.  Wine: Aim for no more than one glass of alcohol daily. Both red and white wine may benefit the brain. However, much research has focused on the red wine compound resveratrol, which may help protect against Alzheimer's disease.  Foods that are DISCOURAGED on the MIND Diet: Butter and margarine: Try to eat less than 1 tablespoon (about 14 grams) daily. Instead, try using olive oil as your primary cooking fat, and dipping your bread in olive oil with herbs.  Cheese: The MIND diet recommends limiting your cheese consumption to less than once per week.  Red meat: Aim for no more than three servings each week. This includes all beef, pork, lamb and products made from these meats.  Maceo Pro food: The MIND diet highly discourages fried food, especially the kind from fast-food restaurants. Limit your consumption to less than once per week.  Pastries and sweets: This includes most of the processed junk food and desserts you can think of. Ice cream, cookies, brownies, snack cakes, donuts, candy and more. Try to limit these to no more than four times a week.  Exercise is one of the best medicines  for promoting health and maintaining cognitive fitness at all stages in life. Exercise probably has the largest documented effect on brain health and performance of any lifestyle intervention. Studies have shown that even previously sedentary individuals who start exercising as late as age 65 show a significant survival benefit as compared to their non-exercising peers. In the [place], the current guidelines are for 30 minutes of moderate exercise per day, but increasing your activity level less than that may also be helpful. You do not have to get your 30 minutes of exercise in one shot and exercising for short periods of time spread throughout the day can be helpful. Go for several walks, learn to dance, or do something else you enjoy that gets your body moving. Of course, if you have an underlying medical condition or there is any question about whether it is safe for  you to exercise, you should consult a medical treatment provider prior to beginning exercise.   Test Findings  Test scores are summarized in additional documentation associated with this encounter. Test scores are relative to age, gender, and educational history as available and appropriate. There were no concerns about performance validity as all findings fell within normal expectations.   General Intellectual Functioning/Achievement:  Performance on single word reading was toward the low end of the low average range, which is a bit weak. Overall performance was average on the RIST index although her performance on the verbally oriented subtest was unusually low. These findings temper expectations for her test findings to some extent though they also could represent some possible decline.   Attention and Processing Efficiency: Performance on indicators of attention was reasonable with average digit repetition forward and backward. Timed number-symbol coding was extremely low whereas simple numeric sequencing was average.    Language: Performance on language measures was within gross limits of normal with low average visual object confrontation naming. Generation of words in response to the letters F-A-S was average while generation of words in response to the category prompt "animals" was low average.   Visuospatial Function: Performance was unusually low at a domain level on measures of visuospatial construction and visuospatial functioning. Her figure copy was unusually low, with some minor distortion, and she took a sufficient time to draw the figure so I think this may represent a bona-fide visuospatial issue. She did better on judgment of angular line orientations, which was low average to average.   Learning and Memory: Learning and memory measures fell below expectations for this patient; however, the extent of decline is fairly minimal and she does not have a complete storage issue. A developing storage issue is suspected.   In the verbal realm, she learned 3, 4, and 5 words of a 12-item word list across three learning trials, which is unusually low. He did not retain any of those words on short or long delayed recall, which is extremely low. Recognition cuing did not appreciably benefit her performance as she had more false positives than correct identifications. Short story learning was unusually low on immediate recall but in this case, she retained the information well and generated a low average score for delayed recall. Daily living memory was low average on immediate and delayed recall with comparable average range recognition performance.   Delayed recall of a modestly complex geometric figure was unusually low.   Executive Functions: Performance on executive indicators was reasonable with a low average score on the [employer]. She also performed at a good, average level on alternating sequencing of numbers and letters of the alphabet. Generation of  words in response to given letters was average. Reasoning with verbal information was average on the [employer]. Clock drawing was intact with the only error being no size difference between hands.   Rating Scale(s): Ms. Mcclish screened negative for the presence of depression. She was characterized as functioning at no more than an MCI level by her daughter, which is consistent with my CDR rating.   Viviano Simas Nicole Kindred PsyD, [place] Clinical Neuropsychologist

## 2020-04-27 ENCOUNTER — Encounter: Payer: Medicare Other | Admitting: Counselor

## 2020-05-05 ENCOUNTER — Encounter: Payer: Medicare Other | Admitting: Counselor

## 2020-05-09 ENCOUNTER — Ambulatory Visit: Payer: Medicare Other | Admitting: Cardiology

## 2020-05-10 ENCOUNTER — Other Ambulatory Visit: Payer: Self-pay

## 2020-05-10 ENCOUNTER — Encounter: Payer: Self-pay | Admitting: Counselor

## 2020-05-10 ENCOUNTER — Ambulatory Visit (INDEPENDENT_AMBULATORY_CARE_PROVIDER_SITE_OTHER): Payer: Medicare Other | Admitting: Counselor

## 2020-05-10 DIAGNOSIS — G3184 Mild cognitive impairment, so stated: Secondary | ICD-10-CM | POA: Diagnosis not present

## 2020-05-10 NOTE — Patient Instructions (Signed)
Your performance and presentation on neuropsychological assessment were overall quite benign. That is to say, domains of very significant or severe impairment. You did perform below expectations for you in some areas, however, including on measures of memory, on visuospatial construction (I.e., drawing), and on processing speed. I think that these findings are enough to substantiate a diagnosis of Mild Cognitive Impairment given your clinical history.   The major difference between mild cognitive impairment (MCI) and dementia is in severity and potential prognosis. Once someone reaches a level of severity adequate to be diagnosed with a dementia, there is usually progression over time, though this may be years. On the other hand, mild cognitive impairment, while a significant risk for dementia in future, does not always progress to dementia, and in some instances stays the same or can even revert to normal.It is important to realize that if MCI is due to underlying Alzheimer's disease, it will most likely progress to dementia eventually. The rate of conversion to Alzheimer's dementiafrom amnestic MCI is about 15% per year versus the general population risk of conversion of 2% per year.  In your case, your MCI could certainly be due to a developing condition, such as Alzheimer's disease, although it is very early on and at this point I would encourage you to simply monitor it and make healthy lifestyle changes that can meaningfully reduce the rate at which such a condition might progress.   I do not see a role for antidementia medication at this point in time, because your issues are very mild and the medication does not affect progression if this were due to something like Alzheimer's disease.   You should discuss your medications with Dr. Carollee Herter and keep her up to date. If you are not taking the Mirtazapine and do not feel a need, I would encourage you to discuss that with her at your next  appointment.   Prevagen is marketed by [employer] as a supplement to improve memory and support brain health. It contains Vitamin D3 and Apoaequorin. Vitamin D is a nutrient created by the body when it is exposed to sunlight. Apoaequorin is a type of protein found in jellyfish. Unfortunately, there is no credible evidence that Apoaequorin contributes to memory functioning or that it is even absorbed by the body after being taken orally. The maker of prevagen has been sued by the FDA related to "false and deceptive marketing practices." Therefore, I do not recommend this supplement to my patients and instead recommend that they stay healthy and mentally active and engage in other lifestyle changes that have been shown to be beneficial in numerous high quality studies.   Some cognitive abilities (such as processing speed) naturally decline with age, but there are many things you can do to contribute to healthy cognitive aging. There is evidence from at least one study that a modified low carbohydrate mediterranean diet (the MIND-DASH) diet can contribute to healthy cognitive aging. There is also some evidence to suggest a beneficial effect of coffee (black coffee without sugar or other additives such as cream) for healthy aging. One glass of red wine a day may also contribute to healthy aging though at two drinks you lose all benefit and at three drinks it may be doing more harm than good. Staying active, mentally and physically, are also crucially important. One of the best ways to do this is simply to stay active and engaged in your life, particularly with social activities. Challenging the mind and other cognitively stimulating  activities are encouraged, consider learning a new hobby, reconnecting with old friends, reading an interesting and thought provoking book. It is not so much what you do that is important as it is that you enjoy it and stay at it.   There is now good quality evidence from at  least one large scale study that a modified mediterranean diet may help slow cognitive decline. This is known as the "MIND" diet. The Mind diet is not so much a specific diet as it is a set of recommendations for things that you should and should not eat.   Foods that are ENCOURAGED on the MIND Diet:  Green, leafy vegetables: Aim for six or more servings per week. This includes kale, spinach, cooked greens and salads.  All other vegetables: Try to eat another vegetable in addition to the green leafy vegetables at least once a day. It is best to choose non-starchy vegetables because they have a lot of nutrients with a low number of calories.  Berries: Eat berries at least twice a week. There is a plethora of research on strawberries, and other berries such as blueberries, raspberries and blackberries have also been found to have antioxidant and brain health benefits.  Nuts: Try to get five servings of nuts or more each week. The creators of the [place] don't specify what kind of nuts to consume, but it is probably best to vary the type of nuts you eat to obtain a variety of nutrients. Peanuts are a legume and do not fall into this category.  Olive oil: Use olive oil as your main cooking oil. There may be other heart-healthy alternatives such as algae oil, though there is not yet sufficient research upon which to base a formal recommendation.  Whole grains: Aim for at least three servings daily. Choose minimally processed grains like oatmeal, quinoa, brown rice, whole-wheat pasta and 100% whole-wheat bread.  Fish: Eat fish at least once a week. It is best to choose fatty fish like salmon, sardines, trout, tuna and mackerel for their high amounts of omega-3 fatty acids.  Beans: Include beans in at least four meals every week. This includes all beans, lentils and soybeans.  Poultry: Try to eat chicken or [place] at least twice a week. Note that fried chicken is not encouraged on the MIND diet.  Wine: Aim  for no more than one glass of alcohol daily. Both red and white wine may benefit the brain. However, much research has focused on the red wine compound resveratrol, which may help protect against Alzheimer's disease.  Foods that are DISCOURAGED on the MIND Diet: Butter and margarine: Try to eat less than 1 tablespoon (about 14 grams) daily. Instead, try using olive oil as your primary cooking fat, and dipping your bread in olive oil with herbs.  Cheese: The MIND diet recommends limiting your cheese consumption to less than once per week.  Red meat: Aim for no more than three servings each week. This includes all beef, pork, lamb and products made from these meats.  Maceo Pro food: The MIND diet highly discourages fried food, especially the kind from fast-food restaurants. Limit your consumption to less than once per week.  Pastries and sweets: This includes most of the processed junk food and desserts you can think of. Ice cream, cookies, brownies, snack cakes, donuts, candy and more. Try to limit these to no more than four times a week.  Exercise is one of the best medicines for promoting health and maintaining cognitive fitness  at all stages in life. Exercise probably has the largest documented effect on brain health and performance of any lifestyle intervention. Studies have shown that even previously sedentary individuals who start exercising as late as age 57 show a significant survival benefit as compared to their non-exercising peers. In the [place], the current guidelines are for 30 minutes of moderate exercise per day, but increasing your activity level less than that may also be helpful. You do not have to get your 30 minutes of exercise in one shot and exercising for short periods of time spread throughout the day can be helpful. Go for several walks, learn to dance, or do something else you enjoy that gets your body moving. Of course, if you have an underlying medical condition or there is  any question about whether it is safe for you to exercise, you should consult a medical treatment provider prior to beginning exercise.

## 2020-05-10 NOTE — Progress Notes (Signed)
   [facility]  Feedback Note: I met with Candace Bell to review the findings resulting from Candace Bell neuropsychological evaluation. Since the last appointment, Candace Bell has been about the same. Time was spent reviewing the impressions and recommendations that are detailed in the evaluation report. We discussed impression of MCI, although in Candace Bell case I am not entirely convinced that Candace Bell needs to worry about neurodegeneration. We discussed ways to minimize risk such as dietary and lifestyle changes. Counseled Candace Bell about the importance of keeping Dr. Carollee Herter up to date on Candace Bell medication compliance (Candace Bell is good with BP meds but never started mirtazapine). I took time to explain the findings and answer all the patient's questions. I encouraged Candace Bell to contact me should Candace Bell have any further questions or if further follow up is desired.   Current Medications and Medical History   Current Outpatient Medications  Medication Sig Dispense Refill  . amLODipine (NORVASC) 5 MG tablet Take 1 tablet (5 mg total) by mouth daily. 30 tablet 2  . Apoaequorin (PREVAGEN PO) Take 1 tablet by mouth daily. (Patient not taking: Reported on 04/11/2020)    . meloxicam (MOBIC) 15 MG tablet Take 1 tablet (15 mg total) by mouth daily. 90 tablet 1  . metoprolol tartrate (LOPRESSOR) 100 MG tablet Take 1 tablet (100 mg total) by mouth once for 1 dose. Take two hours prior to your cardiac CT 1 tablet 0  . mirtazapine (REMERON) 15 MG tablet TAKE 1 TABLET BY MOUTH EVERYDAY AT BEDTIME 90 tablet 2   No current facility-administered medications for this visit.    Patient Active Problem List   Diagnosis Date Noted  . Hypertension   . Arthritis   . Other fatigue 03/07/2020  . Loss of appetite 03/07/2020  . SOB (shortness of breath) 03/07/2020  . Depression, major, single episode, mild ([place]) 03/07/2020  . Osteoarthritis of knee 03/07/2020  . Left hand pain 10/21/2018  . Hyperlipidemia LDL  goal <100 01/26/2018  . Memory loss 04/16/2017  . Osteopenia 10/13/2015  . Back pain 11/25/2013  . Essential hypertension 02/29/2012    Mental Status and Behavioral Observations  Candace Bell presented on time to the present encounter and was alert and generally oriented. Speech was normal in rate, rhythm, volume, and prosody. Self-reported mood was "good" and affect was euthymic. Thought process was logical and goal oriented and thought content was appropriate to the topics discussed. There were no safety concerns identified at today's encounter, such as thoughts of harming self or others.   Plan  Feedback provided regarding the patient's neuropsychological evaluation. Candace Bell presented as motivated regarding exercise, Candace Bell used to be good about that and it made Candace Bell feel a lot better. Candace Bell also has a diet high in non-nutritive, sweet foods and would like to improve that. Candace Bell presented as happy that Candace Bell does not have dementia although Candace Bell realizes Candace Bell may be at increased risk of developing it. Candace Bell was encouraged to contact me if any questions arise or if further follow up is desired.   Viviano Simas Nicole Kindred, PsyD, ABN Clinical Neuropsychologist  Service(s) Provided at This Encounter: 36 minutes [phone]; Conjoint therapy with patient present)

## 2020-06-13 ENCOUNTER — Other Ambulatory Visit: Payer: Self-pay | Admitting: Family Medicine

## 2020-07-18 ENCOUNTER — Ambulatory Visit: Payer: Medicare Other | Admitting: Family Medicine

## 2020-08-04 DIAGNOSIS — M25561 Pain in right knee: Secondary | ICD-10-CM | POA: Diagnosis not present

## 2020-08-04 DIAGNOSIS — M25562 Pain in left knee: Secondary | ICD-10-CM | POA: Diagnosis not present

## 2020-08-24 ENCOUNTER — Telehealth: Payer: Self-pay

## 2020-08-24 DIAGNOSIS — Z006 Encounter for examination for normal comparison and control in clinical research program: Secondary | ICD-10-CM

## 2020-08-24 NOTE — Telephone Encounter (Signed)
I called patient for her 90-day Identify Study follow up phone call. Patient is doing well with no cardiac symptoms at this time. I reminded patient I would call her in January for her 1 year follow-up. 

## 2020-10-07 ENCOUNTER — Telehealth: Payer: Self-pay | Admitting: *Deleted

## 2020-10-07 NOTE — Telephone Encounter (Signed)
Caller Name Erath Phone Number [phone] Patient Name Candace Bell Patient DOB 21-Jan-1951 Call Type Message Only Information Provided Reason for Call Request for General Office Information Initial Comment Caller states she need help with mychart Additional Comment need help with resetting mychart Disp. Time Disposition Final User 10/06/2020 5:21:29 PM General Information Provided Yes Spell, [name]

## 2020-10-07 NOTE — Telephone Encounter (Signed)
Called to try and help pt but she was on the phone handling a billing issue.  She will call back.

## 2020-10-11 NOTE — Telephone Encounter (Signed)
Spoke patient and she stated that she did not need help at this time.

## 2020-11-08 ENCOUNTER — Encounter: Payer: Self-pay | Admitting: Gastroenterology

## 2020-11-18 ENCOUNTER — Other Ambulatory Visit: Payer: Self-pay

## 2020-11-18 ENCOUNTER — Encounter: Payer: Self-pay | Admitting: Family Medicine

## 2020-11-18 ENCOUNTER — Ambulatory Visit (INDEPENDENT_AMBULATORY_CARE_PROVIDER_SITE_OTHER): Payer: Medicare Other | Admitting: Family Medicine

## 2020-11-18 ENCOUNTER — Ambulatory Visit: Payer: Medicare Other | Attending: Internal Medicine

## 2020-11-18 VITALS — BP 144/100 | HR 92 | Temp 97.6°F | Resp 18 | Ht 66.0 in | Wt 167.2 lb

## 2020-11-18 DIAGNOSIS — I10 Essential (primary) hypertension: Secondary | ICD-10-CM | POA: Diagnosis not present

## 2020-11-18 DIAGNOSIS — Z23 Encounter for immunization: Secondary | ICD-10-CM

## 2020-11-18 DIAGNOSIS — R29898 Other symptoms and signs involving the musculoskeletal system: Secondary | ICD-10-CM | POA: Diagnosis not present

## 2020-11-18 LAB — CBC WITH DIFFERENTIAL/PLATELET
Basophils Absolute: 0 10*3/uL (ref 0.0–0.1)
Basophils Relative: 1.1 % (ref 0.0–3.0)
Eosinophils Absolute: 0.2 10*3/uL (ref 0.0–0.7)
Eosinophils Relative: 4.4 % (ref 0.0–5.0)
HCT: 37.1 % (ref 36.0–46.0)
Hemoglobin: 12.2 g/dL (ref 12.0–15.0)
Lymphocytes Relative: 41 % (ref 12.0–46.0)
Lymphs Abs: 1.8 10*3/uL (ref 0.7–4.0)
MCHC: 32.9 g/dL (ref 30.0–36.0)
MCV: 86.2 fl (ref 78.0–100.0)
Monocytes Absolute: 0.5 10*3/uL (ref 0.1–1.0)
Monocytes Relative: 11.7 % (ref 3.0–12.0)
Neutro Abs: 1.9 10*3/uL (ref 1.4–7.7)
Neutrophils Relative %: 41.8 % — ABNORMAL LOW (ref 43.0–77.0)
Platelets: 206 10*3/uL (ref 150.0–400.0)
RBC: 4.31 Mil/uL (ref 3.87–5.11)
RDW: 13.9 % (ref 11.5–15.5)
WBC: 4.4 10*3/uL (ref 4.0–10.5)

## 2020-11-18 LAB — LIPID PANEL
Cholesterol: 151 mg/dL (ref 0–200)
HDL: 58.1 mg/dL (ref >39.00)
LDL Cholesterol: 82 mg/dL (ref 0–99)
NonHDL: 93.02
Total CHOL/HDL Ratio: 3
Triglycerides: 57 mg/dL (ref 0.0–149.0)
VLDL: 11.4 mg/dL (ref 0.0–40.0)

## 2020-11-18 LAB — COMPREHENSIVE METABOLIC PANEL
ALT: 13 U/L (ref 0–35)
AST: 16 U/L (ref 0–37)
Albumin: 4.1 g/dL (ref 3.5–5.2)
Alkaline Phosphatase: 72 U/L (ref 39–117)
BUN: 16 mg/dL (ref 6–23)
CO2: 26 mEq/L (ref 19–32)
Calcium: 9.5 mg/dL (ref 8.4–10.5)
Chloride: 106 mEq/L (ref 96–112)
Creatinine, Ser: 0.71 mg/dL (ref 0.40–1.20)
GFR: 84.9 mL/min (ref >60.00)
Glucose, Bld: 90 mg/dL (ref 70–99)
Potassium: 4 mEq/L (ref 3.5–5.1)
Sodium: 140 mEq/L (ref 135–145)
Total Bilirubin: 0.9 mg/dL (ref 0.2–1.2)
Total Protein: 7.1 g/dL (ref 6.0–8.3)

## 2020-11-18 LAB — TSH: TSH: 1.76 u[IU]/mL (ref 0.35–5.50)

## 2020-11-18 LAB — VITAMIN D 25 HYDROXY (VIT D DEFICIENCY, FRACTURES): VITD: 85.13 ng/mL (ref 30.00–100.00)

## 2020-11-18 MED ORDER — VALSARTAN-HYDROCHLOROTHIAZIDE 160-25 MG PO TABS: 1.0000 | ORAL_TABLET | Freq: Every day | ORAL | 1 refills | Status: DC

## 2020-11-18 MED ORDER — AMLODIPINE BESYLATE 5 MG PO TABS: 5.0000 mg | ORAL_TABLET | Freq: Every day | ORAL | 1 refills | Status: DC
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
## 2020-11-18 NOTE — Assessment & Plan Note (Signed)
Poorly controlled will alter medications, encouraged DASH diet, minimize caffeine and obtain adequate sleep. Report concerning symptoms and follow up as directed and as needed D/c norvasc due to edema-- pt would like to go back on diovan/hct since it worked so well F/u 2-3 weeks or sooner prn

## 2020-11-18 NOTE — Assessment & Plan Note (Signed)
Refer to PT and neuro ? Etiology  Check labs

## 2020-11-18 NOTE — Patient Instructions (Signed)
https://www.nhlbi.nih.gov/files/docs/public/heart/dash_brief.pdf">  DASH Eating Plan DASH stands for Dietary Approaches to Stop Hypertension. The DASH eating plan is a healthy eating plan that has been shown to: Reduce high blood pressure (hypertension). Reduce your risk for type 2 diabetes, heart disease, and stroke. Help with weight loss. What are tips for following this plan? Reading food labels Check food labels for the amount of salt (sodium) per serving. Choose foods with less than 5 percent of the Daily Value of sodium. Generally, foods with less than 300 milligrams (mg) of sodium per serving fit into this eating plan. To find whole grains, look for the word "whole" as the first word in the ingredient list. Shopping Buy products labeled as "low-sodium" or "no salt added." Buy fresh foods. Avoid canned foods and pre-made or frozen meals. Cooking Avoid adding salt when cooking. Use salt-free seasonings or herbs instead of table salt or sea salt. Check with your health care provider or pharmacist before using salt substitutes. Do not fry foods. Cook foods using healthy methods such as baking, boiling, grilling, roasting, and broiling instead. Cook with heart-healthy oils, such as olive, canola, avocado, soybean, or sunflower oil. Meal planning  Eat a balanced diet that includes: 4 or more servings of fruits and 4 or more servings of vegetables each day. Try to fill one-half of your plate with fruits and vegetables. 6-8 servings of whole grains each day. Less than 6 oz (170 g) of lean meat, poultry, or fish each day. A 3-oz (85-g) serving of meat is about the same size as a deck of cards. One egg equals 1 oz (28 g). 2-3 servings of low-fat dairy each day. One serving is 1 cup (237 mL). 1 serving of nuts, seeds, or beans 5 times each week. 2-3 servings of heart-healthy fats. Healthy fats called omega-3 fatty acids are found in foods such as walnuts, flaxseeds, fortified milks, and eggs.  These fats are also found in cold-water fish, such as sardines, salmon, and mackerel. Limit how much you eat of: Canned or prepackaged foods. Food that is high in trans fat, such as some fried foods. Food that is high in saturated fat, such as fatty meat. Desserts and other sweets, sugary drinks, and other foods with added sugar. Full-fat dairy products. Do not salt foods before eating. Do not eat more than 4 egg yolks a week. Try to eat at least 2 vegetarian meals a week. Eat more home-cooked food and less restaurant, buffet, and fast food.  Lifestyle When eating at a restaurant, ask that your food be prepared with less salt or no salt, if possible. If you drink alcohol: Limit how much you use to: 0-1 drink a day for women who are not pregnant. 0-2 drinks a day for men. Be aware of how much alcohol is in your drink. In the [place] one drink equals one 12 oz bottle of beer (355 mL), one 5 oz glass of wine (148 mL), or one 1 oz glass of hard liquor (44 mL). General information Avoid eating more than 2,300 mg of salt a day. If you have hypertension, you may need to reduce your sodium intake to 1,500 mg a day. Work with your health care provider to maintain a healthy body weight or to lose weight. Ask what an ideal weight is for you. Get at least 30 minutes of exercise that causes your heart to beat faster (aerobic exercise) most days of the week. Activities may include walking, swimming, or biking. Work with your health care provider   or dietitian to adjust your eating plan to your individual calorie needs. What foods should I eat? Fruits All fresh, dried, or frozen fruit. Canned fruit in natural juice (without addedsugar). Vegetables Fresh or frozen vegetables (raw, steamed, roasted, or grilled). Low-sodium or reduced-sodium tomato and vegetable juice. Low-sodium or reduced-sodium tomatosauce and tomato paste. Low-sodium or reduced-sodium canned vegetables. Grains Whole-grain or  whole-wheat bread. Whole-grain or whole-wheat pasta. Brown rice. Oatmeal. Quinoa. Bulgur. Whole-grain and low-sodium cereals. Pita bread.Low-fat, low-sodium crackers. Whole-wheat flour tortillas. Meats and other proteins Skinless chicken or turkey. Ground chicken or turkey. Pork with fat trimmed off. Fish and seafood. Egg whites. Dried beans, peas, or lentils. Unsalted nuts, nut butters, and seeds. Unsalted canned beans. Lean cuts of beef with fat trimmed off. Low-sodium, lean precooked or cured meat, such as sausages or meatloaves. Dairy Low-fat (1%) or fat-free (skim) milk. Reduced-fat, low-fat, or fat-free cheeses. Nonfat, low-sodium ricotta or cottage cheese. Low-fat or nonfatyogurt. Low-fat, low-sodium cheese. Fats and oils Soft margarine without trans fats. Vegetable oil. Reduced-fat, low-fat, or light mayonnaise and salad dressings (reduced-sodium). Canola, safflower, olive, avocado, soybean, andsunflower oils. Avocado. Seasonings and condiments Herbs. Spices. Seasoning mixes without salt. Other foods Unsalted popcorn and pretzels. Fat-free sweets. The items listed above may not be a complete list of foods and beverages you can eat. Contact a dietitian for more information. What foods should I avoid? Fruits Canned fruit in a light or heavy syrup. Fried fruit. Fruit in cream or buttersauce. Vegetables Creamed or fried vegetables. Vegetables in a cheese sauce. Regular canned vegetables (not low-sodium or reduced-sodium). Regular canned tomato sauce and paste (not low-sodium or reduced-sodium). Regular tomato and vegetable juice(not low-sodium or reduced-sodium). Pickles. Olives. Grains Baked goods made with fat, such as croissants, muffins, or some breads. Drypasta or rice meal packs. Meats and other proteins Fatty cuts of meat. Ribs. Fried meat. Bacon. Bologna, salami, and other precooked or cured meats, such as sausages or meat loaves. Fat from the back of a pig (fatback). Bratwurst.  Salted nuts and seeds. Canned beans with added salt. Canned orsmoked fish. Whole eggs or egg yolks. Chicken or turkey with skin. Dairy Whole or 2% milk, cream, and half-and-half. Whole or full-fat cream cheese. Whole-fat or sweetened yogurt. Full-fat cheese. Nondairy creamers. Whippedtoppings. Processed cheese and cheese spreads. Fats and oils Butter. Stick margarine. Lard. Shortening. Ghee. Bacon fat. Tropical oils, suchas coconut, palm kernel, or palm oil. Seasonings and condiments Onion salt, garlic salt, seasoned salt, table salt, and sea salt. Worcestershire sauce. Tartar sauce. Barbecue sauce. Teriyaki sauce. Soy sauce, including reduced-sodium. Steak sauce. Canned and packaged gravies. Fish sauce. Oyster sauce. Cocktail sauce. Store-bought horseradish. Ketchup. Mustard. Meat flavorings and tenderizers. Bouillon cubes. Hot sauces. Pre-made or packaged marinades. Pre-made or packaged taco seasonings. Relishes. Regular saladdressings. Other foods Salted popcorn and pretzels. The items listed above may not be a complete list of foods and beverages you should avoid. Contact a dietitian for more information. Where to find more information National Heart, Lung, and Blood Institute: www.nhlbi.nih.gov American Heart Association: www.heart.org Academy of Nutrition and Dietetics: www.eatright.org National Kidney Foundation: www.kidney.org Summary The DASH eating plan is a healthy eating plan that has been shown to reduce high blood pressure (hypertension). It may also reduce your risk for type 2 diabetes, heart disease, and stroke. When on the DASH eating plan, aim to eat more fresh fruits and vegetables, whole grains, lean proteins, low-fat dairy, and heart-healthy fats. With the DASH eating plan, you should limit salt (sodium) intake to 2,300   mg a day. If you have hypertension, you may need to reduce your sodium intake to 1,500 mg a day. Work with your health care provider or dietitian to adjust  your eating plan to your individual calorie needs. This information is not intended to replace advice given to you by your health care provider. Make sure you discuss any questions you have with your healthcare provider. Document Revised: 02/27/2019 Document Reviewed: 02/27/2019 Elsevier Patient Education  2022 Elsevier Inc.  

## 2020-11-18 NOTE — Progress Notes (Signed)
   Covid-19 Vaccination Clinic  Name:  Candace Bell    MRN: [id number] DOB: 15-Jan-1951  11/18/2020  [name] was observed post Covid-19 immunization for 15 minutes without incident. She was provided with Vaccine Information Sheet and instruction to access the V-Safe system.   [name] was instructed to call 911 with any severe reactions post vaccine: Difficulty breathing  Swelling of face and throat  A fast heartbeat  A bad rash all over body  Dizziness and weakness   Immunizations Administered     Name Date Dose VIS Date Route   PFIZER Comrnaty(Gray TOP) Covid-19 Vaccine 11/18/2020  9:37 AM 0.3 mL 03/17/2020 Intramuscular   Manufacturer: [place]   Lot: [id number]   NDC: [phone]

## 2020-11-28 ENCOUNTER — Other Ambulatory Visit (HOSPITAL_BASED_OUTPATIENT_CLINIC_OR_DEPARTMENT_OTHER): Payer: Self-pay

## 2020-11-28 MED ORDER — COVID-19 MRNA VAC-TRIS(PFIZER) 30 MCG/0.3ML IM SUSP
INTRAMUSCULAR | 0 refills | Status: DC
  Filled 2020-11-28: qty 0.3, 1d supply, fill #0

## 2020-12-14 ENCOUNTER — Ambulatory Visit (INDEPENDENT_AMBULATORY_CARE_PROVIDER_SITE_OTHER): Payer: Medicare Other

## 2020-12-14 VITALS — Ht 66.0 in | Wt 167.0 lb

## 2020-12-14 DIAGNOSIS — Z0000 Encounter for general adult medical examination without abnormal findings: Secondary | ICD-10-CM | POA: Diagnosis not present

## 2020-12-14 NOTE — Patient Instructions (Signed)
Candace Bell Thank you for taking time to complete your Medicare Wellness Visit. I appreciate your ongoing commitment to your health goals. Please review the following plan we discussed and let me know if I can assist you in the future.   Screening recommendations/referrals: Colonoscopy: Scheduled for 01/18/2021 Mammogram: Completed 01/18/2020-Due 01/17/2021 Bone Density: Completed 01/26/2020-Dye 01/25/2022 Recommended yearly ophthalmology/optometry visit for glaucoma screening and checkup Recommended yearly dental visit for hygiene and checkup  Vaccinations: Influenza vaccine: Due-May obtain vaccine at our office or your local pharmacy. Pneumococcal vaccine: Due-May obtain vaccine at our office or your local pharmacy. Tdap vaccine: Discuss with pharmacy Shingles vaccine: Discuss with pharmacy   Covid-19:Up to date  Advanced directives: Please bring a copy for your chart  Conditions/risks identified: See problem list  Next appointment: Follow up in one year for your annual wellness visit 12/18/2021 @ 2:20   Preventive Care 65 Years and Older, Female Preventive care refers to lifestyle choices and visits with your health care provider that can promote health and wellness. What does preventive care include? A yearly physical exam. This is also called an annual well check. Dental exams once or twice a year. Routine eye exams. Ask your health care provider how often you should have your eyes checked. Personal lifestyle choices, including: Daily care of your teeth and gums. Regular physical activity. Eating a healthy diet. Avoiding tobacco and drug use. Limiting alcohol use. Practicing safe sex. Taking low-dose aspirin every day. Taking vitamin and mineral supplements as recommended by your health care provider. What happens during an annual well check? The services and screenings done by your health care provider during your annual well check will depend on your age, overall health,  lifestyle risk factors, and family history of disease. Counseling  Your health care provider may ask you questions about your: Alcohol use. Tobacco use. Drug use. Emotional well-being. Home and relationship well-being. Sexual activity. Eating habits. History of falls. Memory and ability to understand (cognition). Work and work Statistician. Reproductive health. Screening  You may have the following tests or measurements: Height, weight, and BMI. Blood pressure. Lipid and cholesterol levels. These may be checked every 5 years, or more frequently if you are over 84 years old. Skin check. Lung cancer screening. You may have this screening every year starting at age 73 if you have a 30-pack-year history of smoking and currently smoke or have quit within the past 15 years. Fecal occult blood test (FOBT) of the stool. You may have this test every year starting at age 26. Flexible sigmoidoscopy or colonoscopy. You may have a sigmoidoscopy every 5 years or a colonoscopy every 10 years starting at age 33. Hepatitis C blood test. Hepatitis B blood test. Sexually transmitted disease (STD) testing. Diabetes screening. This is done by checking your blood sugar (glucose) after you have not eaten for a while (fasting). You may have this done every 1-3 years. Bone density scan. This is done to screen for osteoporosis. You may have this done starting at age 80. Mammogram. This may be done every 1-2 years. Talk to your health care provider about how often you should have regular mammograms. Talk with your health care provider about your test results, treatment options, and if necessary, the need for more tests. Vaccines  Your health care provider may recommend certain vaccines, such as: Influenza vaccine. This is recommended every year. Tetanus, diphtheria, and acellular pertussis (Tdap, Td) vaccine. You may need a Td booster every 10 years. Zoster vaccine. You may need this  after age  1. Pneumococcal 13-valent conjugate (PCV13) vaccine. One dose is recommended after age 33. Pneumococcal polysaccharide (PPSV23) vaccine. One dose is recommended after age 69. Talk to your health care provider about which screenings and vaccines you need and how often you need them. This information is not intended to replace advice given to you by your health care provider. Make sure you discuss any questions you have with your health care provider. Document Released: 04/22/2015 Document Revised: 12/14/2015 Document Reviewed: 01/25/2015 Elsevier Interactive Patient Education  2017 [place] Prevention in the Home Falls can cause injuries. They can happen to people of all ages. There are many things you can do to make your home safe and to help prevent falls. What can I do on the outside of my home? Regularly fix the edges of walkways and driveways and fix any cracks. Remove anything that might make you trip as you walk through a door, such as a raised step or threshold. Trim any bushes or trees on the path to your home. Use bright outdoor lighting. Clear any walking paths of anything that might make someone trip, such as rocks or tools. Regularly check to see if handrails are loose or broken. Make sure that both sides of any steps have handrails. Any raised decks and porches should have guardrails on the edges. Have any leaves, snow, or ice cleared regularly. Use sand or salt on walking paths during winter. Clean up any spills in your garage right away. This includes oil or grease spills. What can I do in the bathroom? Use night lights. Install grab bars by the toilet and in the tub and shower. Do not use towel bars as grab bars. Use non-skid mats or decals in the tub or shower. If you need to sit down in the shower, use a plastic, non-slip stool. Keep the floor dry. Clean up any water that spills on the floor as soon as it happens. Remove soap buildup in the tub or shower  regularly. Attach bath mats securely with double-sided non-slip rug tape. Do not have throw rugs and other things on the floor that can make you trip. What can I do in the bedroom? Use night lights. Make sure that you have a light by your bed that is easy to reach. Do not use any sheets or blankets that are too big for your bed. They should not hang down onto the floor. Have a firm chair that has side arms. You can use this for support while you get dressed. Do not have throw rugs and other things on the floor that can make you trip. What can I do in the kitchen? Clean up any spills right away. Avoid walking on wet floors. Keep items that you use a lot in easy-to-reach places. If you need to reach something above you, use a strong step stool that has a grab bar. Keep electrical cords out of the way. Do not use floor polish or wax that makes floors slippery. If you must use wax, use non-skid floor wax. Do not have throw rugs and other things on the floor that can make you trip. What can I do with my stairs? Do not leave any items on the stairs. Make sure that there are handrails on both sides of the stairs and use them. Fix handrails that are broken or loose. Make sure that handrails are as long as the stairways. Check any carpeting to make sure that it is firmly attached to the  stairs. Fix any carpet that is loose or worn. Avoid having throw rugs at the top or bottom of the stairs. If you do have throw rugs, attach them to the floor with carpet tape. Make sure that you have a light switch at the top of the stairs and the bottom of the stairs. If you do not have them, ask someone to add them for you. What else can I do to help prevent falls? Wear shoes that: Do not have high heels. Have rubber bottoms. Are comfortable and fit you well. Are closed at the toe. Do not wear sandals. If you use a stepladder: Make sure that it is fully opened. Do not climb a closed stepladder. Make sure that  both sides of the stepladder are locked into place. Ask someone to hold it for you, if possible. Clearly mark and make sure that you can see: Any grab bars or handrails. First and last steps. Where the edge of each step is. Use tools that help you move around (mobility aids) if they are needed. These include: Canes. Walkers. Scooters. Crutches. Turn on the lights when you go into a dark area. Replace any light bulbs as soon as they burn out. Set up your furniture so you have a clear path. Avoid moving your furniture around. If any of your floors are uneven, fix them. If there are any pets around you, be aware of where they are. Review your medicines with your doctor. Some medicines can make you feel dizzy. This can increase your chance of falling. Ask your doctor what other things that you can do to help prevent falls. This information is not intended to replace advice given to you by your health care provider. Make sure you discuss any questions you have with your health care provider. Document Released: 01/20/2009 Document Revised: 09/01/2015 Document Reviewed: 04/30/2014 Elsevier Interactive Patient Education  2017 [employer].
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
## 2021-01-11 ENCOUNTER — Other Ambulatory Visit: Payer: Self-pay

## 2021-01-11 ENCOUNTER — Ambulatory Visit (AMBULATORY_SURGERY_CENTER): Payer: Medicare Other

## 2021-01-11 ENCOUNTER — Encounter: Payer: Self-pay | Admitting: Gastroenterology

## 2021-01-11 VITALS — Ht 66.0 in | Wt 166.0 lb

## 2021-01-11 DIAGNOSIS — Z86010 Personal history of colonic polyps: Secondary | ICD-10-CM

## 2021-01-11 MED ORDER — PEG 3350-KCL-NA BICARB-NACL 420 G PO SOLR: 4000.0000 mL | Freq: Once | ORAL | 0 refills | Status: AC

## 2021-01-11 NOTE — Progress Notes (Signed)
No egg or soy allergy known to patient  No issues known to pt with past sedation with any surgeries or procedures Patient denies ever being told they had issues or difficulty with intubation  No FH of Malignant Hyperthermia Pt is not on diet pills Pt is not on home 02  Pt is not on blood thinners  Pt denies issues with constipation at this time;  No A fib or A flutter Pt is fully vaccinated for Covid x 2; NO PA's for preps discussed with pt in PV today  Discussed with pt there will be an out-of-pocket cost for prep and that varies from $0 to 70 +  dollars - pt verbalized understanding  Due to the COVID-19 pandemic we are asking patients to follow certain guidelines in PV and the [place]   Pt aware of COVID protocols and [facility] guidelines

## 2021-01-23 DIAGNOSIS — Z1231 Encounter for screening mammogram for malignant neoplasm of breast: Secondary | ICD-10-CM | POA: Diagnosis not present

## 2021-01-23 LAB — HM MAMMOGRAPHY

## 2021-01-25 ENCOUNTER — Other Ambulatory Visit: Payer: Self-pay

## 2021-01-25 ENCOUNTER — Encounter: Payer: Self-pay | Admitting: Gastroenterology

## 2021-01-25 ENCOUNTER — Ambulatory Visit (AMBULATORY_SURGERY_CENTER): Payer: Medicare Other | Admitting: Gastroenterology

## 2021-01-25 VITALS — BP 135/71 | HR 70 | Temp 96.2°F | Resp 17 | Ht 66.0 in | Wt 166.0 lb

## 2021-01-25 DIAGNOSIS — I10 Essential (primary) hypertension: Secondary | ICD-10-CM | POA: Diagnosis not present

## 2021-01-25 DIAGNOSIS — D122 Benign neoplasm of ascending colon: Secondary | ICD-10-CM

## 2021-01-25 DIAGNOSIS — D123 Benign neoplasm of transverse colon: Secondary | ICD-10-CM | POA: Diagnosis not present

## 2021-01-25 DIAGNOSIS — Z86010 Personal history of colonic polyps: Secondary | ICD-10-CM

## 2021-01-25 DIAGNOSIS — Z8371 Family history of colonic polyps: Secondary | ICD-10-CM

## 2021-01-25 MED ORDER — SODIUM CHLORIDE 0.9 % IV SOLN: 500.0000 mL | Freq: Once | INTRAVENOUS | Status: DC

## 2021-01-25 NOTE — Op Note (Signed)
[place] Patient Name: Candace Bell Procedure Date: 01/25/2021 10:05 AM MRN: [id number] Endoscopist: Ladene Artist , MD Age: 70 Referring MD:  Date of Birth: Sep 01, 1950 Gender: Female Account #: [id number] Procedure:                Colonoscopy Indications:              Surveillance: Personal history of adenomatous                            polyps on last colonoscopy > 5 years ago. Family                            history of colon polyps, 2 first degree relatives. Medicines:                Monitored Anesthesia Care Procedure:                Pre-Anesthesia Assessment:                           - Prior to the procedure, a History and Physical                            was performed, and patient medications and                            allergies were reviewed. The patient's tolerance of                            previous anesthesia was also reviewed. The risks                            and benefits of the procedure and the sedation                            options and risks were discussed with the patient.                            All questions were answered, and informed consent                            was obtained. Prior Anticoagulants: The patient has                            taken no previous anticoagulant or antiplatelet                            agents. ASA Grade Assessment: II - A patient with                            mild systemic disease. After reviewing the risks                            and benefits, the patient was deemed in  satisfactory condition to undergo the procedure.                           After obtaining informed consent, the colonoscope                            was passed under direct vision. Throughout the                            procedure, the patient's blood pressure, pulse, and                            oxygen saturations were monitored continuously. The                            CF [id number]  #[id number] was introduced through the anus                            and advanced to the the cecum, identified by                            appendiceal orifice and ileocecal valve. The                            ileocecal valve, appendiceal orifice, and rectum                            were photographed. The quality of the bowel                            preparation was good. The colonoscopy was performed                            without difficulty. The patient tolerated the                            procedure well. Scope In: 10:23:10 AM Scope Out: 10:36:52 AM Scope Withdrawal Time: 0 hours 11 minutes 3 seconds  Total Procedure Duration: 0 hours 13 minutes 42 seconds  Findings:                 The perianal and digital rectal examinations were                            normal.                           Three sessile polyps were found in the transverse                            colon (1) and ascending colon (2). The polyps were                            6 to 8 mm in size. These polyps were removed with a  cold snare. Resection and retrieval were complete.                           Internal hemorrhoids were found during                            retroflexion. The hemorrhoids were small and Grade                            I (internal hemorrhoids that do not prolapse).                           The exam was otherwise without abnormality on                            direct and retroflexion views. Complications:            No immediate complications. Estimated blood loss:                            None. Estimated Blood Loss:     Estimated blood loss: none. Impression:               - Three 6 to 8 mm polyps in the transverse colon                            and in the ascending colon, removed with a cold                            snare. Resected and retrieved.                           - Internal hemorrhoids.                           - The examination was  otherwise normal on direct                            and retroflexion views. Recommendation:           - Repeat colonoscopy after studies are complete for                            surveillance based on pathology results.                           - Patient has a contact number available for                            emergencies. The signs and symptoms of potential                            delayed complications were discussed with the                            patient. Return to normal activities tomorrow.  Written discharge instructions were provided to the                            patient.                           - Resume previous diet.                           - Continue present medications.                           - Await pathology results. Ladene Artist, MD 01/25/2021 10:41:18 AM This report has been signed electronically.

## 2021-01-25 NOTE — Patient Instructions (Signed)
Please read handouts provided. Continue present medications. Await pathology results.   YOU HAD AN ENDOSCOPIC PROCEDURE TODAY AT THE [facility]:   Refer to the procedure report that was given to you for any specific questions about what was found during the examination.  If the procedure report does not answer your questions, please call your gastroenterologist to clarify.  If you requested that your care partner not be given the details of your procedure findings, then the procedure report has been included in a sealed envelope for you to review at your convenience later.  YOU SHOULD EXPECT: Some feelings of bloating in the abdomen. Passage of more gas than usual.  Walking can help get rid of the air that was put into your GI tract during the procedure and reduce the bloating. If you had a lower endoscopy (such as a colonoscopy or flexible sigmoidoscopy) you may notice spotting of blood in your stool or on the toilet paper. If you underwent a bowel prep for your procedure, you may not have a normal bowel movement for a few days.  Please Note:  You might notice some irritation and congestion in your nose or some drainage.  This is from the oxygen used during your procedure.  There is no need for concern and it should clear up in a day or so.  SYMPTOMS TO REPORT IMMEDIATELY:  Following lower endoscopy (colonoscopy or flexible sigmoidoscopy):  Excessive amounts of blood in the stool  Significant tenderness or worsening of abdominal pains  Swelling of the abdomen that is new, acute  Fever of 100F or higher   For urgent or emergent issues, a gastroenterologist can be reached at any hour by calling [phone]. Do not use MyChart messaging for urgent concerns.    DIET:  We do recommend a small meal at first, but then you may proceed to your regular diet.  Drink plenty of fluids but you should avoid alcoholic beverages for 24 hours.  ACTIVITY:  You should plan to take it easy  for the rest of today and you should NOT DRIVE or use heavy machinery until tomorrow (because of the sedation medicines used during the test).    FOLLOW UP: Our staff will call the number listed on your records 48-72 hours following your procedure to check on you and address any questions or concerns that you may have regarding the information given to you following your procedure. If we do not reach you, we will leave a message.  We will attempt to reach you two times.  During this call, we will ask if you have developed any symptoms of COVID 19. If you develop any symptoms (ie: fever, flu-like symptoms, shortness of breath, cough etc.) before then, please call [phone].  If you test positive for Covid 19 in the 2 weeks post procedure, please call and report this information to us.    If any biopsies were taken you will be contacted by phone or by letter within the next 1-3 weeks.  Please call us at [phone] if you have not heard about the biopsies in 3 weeks.    SIGNATURES/CONFIDENTIALITY: You and/or your care partner have signed paperwork which will be entered into your electronic medical record.  These signatures attest to the fact that that the information above on your After Visit Summary has been reviewed and is understood.  Full responsibility of the confidentiality of this discharge information lies with you and/or your care-partner.  

## 2021-01-25 NOTE — Progress Notes (Signed)
Report to PACU, RN, vss, BBS= Clear.  

## 2021-01-25 NOTE — Progress Notes (Signed)
History & Physical  Primary Care Physician:  Carollee Herter, Alferd Apa, DO Primary Gastroenterologist: Lucio Edward, MD  CHIEF COMPLAINT:  Personal history of colon polyps, Family history of colon polyps   HPI: Candace Bell is a 70 y.o. female with a personal history of adenomatous colon polyps and a family history of colon polyps in both her mother and sister.  She presents today for colonoscopy.    Past Medical History:  Diagnosis Date   Arthritis    generalized   Cataract 2015   bilateral sx   GERD (gastroesophageal reflux disease)    with certain foods   Hypertension    on meds   Osteopenia 10/13/2015    Past Surgical History:  Procedure Laterality Date   ABDOMINAL HYSTERECTOMY  04/10/1991   TAH --took one ovary   BACK SURGERY  2002   BUNIONECTOMY WITH HAMMERTOE RECONSTRUCTION Right 2014   CATARACT EXTRACTION Bilateral 04/09/2013   COLONOSCOPY  2015   MS-MAC-movi(exc)-ulcerated polyp    Prior to Admission medications   Medication Sig Start Date End Date Taking? Authorizing Provider  Apoaequorin (PREVAGEN EXTRA STRENGTH PO) Take 1 tablet by mouth daily at 6 (six) AM.   Yes [provider]  naproxen sodium (ALEVE) 220 MG tablet Take 220 mg by mouth daily as needed.   Yes [provider]  valsartan-hydrochlorothiazide (DIOVAN-HCT) 160-25 MG tablet Take 1 tablet by mouth daily. 11/18/20  Yes [name], DO    Current Outpatient Medications  Medication Sig Dispense Refill   Apoaequorin (PREVAGEN EXTRA STRENGTH PO) Take 1 tablet by mouth daily at 6 (six) AM.     naproxen sodium (ALEVE) 220 MG tablet Take 220 mg by mouth daily as needed.     valsartan-hydrochlorothiazide (DIOVAN-HCT) 160-25 MG tablet Take 1 tablet by mouth daily. 90 tablet 1   Current Facility-Administered Medications  Medication Dose Route Frequency Provider Last Rate Last Admin   0.9 %  sodium chloride infusion  500 mL Intravenous Once Ladene Artist, MD         Allergies as of 01/25/2021 - Review Complete 01/25/2021  Allergen Reaction Noted   Amoxicillin Anaphylaxis and Swelling 01/10/2012   Other Anaphylaxis 01/12/2012   Penicillins Anaphylaxis and Swelling 01/10/2012    Family History  Problem Relation Age of Onset   Colon polyps Mother    Diabetes Mother    Dementia Father    Colon polyps Sister    Diabetes Brother    Stroke Brother    Esophageal cancer Neg Hx    Rectal cancer Neg Hx    Stomach cancer Neg Hx    Colon cancer Neg Hx     Social History   Socioeconomic History   Marital status: Divorced    Spouse name: Not on file   Number of children: Not on file   Years of education: Not on file   Highest education level: Not on file  Occupational History    Employer: [employer]    Comment: retired  Tobacco Use   Smoking status: Never   Smokeless tobacco: Never  Vaping Use   Vaping Use: Never used  Substance and Sexual Activity   Alcohol use: Yes    Alcohol/week: 0.0 standard drinks    Comment: Occasional wine monthly   Drug use: No   Sexual activity: Not Currently    Partners: Male  Other Topics Concern   Not on file  Social History Narrative   Exercise-- gym 3x a week  Social Determinants of Health   Financial Resource Strain: Low Risk    Difficulty of Paying Living Expenses: Not hard at all  Food Insecurity: No Food Insecurity   Worried About Charity fundraiser in the Last Year: Never true   [place] in the Last Year: Never true  Transportation Needs: No Transportation Needs   Lack of Transportation (Medical): No   Lack of Transportation (Non-Medical): No  Physical Activity: Insufficiently Active   Days of Exercise per Week: 7 days   Minutes of Exercise per Session: 10 min  Stress: No Stress Concern Present   Feeling of Stress : Not at all  Social Connections: Moderately Isolated   Frequency of Communication with Friends and Family: More than three times a week   Frequency of  Social Gatherings with Friends and Family: More than three times a week   Attends Religious Services: More than 4 times per year   Active Member of [employer] or Organizations: No   Attends Archivist Meetings: Never   Marital Status: Divorced  Human resources officer Violence: Not At Risk   Fear of Current or Ex-Partner: No   Emotionally Abused: No   Physically Abused: No   Sexually Abused: No    Review of Systems:  All systems reviewed an negative except where noted in HPI.  Gen: Denies any fever, chills, sweats, anorexia, fatigue, weakness, malaise, weight loss, and sleep disorder CV: Denies chest pain, angina, palpitations, syncope, orthopnea, PND, peripheral edema, and claudication. Resp: Denies dyspnea at rest, dyspnea with exercise, cough, sputum, wheezing, coughing up blood, and pleurisy. GI: Denies vomiting blood, jaundice, and fecal incontinence.   Denies dysphagia or odynophagia. GU : Denies urinary burning, blood in urine, urinary frequency, urinary hesitancy, nocturnal urination, and urinary incontinence. MS: Denies joint pain, limitation of movement, and swelling, stiffness, low back pain, extremity pain. Denies muscle weakness, cramps, atrophy.  Derm: Denies rash, itching, dry skin, hives, moles, warts, or unhealing ulcers.  Psych: Denies depression, anxiety, memory loss, suicidal ideation, hallucinations, paranoia, and confusion. Heme: Denies bruising, bleeding, and enlarged lymph nodes. Neuro:  Denies any headaches, dizziness, paresthesias. Endo:  Denies any problems with DM, thyroid, adrenal function.   Physical Exam: General:  Alert, well-developed, in NAD Head:  Normocephalic and atraumatic. Eyes:  Sclera clear, no icterus.   Conjunctiva pink. Ears:  Normal auditory acuity. Mouth:  No deformity or lesions.  Neck:  Supple; no masses . Lungs:  Clear throughout to auscultation.   No wheezes, crackles, or rhonchi. No acute distress. Heart:  Regular rate and rhythm;  no murmurs. Abdomen:  Soft, nondistended, nontender. No masses, hepatomegaly. No obvious masses.  Normal bowel .    Rectal:  Deferred   Msk:  Symmetrical without gross deformities.. Pulses:  Normal pulses noted. Extremities:  Without edema. Neurologic:  Alert and  oriented x4;  grossly normal neurologically. Skin:  Intact without significant lesions or rashes. Cervical Nodes:  No significant cervical adenopathy. Psych:  Alert and cooperative. Normal mood and affect.   Impression / Plan:   1.  Personal history of adenomatous colon polyps and family history of colon polyps in her mother and sister for colonoscopy.   This patient is appropriate for endoscopic procedures in the ambulatory setting.    Pricilla Riffle. Fuller Plan  01/25/2021, 10:18 AM See Shea Evans, [facility], to contact our on call provider

## 2021-01-25 NOTE — Progress Notes (Signed)
Pt's states no medical or surgical changes since previsit or office visit. 

## 2021-02-10 ENCOUNTER — Encounter: Payer: Self-pay | Admitting: Gastroenterology

## 2021-02-22 ENCOUNTER — Ambulatory Visit: Payer: Medicare Other | Attending: Internal Medicine

## 2021-02-22 ENCOUNTER — Other Ambulatory Visit (HOSPITAL_BASED_OUTPATIENT_CLINIC_OR_DEPARTMENT_OTHER): Payer: Self-pay

## 2021-02-22 DIAGNOSIS — Z23 Encounter for immunization: Secondary | ICD-10-CM

## 2021-02-22 MED ORDER — INFLUENZA VAC A&B SA ADJ QUAD 0.5 ML IM PRSY
PREFILLED_SYRINGE | INTRAMUSCULAR | 0 refills | Status: DC
  Filled 2021-02-22: qty 0.5, 1d supply, fill #0

## 2021-02-22 NOTE — Progress Notes (Signed)
   Covid-19 Vaccination Clinic  Name:  Candace Bell    MRN: [id number] DOB: 1950/04/14  02/22/2021  [name] was observed post Covid-19 immunization for 15 minutes without incident. She was provided with Vaccine Information Sheet and instruction to access the V-Safe system.   [name] was instructed to call 911 with any severe reactions post vaccine: Difficulty breathing  Swelling of face and throat  A fast heartbeat  A bad rash all over body  Dizziness and weakness   Immunizations Administered     Name Date Dose VIS Date Route   Pfizer Covid-19 Vaccine Bivalent Booster 02/22/2021 11:30 AM 0.3 mL 12/07/2020 Intramuscular   Manufacturer: [place]   Lot: [id number]   [place]: [phone]

## 2021-03-10 ENCOUNTER — Other Ambulatory Visit (HOSPITAL_BASED_OUTPATIENT_CLINIC_OR_DEPARTMENT_OTHER): Payer: Self-pay

## 2021-03-10 MED ORDER — PFIZER COVID-19 VAC BIVALENT 30 MCG/0.3ML IM SUSP
INTRAMUSCULAR | 0 refills | Status: DC
  Filled 2021-03-10: qty 0.3, 1d supply, fill #0

## 2021-05-30 ENCOUNTER — Encounter: Payer: Medicare Other | Admitting: Family Medicine

## 2021-06-02 ENCOUNTER — Other Ambulatory Visit: Payer: Self-pay | Admitting: Family Medicine

## 2021-06-02 DIAGNOSIS — I10 Essential (primary) hypertension: Secondary | ICD-10-CM

## 2021-07-03 ENCOUNTER — Ambulatory Visit (INDEPENDENT_AMBULATORY_CARE_PROVIDER_SITE_OTHER): Payer: Medicare Other | Admitting: Family Medicine

## 2021-07-03 ENCOUNTER — Encounter: Payer: Self-pay | Admitting: Family Medicine

## 2021-07-03 VITALS — BP 134/72 | HR 92 | Temp 98.1°F | Resp 16 | Ht 63.0 in | Wt 162.6 lb

## 2021-07-03 DIAGNOSIS — Z23 Encounter for immunization: Secondary | ICD-10-CM | POA: Diagnosis not present

## 2021-07-03 DIAGNOSIS — Z0000 Encounter for general adult medical examination without abnormal findings: Secondary | ICD-10-CM

## 2021-07-03 DIAGNOSIS — I10 Essential (primary) hypertension: Secondary | ICD-10-CM

## 2021-07-03 DIAGNOSIS — E785 Hyperlipidemia, unspecified: Secondary | ICD-10-CM | POA: Diagnosis not present

## 2021-07-03 NOTE — Assessment & Plan Note (Signed)
Encourage heart healthy diet such as MIND or DASH diet, increase exercise, avoid trans fats, simple carbohydrates and processed foods, consider a krill or fish or flaxseed oil cap daily.  °

## 2021-07-03 NOTE — Progress Notes (Signed)
? ?Subjective:  ? ?By signing my name below, I, Candace Bell, attest that this documentation has been prepared under the direction and in the presence of Candace Bell R DO 07/03/2021 ? ? Patient ID: Candace Bell, female    DOB: Feb 26, 1951, 71 y.o.   MRN: [id number] ? ?Chief Complaint  ?Patient presents with  ? Annual Exam  ?  Here for Annual Exam   ? ? ?HPI ?Patient is in today for a comprehensive physical exam.  ? ?Her right knee pain is still persistent. She is considering receiving surgery for her knee. She states that her symptoms worsen when it's colder outside. ? ?She denies having any fever, new muscle pain, new moles, congestion, sinus pain, sore throat, chest pain, palpations, cough, SOB ,wheezing,n/v/d constipation, blood in stool, dysuria, frequency, hematuria, depression, anxiety, headaches at this time ? ?She reports no new changes to family history.  ?Her last completed mammogram was completed in 01/23/2021 ?She reports that her dexa scan was last completed in 2022 ?Her last completed colonoscopy was on 01/25/2021 ?Her last completed Dexa scan was on 01/26/2020 ?She is interested in receiving the Pneumonia vaccine.  ?She is not UTD on vision exams. ? ?Past Medical History:  ?Diagnosis Date  ? Arthritis   ? generalized  ? Cataract 2015  ? bilateral sx  ? GERD (gastroesophageal reflux disease)   ? with certain foods  ? Hypertension   ? on meds  ? Osteopenia 10/13/2015  ? ? ?Past Surgical History:  ?Procedure Laterality Date  ? ABDOMINAL HYSTERECTOMY  04/10/1991  ? TAH --took one ovary  ? BACK SURGERY  2002  ? BUNIONECTOMY WITH HAMMERTOE RECONSTRUCTION Right 2014  ? CATARACT EXTRACTION Bilateral 04/09/2013  ? COLONOSCOPY  2015  ? MS-MAC-movi(exc)-ulcerated polyp  ? ? ?Family History  ?Problem Relation Age of Onset  ? Colon polyps Mother   ? Diabetes Mother   ? Dementia Father   ? Colon polyps Sister   ? Diabetes Brother   ? Stroke Brother   ? Esophageal cancer Neg Hx   ? Rectal cancer Neg Hx   ?  Stomach cancer Neg Hx   ? Colon cancer Neg Hx   ? ? ?Social History  ? ?Socioeconomic History  ? Marital status: Divorced  ?  Spouse name: Not on file  ? Number of children: Not on file  ? Years of education: Not on file  ? Highest education level: Not on file  ?Occupational History  ?  Employer: [employer]  ?  Comment: retired  ?Tobacco Use  ? Smoking status: Never  ? Smokeless tobacco: Never  ?Vaping Use  ? Vaping Use: Never used  ?Substance and Sexual Activity  ? Alcohol use: Yes  ?  Alcohol/week: 0.0 standard drinks  ?  Comment: Occasional wine monthly  ? Drug use: No  ? Sexual activity: Not Currently  ?  Partners: Male  ?Other Topics Concern  ? Not on file  ?Social History Narrative  ? Exercise-- gym 3x a week   ? ?Social Determinants of Health  ? ?Financial Resource Strain: Low Risk   ? Difficulty of Paying Living Expenses: Not hard at all  ?Food Insecurity: No Food Insecurity  ? Worried About Charity fundraiser in the Last Year: Never true  ? Ran Out of Food in the Last Year: Never true  ?Transportation Needs: No Transportation Needs  ? Lack of Transportation (Medical): No  ? Lack of Transportation (Non-Medical): No  ?Physical Activity: Insufficiently Active  ?  Days of Exercise per Week: 7 days  ? Minutes of Exercise per Session: 10 min  ?Stress: No Stress Concern Present  ? Feeling of Stress : Not at all  ?Social Connections: Moderately Isolated  ? Frequency of Communication with Friends and Family: More than three times a week  ? Frequency of Social Gatherings with Friends and Family: More than three times a week  ? Attends Religious Services: More than 4 times per year  ? Active Member of Clubs or Organizations: No  ? Attends Archivist Meetings: Never  ? Marital Status: Divorced  ?Intimate Partner Violence: Not At Risk  ? Fear of Current or Ex-Partner: No  ? Emotionally Abused: No  ? Physically Abused: No  ? Sexually Abused: No  ? ? ?Outpatient Medications Prior to Visit  ?Medication  Sig Dispense Refill  ? Apoaequorin (PREVAGEN EXTRA STRENGTH PO) Take 1 tablet by mouth daily at 6 (six) AM.    ? COVID-19 mRNA bivalent vaccine, Pfizer, (PFIZER COVID-19 VAC BIVALENT) injection Inject into the muscle. 0.3 mL 0  ? influenza vaccine adjuvanted (FLUAD) 0.5 ML injection Inject into the muscle. 0.5 mL 0  ? naproxen sodium (ALEVE) 220 MG tablet Take 220 mg by mouth daily as needed.    ? valsartan-hydrochlorothiazide (DIOVAN-HCT) 160-25 MG tablet TAKE 1 TABLET BY MOUTH EVERY DAY *DUE FOR APPOINTMENT* 90 tablet 0  ? ?No facility-administered medications prior to visit.  ? ? ?Allergies  ?Allergen Reactions  ? Amoxicillin Anaphylaxis and Swelling  ? Other Anaphylaxis  ?  All -cillins ? ?  ? Penicillins Anaphylaxis and Swelling  ? ? ?Review of Systems  ?Constitutional:  Negative for fever.  ?HENT:  Negative for congestion, sinus pain and sore throat.   ?Respiratory:  Negative for cough, shortness of breath and wheezing.   ?Cardiovascular:  Negative for chest pain and palpitations.  ?Gastrointestinal:  Negative for blood in stool, constipation, diarrhea, nausea and vomiting.  ?Genitourinary:  Negative for dysuria, frequency and hematuria.  ?Musculoskeletal:  Positive for joint pain (Right Knee). Negative for myalgias.  ?Skin:   ?     (-) [place]  ?Neurological:  Negative for headaches.  ?Psychiatric/Behavioral:  Negative for depression. The patient is not nervous/anxious.   ? ?   ?Objective:  ?  ?Physical Exam ?Constitutional:   ?   General: She is not in acute distress. ?   Appearance: Normal appearance. She is not ill-appearing.  ?HENT:  ?   Head: Normocephalic and atraumatic.  ?   Right Ear: Tympanic membrane, ear canal and external ear normal.  ?   Left Ear: Tympanic membrane, ear canal and external ear normal.  ?Eyes:  ?   Extraocular Movements: Extraocular movements intact.  ?   Pupils: Pupils are equal, round, and reactive to light.  ?Cardiovascular:  ?   Rate and Rhythm: Normal rate and regular  rhythm.  ?   Heart sounds: Normal heart sounds. No murmur heard. ?  No gallop.  ?Pulmonary:  ?   Effort: Pulmonary effort is normal. No respiratory distress.  ?   Breath sounds: Normal breath sounds. No wheezing or rales.  ?Abdominal:  ?   General: Bowel sounds are normal. There is no distension.  ?   Palpations: Abdomen is soft.  ?   Tenderness: There is no abdominal tenderness. There is no guarding.  ?Skin: ?   General: Skin is warm and dry.  ?Neurological:  ?   Mental Status: She is alert and oriented to person,  place, and time.  ?Psychiatric:     ?   Judgment: Judgment normal.  ? ? ?BP 134/72 (BP Location: Right Arm, Patient Position: Sitting, Cuff Size: Normal)   Pulse 92   Temp 98.1 ?F (36.7 ?C) (Oral)   Resp 16   Ht _0  (1.6 m)   Wt 162 lb 9.6 oz (73.8 kg)   SpO2 97%   BMI 28.80 kg/m?  ?Wt Readings from Last 3 Encounters:  ?07/03/21 162 lb 9.6 oz (73.8 kg)  ?01/25/21 166 lb (75.3 kg)  ?01/11/21 166 lb (75.3 kg)  ? ? ?Diabetic Foot Exam - Simple   ?No data filed ?  ? ?Lab Results  ?Component Value Date  ? WBC 4.4 11/18/2020  ? HGB 12.2 11/18/2020  ? HCT 37.1 11/18/2020  ? PLT 206.0 11/18/2020  ? GLUCOSE 90 11/18/2020  ? CHOL 151 11/18/2020  ? TRIG 57.0 11/18/2020  ? HDL 58.10 11/18/2020  ? Lloyd 82 11/18/2020  ? ALT 13 11/18/2020  ? AST 16 11/18/2020  ? NA 140 11/18/2020  ? K 4.0 11/18/2020  ? CL 106 11/18/2020  ? CREATININE 0.71 11/18/2020  ? BUN 16 11/18/2020  ? CO2 26 11/18/2020  ? TSH 1.76 11/18/2020  ? HGBA1C 6.0 04/30/2018  ? MICROALBUR <0.7 08/19/2014  ? ? ?Lab Results  ?Component Value Date  ? TSH 1.76 11/18/2020  ? ?Lab Results  ?Component Value Date  ? WBC 4.4 11/18/2020  ? HGB 12.2 11/18/2020  ? HCT 37.1 11/18/2020  ? MCV 86.2 11/18/2020  ? PLT 206.0 11/18/2020  ? ?Lab Results  ?Component Value Date  ? NA 140 11/18/2020  ? K 4.0 11/18/2020  ? CO2 26 11/18/2020  ? GLUCOSE 90 11/18/2020  ? BUN 16 11/18/2020  ? CREATININE 0.71 11/18/2020  ? BILITOT 0.9 11/18/2020  ? ALKPHOS 72 11/18/2020  ?  AST 16 11/18/2020  ? ALT 13 11/18/2020  ? PROT 7.1 11/18/2020  ? ALBUMIN 4.1 11/18/2020  ? CALCIUM 9.5 11/18/2020  ? GFR 84.90 11/18/2020  ? ?Lab Results  ?Component Value Date  ? CHOL 151 11/18/2020  ? ?Lab Result

## 2021-07-03 NOTE — Assessment & Plan Note (Signed)
ghm utd Check labs  See avs  

## 2021-07-03 NOTE — Assessment & Plan Note (Signed)
Well controlled, no changes to meds. Encouraged heart healthy diet such as the DASH diet and exercise as tolerated.  °

## 2021-07-03 NOTE — Patient Instructions (Signed)
Preventive Care 34 Years and Older, Female ?Preventive care refers to lifestyle choices and visits with your health care provider that can promote health and wellness. Preventive care visits are also called wellness exams. ?What can I expect for my preventive care visit? ?Counseling ?Your health care provider may ask you questions about your: ?Medical history, including: ?Past medical problems. ?Family medical history. ?Pregnancy and menstrual history. ?History of falls. ?Current health, including: ?Memory and ability to understand (cognition). ?Emotional well-being. ?Home life and relationship well-being. ?Sexual activity and sexual health. ?Lifestyle, including: ?Alcohol, nicotine or tobacco, and drug use. ?Access to firearms. ?Diet, exercise, and sleep habits. ?Work and work Statistician. ?Sunscreen use. ?Safety issues such as seatbelt and bike helmet use. ?Physical exam ?Your health care provider will check your: ?Height and weight. These may be used to calculate your BMI (body mass index). BMI is a measurement that tells if you are at a healthy weight. ?Waist circumference. This measures the distance around your waistline. This measurement also tells if you are at a healthy weight and may help predict your risk of certain diseases, such as type 2 diabetes and high blood pressure. ?Heart rate and blood pressure. ?Body temperature. ?Skin for abnormal spots. ?What immunizations do I need? ?Vaccines are usually given at various ages, according to a schedule. Your health care provider will recommend vaccines for you based on your age, medical history, and lifestyle or other factors, such as travel or where you work. ?What tests do I need? ?Screening ?Your health care provider may recommend screening tests for certain conditions. This may include: ?Lipid and cholesterol levels. ?Hepatitis C test. ?Hepatitis B test. ?HIV (human immunodeficiency virus) test. ?STI (sexually transmitted infection) testing, if you are at  risk. ?Lung cancer screening. ?Colorectal cancer screening. ?Diabetes screening. This is done by checking your blood sugar (glucose) after you have not eaten for a while (fasting). ?Mammogram. Talk with your health care provider about how often you should have regular mammograms. ?BRCA-related cancer screening. This may be done if you have a family history of breast, ovarian, tubal, or peritoneal cancers. ?Bone density scan. This is done to screen for osteoporosis. ?Talk with your health care provider about your test results, treatment options, and if necessary, the need for more tests. ?Follow these instructions at home: ?Eating and drinking ? ?Eat a diet that includes fresh fruits and vegetables, whole grains, lean protein, and low-fat dairy products. Limit your intake of foods with high amounts of sugar, saturated fats, and salt. ?Take vitamin and mineral supplements as recommended by your health care provider. ?Do not drink alcohol if your health care provider tells you not to drink. ?If you drink alcohol: ?Limit how much you have to 0-1 drink a day. ?Know how much alcohol is in your drink. In the [place] one drink equals one 12 oz bottle of beer (355 mL), one 5 oz glass of wine (148 mL), or one 1? oz glass of hard liquor (44 mL). ?Lifestyle ?Brush your teeth every morning and night with fluoride toothpaste. Floss one time each day. ?Exercise for at least 30 minutes 5 or more days each week. ?Do not use any products that contain nicotine or tobacco. These products include cigarettes, chewing tobacco, and vaping devices, such as e-cigarettes. If you need help quitting, ask your health care provider. ?Do not use drugs. ?If you are sexually active, practice safe sex. Use a condom or other form of protection in order to prevent STIs. ?Take aspirin only as told by your  health care provider. Make sure that you understand how much to take and what form to take. Work with your health care provider to find out whether it  is safe and beneficial for you to take aspirin daily. ?Ask your health care provider if you need to take a cholesterol-lowering medicine (statin). ?Find healthy ways to manage stress, such as: ?Meditation, yoga, or listening to music. ?Journaling. ?Talking to a trusted person. ?Spending time with friends and family. ?Minimize exposure to UV radiation to reduce your risk of skin cancer. ?Safety ?Always wear your seat belt while driving or riding in a vehicle. ?Do not drive: ?If you have been drinking alcohol. Do not ride with someone who has been drinking. ?When you are tired or distracted. ?While texting. ?If you have been using any mind-altering substances or drugs. ?Wear a helmet and other protective equipment during sports activities. ?If you have firearms in your house, make sure you follow all gun safety procedures. ?What's next? ?Visit your health care provider once a year for an annual wellness visit. ?Ask your health care provider how often you should have your eyes and teeth checked. ?Stay up to date on all vaccines. ?This information is not intended to replace advice given to you by your health care provider. Make sure you discuss any questions you have with your health care provider. ?Document Revised: 09/21/2020 Document Reviewed: 09/21/2020 ?Elsevier Patient Education ? [place]. ? ?

## 2021-07-04 LAB — LIPID PANEL
Cholesterol: 156 mg/dL (ref 0–200)
HDL: 54 mg/dL (ref >39.00)
LDL Cholesterol: 88 mg/dL (ref 0–99)
NonHDL: 101.54
Total CHOL/HDL Ratio: 3
Triglycerides: 67 mg/dL (ref 0.0–149.0)
VLDL: 13.4 mg/dL (ref 0.0–40.0)

## 2021-07-04 LAB — COMPREHENSIVE METABOLIC PANEL
ALT: 14 U/L (ref 0–35)
AST: 17 U/L (ref 0–37)
Albumin: 4.2 g/dL (ref 3.5–5.2)
Alkaline Phosphatase: 62 U/L (ref 39–117)
BUN: 21 mg/dL (ref 6–23)
CO2: 29 mEq/L (ref 19–32)
Calcium: 10.4 mg/dL (ref 8.4–10.5)
Chloride: 102 mEq/L (ref 96–112)
Creatinine, Ser: 0.82 mg/dL (ref 0.40–1.20)
GFR: 71.11 mL/min (ref >60.00)
Glucose, Bld: 93 mg/dL (ref 70–99)
Potassium: 3.9 mEq/L (ref 3.5–5.1)
Sodium: 138 mEq/L (ref 135–145)
Total Bilirubin: 0.8 mg/dL (ref 0.2–1.2)
Total Protein: 7.2 g/dL (ref 6.0–8.3)

## 2021-07-04 LAB — CBC WITH DIFFERENTIAL/PLATELET
Basophils Absolute: 0 10*3/uL (ref 0.0–0.1)
Basophils Relative: 0.8 % (ref 0.0–3.0)
Eosinophils Absolute: 0.2 10*3/uL (ref 0.0–0.7)
Eosinophils Relative: 3.5 % (ref 0.0–5.0)
HCT: 36.5 % (ref 36.0–46.0)
Hemoglobin: 12.1 g/dL (ref 12.0–15.0)
Lymphocytes Relative: 33.8 % (ref 12.0–46.0)
Lymphs Abs: 1.7 10*3/uL (ref 0.7–4.0)
MCHC: 33.2 g/dL (ref 30.0–36.0)
MCV: 87.4 fl (ref 78.0–100.0)
Monocytes Absolute: 0.4 10*3/uL (ref 0.1–1.0)
Monocytes Relative: 8.2 % (ref 3.0–12.0)
Neutro Abs: 2.7 10*3/uL (ref 1.4–7.7)
Neutrophils Relative %: 53.7 % (ref 43.0–77.0)
Platelets: 212 10*3/uL (ref 150.0–400.0)
RBC: 4.17 Mil/uL (ref 3.87–5.11)
RDW: 13.5 % (ref 11.5–15.5)
WBC: 5.1 10*3/uL (ref 4.0–10.5)

## 2021-09-04 ENCOUNTER — Other Ambulatory Visit: Payer: Self-pay | Admitting: Family Medicine

## 2021-09-04 DIAGNOSIS — I10 Essential (primary) hypertension: Secondary | ICD-10-CM

## 2021-09-19 ENCOUNTER — Ambulatory Visit: Payer: Medicare Other | Admitting: Family Medicine

## 2021-12-01 ENCOUNTER — Other Ambulatory Visit: Payer: Self-pay | Admitting: Family Medicine

## 2021-12-01 DIAGNOSIS — I10 Essential (primary) hypertension: Secondary | ICD-10-CM

## 2021-12-12 ENCOUNTER — Encounter: Payer: Self-pay | Admitting: Family Medicine

## 2021-12-12 ENCOUNTER — Ambulatory Visit (INDEPENDENT_AMBULATORY_CARE_PROVIDER_SITE_OTHER): Payer: Medicare Other | Admitting: Family Medicine

## 2021-12-12 VITALS — BP 138/70 | HR 80 | Temp 98.2°F | Ht 64.0 in | Wt 163.4 lb

## 2021-12-12 DIAGNOSIS — E785 Hyperlipidemia, unspecified: Secondary | ICD-10-CM | POA: Diagnosis not present

## 2021-12-12 DIAGNOSIS — I10 Essential (primary) hypertension: Secondary | ICD-10-CM | POA: Diagnosis not present

## 2021-12-12 DIAGNOSIS — R1013 Epigastric pain: Secondary | ICD-10-CM

## 2021-12-12 MED ORDER — PANTOPRAZOLE SODIUM 40 MG PO TBEC: 40.0000 mg | DELAYED_RELEASE_TABLET | Freq: Every day | ORAL | 3 refills | Status: DC

## 2021-12-12 MED ORDER — VALSARTAN-HYDROCHLOROTHIAZIDE 160-25 MG PO TABS: 1.0000 | ORAL_TABLET | Freq: Every day | ORAL | 1 refills | Status: DC

## 2021-12-12 NOTE — Progress Notes (Signed)
Established Patient Office Visit  Subjective   Patient ID: Candace Bell, female    DOB: [birth year]  Age: 71 y.o. MRN: [id number]  Chief Complaint  Patient presents with   Medical Management of Chronic Issues    6 m f/u  Has a hx of hernia and gets [facility] off and on. Infrequently     HPI Pt is here for 6 month f/u of bp / chol.  She also she also cont to   Review of Systems  Constitutional:  Negative for fever and malaise/fatigue.  HENT:  Negative for congestion.   Eyes:  Negative for blurred vision.  Respiratory:  Negative for cough and shortness of breath.   Cardiovascular:  Negative for chest pain, palpitations and leg swelling.  Gastrointestinal:  Positive for heartburn. Negative for nausea and vomiting.  Musculoskeletal:  Negative for back pain.  Skin:  Negative for rash.  Neurological:  Negative for loss of consciousness and headaches.      Objective:     BP 138/70   Pulse 80   Temp 98.2 F (36.8 C) (Oral)   Ht '5\' 4"'$  (1.626 m)   Wt 163 lb 6.4 oz (74.1 kg)   SpO2 96%   BMI 28.05 kg/m  BP Readings from Last 3 Encounters:  12/12/21 138/70  07/03/21 134/72  01/25/21 135/71   Wt Readings from Last 3 Encounters:  12/12/21 163 lb 6.4 oz (74.1 kg)  07/03/21 162 lb 9.6 oz (73.8 kg)  01/25/21 166 lb (75.3 kg)   SpO2 Readings from Last 3 Encounters:  12/12/21 96%  07/03/21 97%  01/25/21 99%      Physical Exam Vitals and nursing note reviewed.  Constitutional:      Appearance: She is well-developed.  HENT:     Head: Normocephalic and atraumatic.  Eyes:     Conjunctiva/sclera: Conjunctivae normal.  Neck:     Thyroid: No thyromegaly.     Vascular: No carotid bruit or JVD.  Cardiovascular:     Rate and Rhythm: Normal rate and regular rhythm.     Heart sounds: Normal heart sounds. No murmur heard. Pulmonary:     Effort: Pulmonary effort is normal. No respiratory distress.     Breath sounds: Normal breath sounds. No wheezing or rales.  Chest:      Chest wall: No tenderness.  Abdominal:     General: There is no distension.     Tenderness: There is no abdominal tenderness. There is no guarding or rebound.  Musculoskeletal:     Cervical back: Normal range of motion and neck supple.  Neurological:     Mental Status: She is alert and oriented to person, place, and time.      No results found for any visits on 12/12/21.  Last CBC Lab Results  Component Value Date   WBC 5.1 07/03/2021   HGB 12.1 07/03/2021   HCT 36.5 07/03/2021   MCV 87.4 07/03/2021   MCH 28.1 10/14/2015   RDW 13.5 07/03/2021   PLT 212.0 51/76/1607   Last metabolic panel Lab Results  Component Value Date   GLUCOSE 93 07/03/2021   NA 138 07/03/2021   K 3.9 07/03/2021   CL 102 07/03/2021   CO2 29 07/03/2021   BUN 21 07/03/2021   CREATININE 0.82 07/03/2021   GFRNONAA 83 03/21/2020   CALCIUM 10.4 07/03/2021   PROT 7.2 07/03/2021   ALBUMIN 4.2 07/03/2021   BILITOT 0.8 07/03/2021   ALKPHOS 62 07/03/2021   AST 17 07/03/2021  ALT 14 07/03/2021   Last lipids Lab Results  Component Value Date   CHOL 156 07/03/2021   HDL 54.00 07/03/2021   LDLCALC 88 07/03/2021   TRIG 67.0 07/03/2021   CHOLHDL 3 07/03/2021   Last hemoglobin A1c Lab Results  Component Value Date   HGBA1C 6.0 04/30/2018   Last thyroid functions Lab Results  Component Value Date   TSH 1.76 11/18/2020   Last vitamin D Lab Results  Component Value Date   VD25OH 85.13 11/18/2020   Last vitamin B12 and Folate Lab Results  Component Value Date   [id number] 496 03/07/2020      The 10-year ASCVD risk score (Arnett DK, et al., 2019) is: 12.5%    Assessment & Plan:   Problem List Items Addressed This Visit       Unprioritized   Hypertension - Primary    Well controlled, no changes to meds. Encouraged heart healthy diet such as the DASH diet and exercise as tolerated.       Relevant Medications   valsartan-hydrochlorothiazide (DIOVAN-HCT) 160-25 MG tablet   Other  Relevant Orders   Lipid panel   Comprehensive metabolic panel   Hyperlipidemia LDL goal <100    Tolerating statin, encouraged heart healthy diet, avoid trans fats, minimize simple carbs and saturated fats. Increase exercise as tolerated      Relevant Medications   valsartan-hydrochlorothiazide (DIOVAN-HCT) 160-25 MG tablet   Other Relevant Orders   Lipid panel   Comprehensive metabolic panel   Other Visit Diagnoses     Dyspepsia       Relevant Medications   pantoprazole (PROTONIX) 40 MG tablet       Return in about 6 months (around 06/12/2022), or if symptoms worsen or fail to improve, for annual exam, fasting.    [name], DO

## 2021-12-12 NOTE — Assessment & Plan Note (Signed)
Well controlled, no changes to meds. Encouraged heart healthy diet such as the DASH diet and exercise as tolerated.  °

## 2021-12-12 NOTE — Assessment & Plan Note (Signed)
Tolerating statin, encouraged heart healthy diet, avoid trans fats, minimize simple carbs and saturated fats. Increase exercise as tolerated 

## 2021-12-12 NOTE — Patient Instructions (Signed)
Food Choices for Gastroesophageal Reflux Disease, Adult When you have gastroesophageal reflux disease (GERD), the foods you eat and your eating habits are very important. Choosing the right foods can help ease the discomfort of GERD. Consider working with a dietitian to help you make healthy food choices. What are tips for following this plan? Reading food labels Look for foods that are low in saturated fat. Foods that have less than 5% of daily value (DV) of fat and 0 g of trans fats may help with your symptoms. Cooking Cook foods using methods other than frying. This may include baking, steaming, grilling, or broiling. These are all methods that do not need a lot of fat for cooking. To add flavor, try to use herbs that are low in spice and acidity. Meal planning  Choose healthy foods that are low in fat, such as fruits, vegetables, whole grains, low-fat dairy products, lean meats, fish, and poultry. Eat frequent, small meals instead of three large meals each day. Eat your meals slowly, in a relaxed setting. Avoid bending over or lying down until 2-3 hours after eating. Limit high-fat foods such as fatty meats or fried foods. Limit your intake of fatty foods, such as oils, butter, and shortening. Avoid the following as told by your health care provider: Foods that cause symptoms. These may be different for different people. Keep a food diary to keep track of foods that cause symptoms. Alcohol. Drinking large amounts of liquid with meals. Eating meals during the 2-3 hours before bed. Lifestyle Maintain a healthy weight. Ask your health care provider what weight is healthy for you. If you need to lose weight, work with your health care provider to do so safely. Exercise for at least 30 minutes on 5 or more days each week, or as told by your health care provider. Avoid wearing clothes that fit tightly around your waist and chest. Do not use any products that contain nicotine or tobacco. These  products include cigarettes, chewing tobacco, and vaping devices, such as e-cigarettes. If you need help quitting, ask your health care provider. Sleep with the head of your bed raised. Use a wedge under the mattress or blocks under the bed frame to raise the head of the bed. Chew sugar-free gum after mealtimes. What foods should I eat?  Eat a healthy, well-balanced diet of fruits, vegetables, whole grains, low-fat dairy products, lean meats, fish, and poultry. Each person is different. Foods that may trigger symptoms in one person may not trigger any symptoms in another person. Work with your health care provider to identify foods that are safe for you. The items listed above may not be a complete list of recommended foods and beverages. Contact a dietitian for more information. What foods should I avoid? Limiting some of these foods may help manage the symptoms of GERD. Everyone is different. Consult a dietitian or your health care provider to help you identify the exact foods to avoid, if any. Fruits Any fruits prepared with added fat. Any fruits that cause symptoms. For some people this may include citrus fruits, such as oranges, grapefruit, pineapple, and lemons. Vegetables Deep-fried vegetables. French fries. Any vegetables prepared with added fat. Any vegetables that cause symptoms. For some people, this may include tomatoes and tomato products, chili peppers, onions and garlic, and horseradish. Grains Pastries or quick breads with added fat. Meats and other proteins High-fat meats, such as fatty beef or pork, hot dogs, ribs, ham, sausage, salami, and bacon. Fried meat or protein, including   fried fish and fried chicken. Nuts and nut butters, in large amounts. Dairy Whole milk and chocolate milk. Sour cream. Cream. Ice cream. Cream cheese. Milkshakes. Fats and oils Butter. Margarine. Shortening. Ghee. Beverages Coffee and tea, with or without caffeine. Carbonated beverages. Sodas. Energy  drinks. Fruit juice made with acidic fruits, such as orange or grapefruit. Tomato juice. Alcoholic drinks. Sweets and desserts Chocolate and cocoa. Donuts. Seasonings and condiments Pepper. Peppermint and spearmint. Added salt. Any condiments, herbs, or seasonings that cause symptoms. For some people, this may include curry, hot sauce, or vinegar-based salad dressings. The items listed above may not be a complete list of foods and beverages to avoid. Contact a dietitian for more information. Questions to ask your health care provider Diet and lifestyle changes are usually the first steps that are taken to manage symptoms of GERD. If diet and lifestyle changes do not improve your symptoms, talk with your health care provider about taking medicines. Where to find more information [employer]: aboutgerd.org Summary When you have gastroesophageal reflux disease (GERD), food and lifestyle choices may be very helpful in easing the discomfort of GERD. Eat frequent, small meals instead of three large meals each day. Eat your meals slowly, in a relaxed setting. Avoid bending over or lying down until 2-3 hours after eating. Limit high-fat foods such as fatty meats or fried foods. This information is not intended to replace advice given to you by your health care provider. Make sure you discuss any questions you have with your health care provider. Document Revised: 10/05/2019 Document Reviewed: 10/05/2019 Elsevier Patient Education  2023 [employer]  

## 2021-12-13 LAB — COMPREHENSIVE METABOLIC PANEL
ALT: 14 U/L (ref 0–35)
AST: 18 U/L (ref 0–37)
Albumin: 4.1 g/dL (ref 3.5–5.2)
Alkaline Phosphatase: 62 U/L (ref 39–117)
BUN: 18 mg/dL (ref 6–23)
CO2: 29 mEq/L (ref 19–32)
Calcium: 10.3 mg/dL (ref 8.4–10.5)
Chloride: 101 mEq/L (ref 96–112)
Creatinine, Ser: 0.76 mg/dL (ref 0.40–1.20)
GFR: 77.66 mL/min (ref >60.00)
Glucose, Bld: 100 mg/dL — ABNORMAL HIGH (ref 70–99)
Potassium: 3.5 mEq/L (ref 3.5–5.1)
Sodium: 139 mEq/L (ref 135–145)
Total Bilirubin: 0.6 mg/dL (ref 0.2–1.2)
Total Protein: 7.7 g/dL (ref 6.0–8.3)

## 2021-12-13 LAB — LIPID PANEL
Cholesterol: 147 mg/dL (ref 0–200)
HDL: 55.8 mg/dL (ref >39.00)
LDL Cholesterol: 77 mg/dL (ref 0–99)
NonHDL: 91.03
Total CHOL/HDL Ratio: 3
Triglycerides: 72 mg/dL (ref 0.0–149.0)
VLDL: 14.4 mg/dL (ref 0.0–40.0)

## 2021-12-18 ENCOUNTER — Ambulatory Visit (INDEPENDENT_AMBULATORY_CARE_PROVIDER_SITE_OTHER): Payer: Medicare Other | Admitting: *Deleted

## 2021-12-18 DIAGNOSIS — Z0000 Encounter for general adult medical examination without abnormal findings: Secondary | ICD-10-CM | POA: Diagnosis not present

## 2021-12-18 NOTE — Patient Instructions (Signed)
Candace Bell , Thank you for taking time to come for your Medicare Wellness Visit. I appreciate your ongoing commitment to your health goals. Please review the following plan we discussed and let me know if I can assist you in the future.   These are the goals we discussed:  Goals      Maintain exercising 2x/ week and current weight     Patient Stated     Eat healthier & drink more water        This is a list of the screening recommended for you and due dates:  Health Maintenance  Topic Date Due   Tetanus Vaccine  Never done   Zoster (Shingles) Vaccine (1 of 2) Never done   COVID-19 Vaccine (6 - Pfizer series) 06/22/2021   Flu Shot  11/07/2021   Mammogram  01/24/2023   Colon Cancer Screening  01/26/2024   Pneumonia Vaccine  Completed   DEXA scan (bone density measurement)  Completed   Hepatitis C Screening: USPSTF Recommendation to screen - Ages 51-79 yo.  Completed   HPV Vaccine  Aged Out       Next appointment: Follow up in one year for your annual wellness visit    Preventive Care 65 Years and Older, Female Preventive care refers to lifestyle choices and visits with your health care provider that can promote health and wellness. What does preventive care include? A yearly physical exam. This is also called an annual well check. Dental exams once or twice a year. Routine eye exams. Ask your health care provider how often you should have your eyes checked. Personal lifestyle choices, including: Daily care of your teeth and gums. Regular physical activity. Eating a healthy diet. Avoiding tobacco and drug use. Limiting alcohol use. Practicing safe sex. Taking low-dose aspirin every day. Taking vitamin and mineral supplements as recommended by your health care provider. What happens during an annual well check? The services and screenings done by your health care provider during your annual well check will depend on your age, overall health, lifestyle risk factors, and  family history of disease. Counseling  Your health care provider may ask you questions about your: Alcohol use. Tobacco use. Drug use. Emotional well-being. Home and relationship well-being. Sexual activity. Eating habits. History of falls. Memory and ability to understand (cognition). Work and work Statistician. Reproductive health. Screening  You may have the following tests or measurements: Height, weight, and BMI. Blood pressure. Lipid and cholesterol levels. These may be checked every 5 years, or more frequently if you are over 87 years old. Skin check. Lung cancer screening. You may have this screening every year starting at age 59 if you have a 30-pack-year history of smoking and currently smoke or have quit within the past 15 years. Fecal occult blood test (FOBT) of the stool. You may have this test every year starting at age 86. Flexible sigmoidoscopy or colonoscopy. You may have a sigmoidoscopy every 5 years or a colonoscopy every 10 years starting at age 34. Hepatitis C blood test. Hepatitis B blood test. Sexually transmitted disease (STD) testing. Diabetes screening. This is done by checking your blood sugar (glucose) after you have not eaten for a while (fasting). You may have this done every 1-3 years. Bone density scan. This is done to screen for osteoporosis. You may have this done starting at age 61. Mammogram. This may be done every 1-2 years. Talk to your health care provider about how often you should have regular mammograms. Talk with  your health care provider about your test results, treatment options, and if necessary, the need for more tests. Vaccines  Your health care provider may recommend certain vaccines, such as: Influenza vaccine. This is recommended every year. Tetanus, diphtheria, and acellular pertussis (Tdap, Td) vaccine. You may need a Td booster every 10 years. Zoster vaccine. You may need this after age 70. Pneumococcal 13-valent conjugate  (PCV13) vaccine. One dose is recommended after age 73. Pneumococcal polysaccharide (PPSV23) vaccine. One dose is recommended after age 3. Talk to your health care provider about which screenings and vaccines you need and how often you need them. This information is not intended to replace advice given to you by your health care provider. Make sure you discuss any questions you have with your health care provider. Document Released: 04/22/2015 Document Revised: 12/14/2015 Document Reviewed: 01/25/2015 Elsevier Interactive Patient Education  2017 [employer] Prevention in the Home Falls can cause injuries. They can happen to people of all ages. There are many things you can do to make your home safe and to help prevent falls. What can I do on the outside of my home? Regularly fix the edges of walkways and driveways and fix any cracks. Remove anything that might make you trip as you walk through a door, such as a raised step or threshold. Trim any bushes or trees on the path to your home. Use bright outdoor lighting. Clear any walking paths of anything that might make someone trip, such as rocks or tools. Regularly check to see if handrails are loose or broken. Make sure that both sides of any steps have handrails. Any raised decks and porches should have guardrails on the edges. Have any leaves, snow, or ice cleared regularly. Use sand or salt on walking paths during winter. Clean up any spills in your garage right away. This includes oil or grease spills. What can I do in the bathroom? Use night lights. Install grab bars by the toilet and in the tub and shower. Do not use towel bars as grab bars. Use non-skid mats or decals in the tub or shower. If you need to sit down in the shower, use a plastic, non-slip stool. Keep the floor dry. Clean up any water that spills on the floor as soon as it happens. Remove soap buildup in the tub or shower regularly. Attach bath mats securely with  double-sided non-slip rug tape. Do not have throw rugs and other things on the floor that can make you trip. What can I do in the bedroom? Use night lights. Make sure that you have a light by your bed that is easy to reach. Do not use any sheets or blankets that are too big for your bed. They should not hang down onto the floor. Have a firm chair that has side arms. You can use this for support while you get dressed. Do not have throw rugs and other things on the floor that can make you trip. What can I do in the kitchen? Clean up any spills right away. Avoid walking on wet floors. Keep items that you use a lot in easy-to-reach places. If you need to reach something above you, use a strong step stool that has a grab bar. Keep electrical cords out of the way. Do not use floor polish or wax that makes floors slippery. If you must use wax, use non-skid floor wax. Do not have throw rugs and other things on the floor that can make you trip.  What can I do with my stairs? Do not leave any items on the stairs. Make sure that there are handrails on both sides of the stairs and use them. Fix handrails that are broken or loose. Make sure that handrails are as long as the stairways. Check any carpeting to make sure that it is firmly attached to the stairs. Fix any carpet that is loose or worn. Avoid having throw rugs at the top or bottom of the stairs. If you do have throw rugs, attach them to the floor with carpet tape. Make sure that you have a light switch at the top of the stairs and the bottom of the stairs. If you do not have them, ask someone to add them for you. What else can I do to help prevent falls? Wear shoes that: Do not have high heels. Have rubber bottoms. Are comfortable and fit you well. Are closed at the toe. Do not wear sandals. If you use a stepladder: Make sure that it is fully opened. Do not climb a closed stepladder. Make sure that both sides of the stepladder are locked  into place. Ask someone to hold it for you, if possible. Clearly mark and make sure that you can see: Any grab bars or handrails. First and last steps. Where the edge of each step is. Use tools that help you move around (mobility aids) if they are needed. These include: Canes. Walkers. Scooters. Crutches. Turn on the lights when you go into a dark area. Replace any light bulbs as soon as they burn out. Set up your furniture so you have a clear path. Avoid moving your furniture around. If any of your floors are uneven, fix them. If there are any pets around you, be aware of where they are. Review your medicines with your doctor. Some medicines can make you feel dizzy. This can increase your chance of falling. Ask your doctor what other things that you can do to help prevent falls. This information is not intended to replace advice given to you by your health care provider. Make sure you discuss any questions you have with your health care provider. Document Released: 01/20/2009 Document Revised: 09/01/2015 Document Reviewed: 04/30/2014 Elsevier Interactive Patient Education  2017 [employer].
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
## 2022-01-24 ENCOUNTER — Other Ambulatory Visit (HOSPITAL_BASED_OUTPATIENT_CLINIC_OR_DEPARTMENT_OTHER): Payer: Self-pay

## 2022-01-24 MED ORDER — COMIRNATY 30 MCG/0.3ML IM SUSY
PREFILLED_SYRINGE | INTRAMUSCULAR | 0 refills | Status: DC
Start: 2022-01-24 — End: 2022-12-14
  Filled 2022-01-24: qty 0.3, 1d supply, fill #0

## 2022-01-29 DIAGNOSIS — Z1231 Encounter for screening mammogram for malignant neoplasm of breast: Secondary | ICD-10-CM | POA: Diagnosis not present

## 2022-01-29 LAB — HM MAMMOGRAPHY

## 2022-02-01 ENCOUNTER — Encounter: Payer: Self-pay | Admitting: *Deleted

## 2022-02-22 ENCOUNTER — Ambulatory Visit: Payer: Medicare Other | Admitting: Podiatry

## 2022-02-22 DIAGNOSIS — B351 Tinea unguium: Secondary | ICD-10-CM

## 2022-02-22 DIAGNOSIS — M79675 Pain in left toe(s): Secondary | ICD-10-CM

## 2022-02-22 DIAGNOSIS — M79674 Pain in right toe(s): Secondary | ICD-10-CM | POA: Diagnosis not present

## 2022-02-22 NOTE — Progress Notes (Signed)
Subjective:   Patient ID: Candace Bell, female   DOB: 71 y.o.   MRN: [id number]   HPI Chief Complaint  Patient presents with   Nail Problem    Routine foot care, nail trim and nail fungus, nails on the right foot are sore, nails and thick and dark in color     71 year old female presents above concerns.  She says she has had nail issues for quite some time and never had any treatment for this.  The nails do cause discomfort thickened elongated and thick.  No swelling redness or any drainage.  No open lesions.  Review of Systems  All other systems reviewed and are negative.  Past Medical History:  Diagnosis Date   Arthritis    generalized   Cataract 2015   bilateral sx   GERD (gastroesophageal reflux disease)    with certain foods   Hypertension    on meds   Osteopenia 10/13/2015    Past Surgical History:  Procedure Laterality Date   ABDOMINAL HYSTERECTOMY  04/10/1991   TAH --took one ovary   BACK SURGERY  2002   BUNIONECTOMY WITH HAMMERTOE RECONSTRUCTION Right 2014   CATARACT EXTRACTION Bilateral 04/09/2013   COLONOSCOPY  2015   MS-MAC-movi(exc)-ulcerated polyp     Current Outpatient Medications:    COVID-19 mRNA vaccine 2023-2024 (COMIRNATY) syringe, Inject into the muscle., Disp: 0.3 mL, Rfl: 0   naproxen sodium (ALEVE) 220 MG tablet, Take 220 mg by mouth daily as needed., Disp: , Rfl:    pantoprazole (PROTONIX) 40 MG tablet, Take 1 tablet (40 mg total) by mouth daily., Disp: 30 tablet, Rfl: 3   valsartan-hydrochlorothiazide (DIOVAN-HCT) 160-25 MG tablet, Take 1 tablet by mouth daily., Disp: 90 tablet, Rfl: 1  Allergies  Allergen Reactions   Amoxicillin Anaphylaxis and Swelling   Other Anaphylaxis    All -cillins     Penicillins Anaphylaxis and Swelling           Objective:  Physical Exam  General: AAO x3, NAD  Dermatological: Nails are hypertrophic, dystrophic, brittle, discolored, elongated 10.  The nails have yellow, brown discoloration.   There is some gapping of the nails but uniform across all toenails.  There is no extensively hyperpigmentation in the surrounding skin.  No surrounding redness or drainage. Tenderness nails 1-5 bilaterally. No open lesions or pre-ulcerative lesions are identified today.  Vascular: Dorsalis Pedis artery and Posterior Tibial artery pedal pulses are 2/4 bilateral with immedate capillary fill time.  There is no pain with calf compression, swelling, warmth, erythema.   Neruologic: Grossly intact via light touch bilateral.   Musculoskeletal: No other areas of discomfort at this time.  Gait: Unassisted, Nonantalgic.       Assessment:   Symptomatic onychomycosis     Plan:  -Treatment options discussed including all alternatives, risks, and complications -Etiology of symptoms were discussed -Nails debrided 10 without complications or bleeding.  We discussed different treatment options for nail fungus as well but she does not want to proceed with further treatment and she wants to proceed with routine debridement. -Daily foot inspection -Follow-up in 3 months or sooner if any problems arise. In the meantime, encouraged to call the office with any questions, concerns, change in symptoms.   Celesta Gentile, DPM

## 2022-03-10 ENCOUNTER — Other Ambulatory Visit: Payer: Self-pay | Admitting: Family Medicine

## 2022-03-10 DIAGNOSIS — R1013 Epigastric pain: Secondary | ICD-10-CM

## 2022-04-18 ENCOUNTER — Other Ambulatory Visit (HOSPITAL_BASED_OUTPATIENT_CLINIC_OR_DEPARTMENT_OTHER): Payer: Self-pay

## 2022-04-18 MED ORDER — AREXVY 120 MCG/0.5ML IM SUSR
INTRAMUSCULAR | 0 refills | Status: DC
  Filled 2022-04-18: qty 0.5, 1d supply, fill #0

## 2022-04-25 DIAGNOSIS — I10 Essential (primary) hypertension: Secondary | ICD-10-CM | POA: Diagnosis not present

## 2022-05-28 ENCOUNTER — Ambulatory Visit: Payer: Medicare Other | Admitting: Podiatry

## 2022-05-28 DIAGNOSIS — M79675 Pain in left toe(s): Secondary | ICD-10-CM

## 2022-05-28 DIAGNOSIS — M79674 Pain in right toe(s): Secondary | ICD-10-CM | POA: Diagnosis not present

## 2022-05-28 DIAGNOSIS — B351 Tinea unguium: Secondary | ICD-10-CM

## 2022-05-30 NOTE — Progress Notes (Signed)
Subjective: Chief Complaint  Patient presents with   Nail Problem    Thick painful toenails, 3 month follow up    72 y.o. returns the office today for painful, elongated, thickened toenails which she cannot trim herself. Denies any redness or drainage around the nails. Denies any acute changes since last appointment and no new complaints today. Denies any systemic complaints such as fevers, chills, nausea, vomiting.   PCP: [name], DO Last Seen: 12/12/2021  Objective: AAO 3, NAD DP/PT pulses palpable, CRT less than 3 seconds Nails hypertrophic, dystrophic, elongated, brittle, discolored 10.There is no hyperpigmentation to the surrounding nails and all the nails to be about the same in color.  There is tenderness overlying the nails 1-5 bilaterally. There is no surrounding erythema or drainage along the nail sites. No open lesions or pre-ulcerative lesions are identified. No other areas of tenderness bilateral lower extremities. No overlying edema, erythema, increased warmth. No pain with calf compression, swelling, warmth, erythema.  Assessment: Patient presents with symptomatic onychomycosis  Plan: -Treatment options including alternatives, risks, complications were discussed -Nails sharply debrided 10 without complication/bleeding. -Discussed daily foot inspection. If there are any changes, to call the office immediately.  -Follow-up in 3 months or sooner if any problems are to arise. In the meantime, encouraged to call the office with any questions, concerns, changes symptoms.  Celesta Gentile, DPM

## 2022-08-30 ENCOUNTER — Ambulatory Visit: Payer: Medicare Other | Admitting: Podiatry

## 2022-09-14 ENCOUNTER — Encounter: Payer: Self-pay | Admitting: *Deleted

## 2022-09-14 ENCOUNTER — Other Ambulatory Visit: Payer: Self-pay | Admitting: Family Medicine

## 2022-09-14 DIAGNOSIS — R1013 Epigastric pain: Secondary | ICD-10-CM

## 2022-09-14 DIAGNOSIS — I10 Essential (primary) hypertension: Secondary | ICD-10-CM

## 2022-09-27 ENCOUNTER — Ambulatory Visit: Payer: Medicare Other | Admitting: Podiatry

## 2022-09-27 DIAGNOSIS — M79675 Pain in left toe(s): Secondary | ICD-10-CM

## 2022-09-27 DIAGNOSIS — M79674 Pain in right toe(s): Secondary | ICD-10-CM | POA: Diagnosis not present

## 2022-09-27 DIAGNOSIS — B351 Tinea unguium: Secondary | ICD-10-CM | POA: Diagnosis not present

## 2022-09-29 NOTE — Progress Notes (Signed)
Subjective: Chief Complaint  Patient presents with   Nail Problem    Nail trim      72 y.o. returns the office today for painful, elongated, thickened toenails which she cannot trim herself.  Does not report any swelling or redness or drainage of the toenail sites.  No other lesions.    PCP: Candace Schultz, DO Last Seen: 12/12/2021  Objective: AAO 3, NAD DP/PT pulses palpable, CRT less than 3 seconds Nails hypertrophic, dystrophic, elongated, brittle, discolored 10.There is no hyperpigmentation to the surrounding nails and all the nails to be about the same in color.  There is tenderness overlying the nails 1-5 bilaterally. There is no surrounding erythema or drainage along the nail sites. No open lesions. No pain with calf compression, swelling, warmth, erythema.  Assessment: Patient presents with symptomatic onychomycosis  Plan: -Treatment options including alternatives, risks, complications were discussed -Nails sharply debrided 10 without complication/bleeding.  She wants to hold off on further treatment for the toenails.  Will continue with routine debridement. -Discussed daily foot inspection. If there are any changes, to call the office immediately.  -Follow-up in 3 months or sooner if any problems are to arise. In the meantime, encouraged to call the office with any questions, concerns, changes symptoms.  Candace Bell, DPM

## 2022-10-03 IMAGING — CT CT ANGIO CHEST
2 of 9 series · 19 of 36 positions shown · IV contrast (Omnipaque)
Comparison: None.

CLINICAL DATA: Shortness of breath

EXAM:
CT ANGIOGRAPHY CHEST WITH CONTRAST
TECHNIQUE: Multidetector CT imaging of the chest was performed using the
standard protocol during bolus administration of intravenous
contrast. Multiplanar CT image reconstructions and MIPs were
obtained to evaluate the vascular anatomy.
CONTRAST:  100mL OMNIPAQUE IOHEXOL 350 MG/ML SOLN

[Series 7: pe coronal mpr · coronal · 0.57mm/px · 1 of 128 slices shown]
[im 64/128  mediastinal]
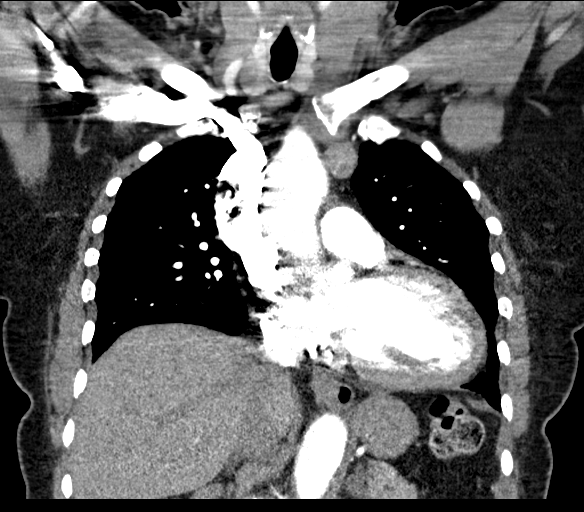

[Series 11: pe thins · axial · 0.63mm/px · z∈[-283,-28]mm · 18 of 285 slices shown]
[im 15/285  lung]
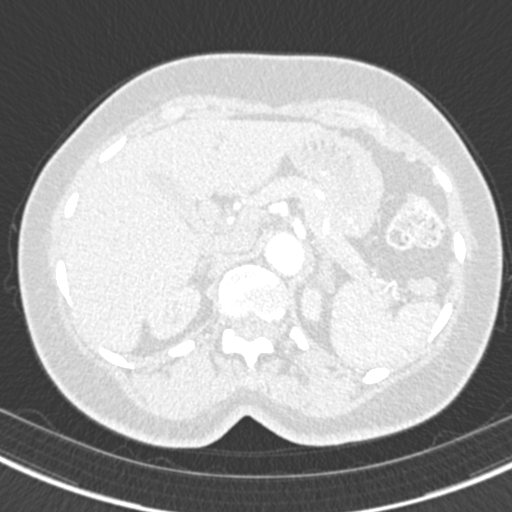
[im 30/285  mediastinal]
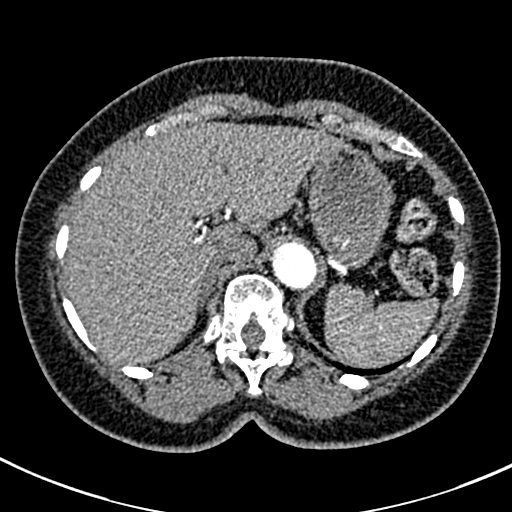
[im 45/285  lung]
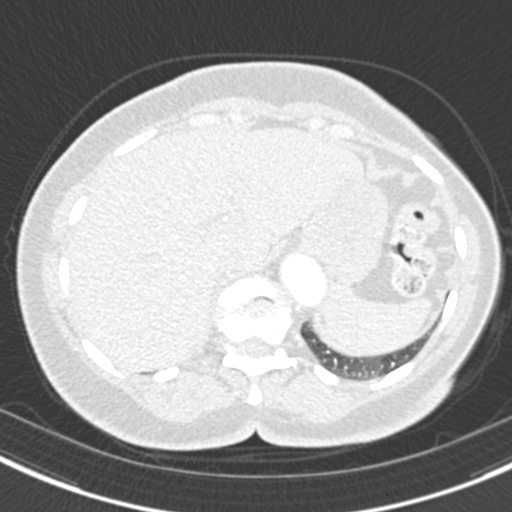
[im 60/285  mediastinal]
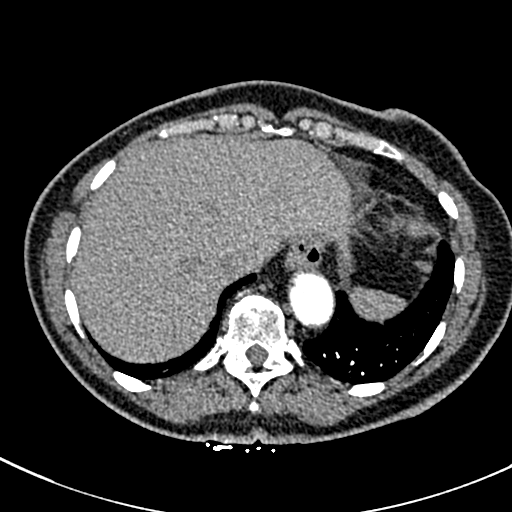
[im 75/285  lung]
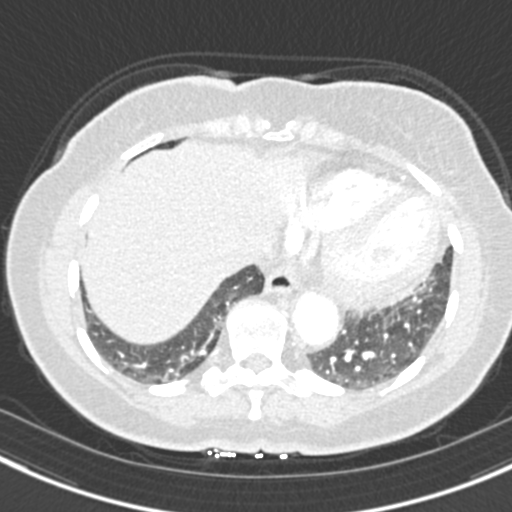
[im 90/285  mediastinal]
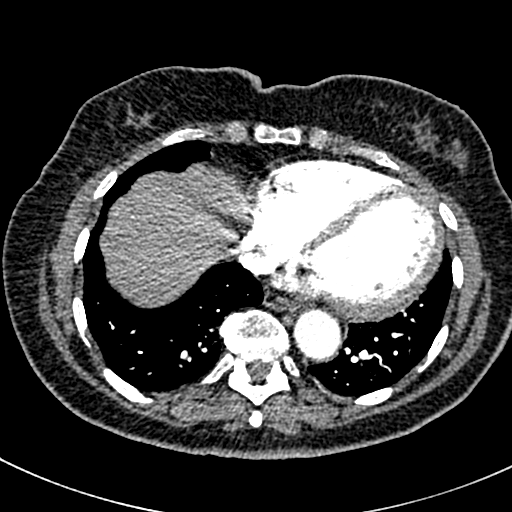
[im 105/285  lung]
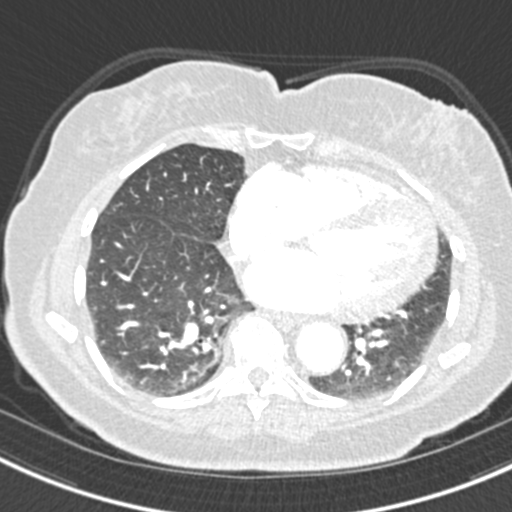
[im 120/285  mediastinal]
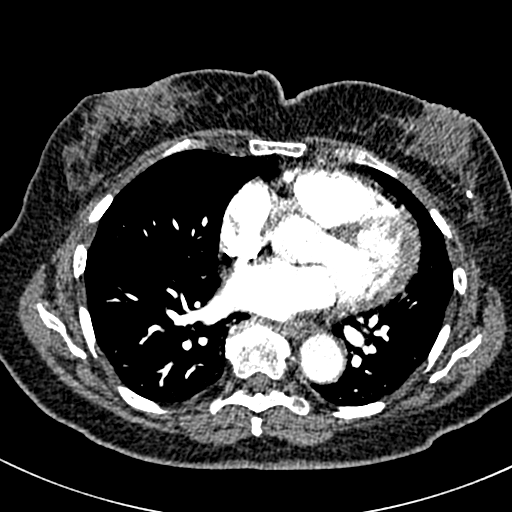
[im 135/285  lung]
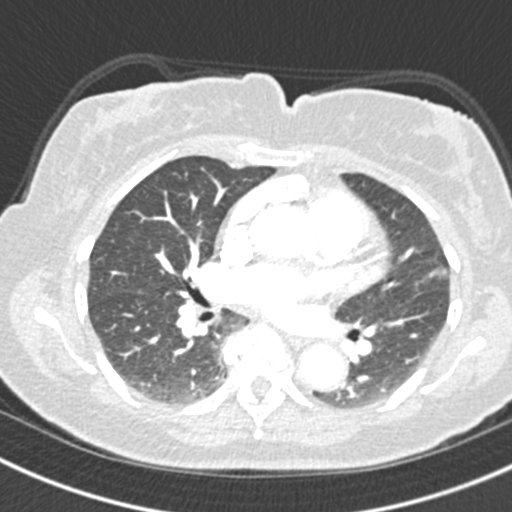
[im 150/285  mediastinal]
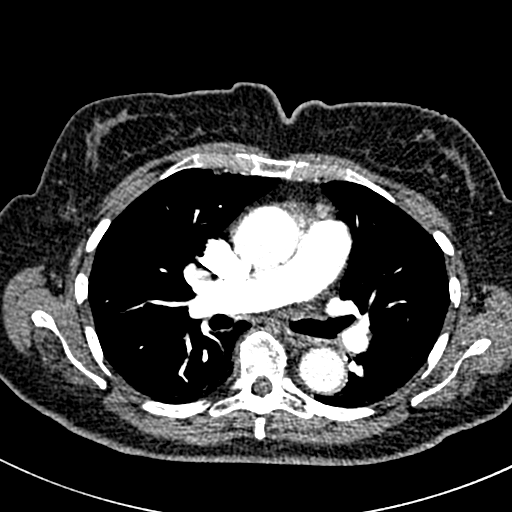
[im 165/285  lung]
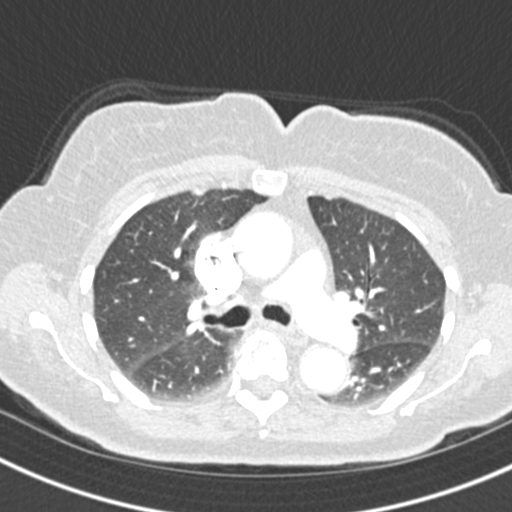
[im 180/285  mediastinal]
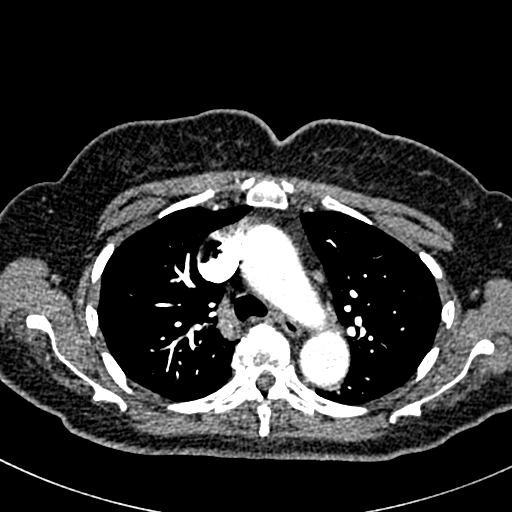
[im 195/285  lung]
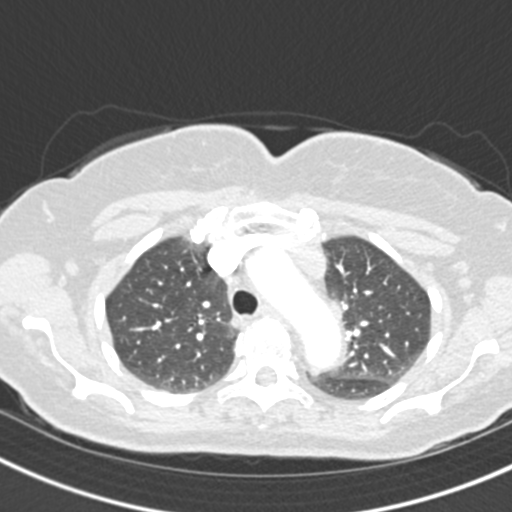
[im 210/285  mediastinal]
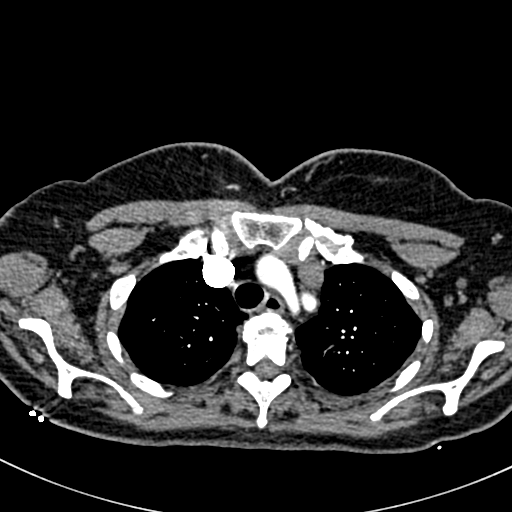
[im 225/285  lung]
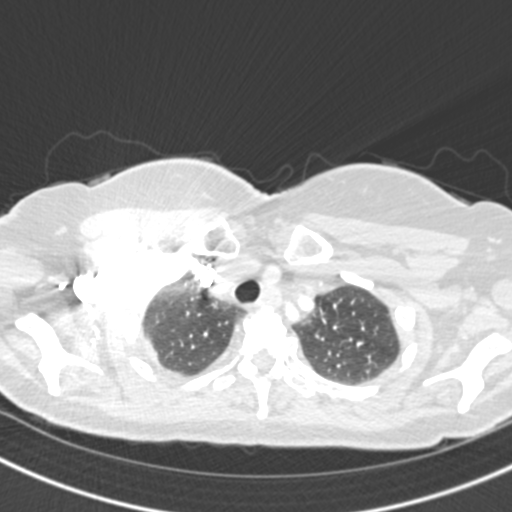
[im 240/285  mediastinal]
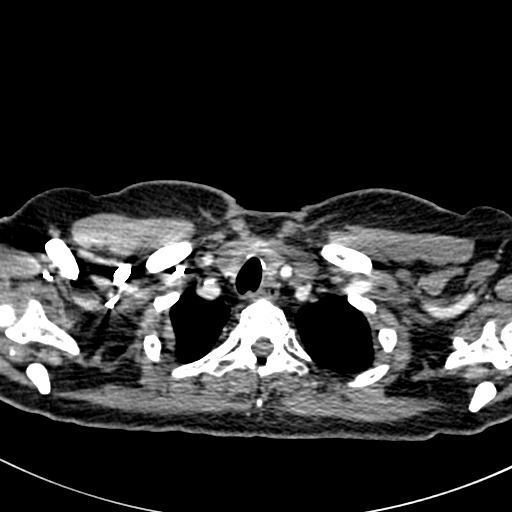
[im 255/285  lung]
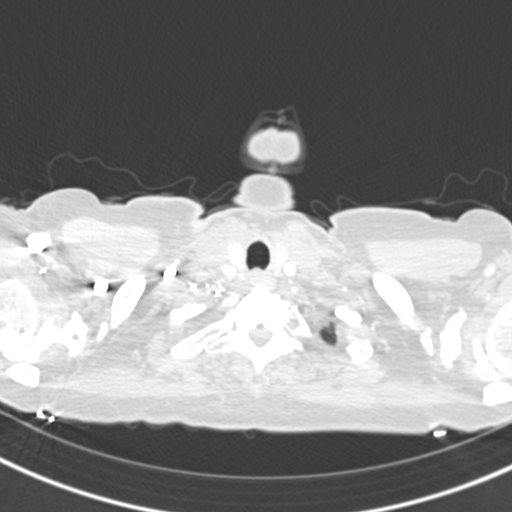
[im 270/285  mediastinal]
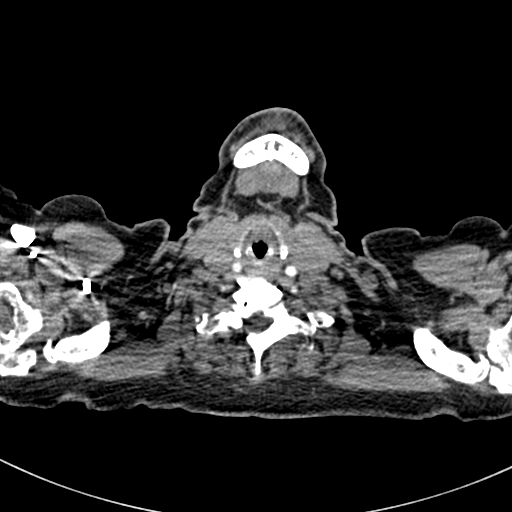

[19 of 36 positions shown; findings below may reference images not displayed]

FINDINGS: Cardiovascular: Satisfactory opacification of the pulmonary arteries
to the segmental level. No evidence of pulmonary embolism. Normal
heart size. No pericardial effusion.

Mediastinum/Nodes: No enlarged lymph nodes. Thyroid and esophagus
are unremarkable.

Lungs/Pleura: No consolidation or mass. No pleural effusion or
pneumothorax.

Upper Abdomen: Small hiatal hernia. Top-normal main pancreatic duct
partially imaged. Visualized common bile duct is not appear dilated.

Musculoskeletal: Degenerative changes of the included spine. No
acute osseous abnormality.

Review of the MIP images confirms the above findings.
IMPRESSION: No acute pulmonary embolism or other acute abnormality.

## 2022-10-17 ENCOUNTER — Ambulatory Visit: Payer: Medicare Other | Admitting: Orthopaedic Surgery

## 2022-10-17 ENCOUNTER — Other Ambulatory Visit (INDEPENDENT_AMBULATORY_CARE_PROVIDER_SITE_OTHER): Payer: Medicare Other

## 2022-10-17 DIAGNOSIS — G8929 Other chronic pain: Secondary | ICD-10-CM

## 2022-10-17 DIAGNOSIS — M25561 Pain in right knee: Secondary | ICD-10-CM

## 2022-10-17 NOTE — Progress Notes (Signed)
Office Visit Note   Patient: Candace Bell           Date of Birth: Nov 20, 1950           MRN: [id number] Visit Date: 10/17/2022              Requested by: [address], [place], [place] [address] [place],  [place] [address] PCP: Zola Button, Grayling Congress, DO   Assessment & Plan: Visit Diagnoses:  1. Chronic pain of right knee     Plan:  Given the severity of patient's right knee and the fact that this is affecting her quality of life she would like to proceed with surgery.  Surgery would be right total knee arthroplasty.  Risk benefits of surgery discussed.  Postoperative protocol discussed.  Risk include but is not limited to DVT/PE, wound healing problems, infection, nerve vessel injury, and blood loss.  Questions were encouraged and answered at length by Dr. Magnus Ivan and myself.  Knee replacement model was shown.  She will follow-up with [place] 2 weeks postop.  Follow-Up Instructions: No follow-ups on file.   Orders:  Orders Placed This Encounter  Procedures   XR Knee 1-2 Views Right   No orders of the defined types were placed in this encounter.     Procedures: No procedures performed   Clinical Data: No additional findings.   Subjective: Chief Complaint  Patient presents with   Right Knee - Pain    HPI Candace Bell 72 year old female comes in today due to right knee pain.  She is wanting to discuss surgery.  She has had right knee pain for years.  Feels that her overall symptoms are getting worse.  Notes that the knee gives way.  Pain is 9 out of 10 at worst.  She states she is unable to walk for exercise and he was unable to shop and she enjoyed both these activities or knee got to the point that she was falling.  She does take Aleve and Tylenol and these help with the pain in the knee.  She uses no braces assistive devices.  She has had no treatments for the knee pain.  Patient is nondiabetic.  She has history pertinent for hypertension, osteopenia, and  lumbar surgery. Review of Systems Denies any fevers, chills, chest pain, shortness of breath, or orthopnea.  Objective: Vital Signs: There were no vitals taken for this visit.  Physical Exam Constitutional:      Appearance: She is not ill-appearing or diaphoretic.  Pulmonary:     Effort: Pulmonary effort is normal.  Neurological:     Mental Status: She is alert and oriented to person, place, and time.  Psychiatric:        Mood and Affect: Mood normal.     Ortho Exam Bilateral knees: Right knee 0 to 90 degrees of flexion actively and passively.  Left knee 0 to 110 degrees.  No effusion abnormal warmth erythema of either knee.  Right knee global tenderness.  Valgus deformity right knee.  Slight valgus deformity left knee.  No instability valgus varus stressing of either knee.  Specialty Comments:  No specialty comments available.  Imaging: XR Knee 1-2 Views Right  Result Date: 10/17/2022 Right knee 2 views: Significant valgus deformity.  Bone-on-bone lateral compartment.  Osteophytes off the lateral compartment.  Severe patellofemoral changes.  No acute fractures.  No bony lesions.    PMFS History: Patient Active Problem List   Diagnosis Date Noted  Preventative health care 07/03/2021   Upper extremity weakness 11/18/2020   Hypertension    Arthritis    Other fatigue 03/07/2020   Loss of appetite 03/07/2020   SOB (shortness of breath) 03/07/2020   Depression, major, single episode, mild (HCC) 03/07/2020   Osteoarthritis of knee 03/07/2020   Left hand pain 10/21/2018   Hyperlipidemia LDL goal <100 01/26/2018   Memory loss 04/16/2017   Osteopenia 10/13/2015   Back pain 11/25/2013   Essential hypertension 02/29/2012   Past Medical History:  Diagnosis Date   Arthritis    generalized   Cataract 2015   bilateral sx   GERD (gastroesophageal reflux disease)    with certain foods   Hypertension    on meds   Osteopenia 10/13/2015    Family History  Problem Relation  Age of Onset   Colon polyps Mother    Diabetes Mother    Dementia Father    Colon polyps Sister    Diabetes Brother    Stroke Brother    Esophageal cancer Neg Hx    Rectal cancer Neg Hx    Stomach cancer Neg Hx    Colon cancer Neg Hx     Past Surgical History:  Procedure Laterality Date   ABDOMINAL HYSTERECTOMY  04/10/1991   TAH --took one ovary   BACK SURGERY  2002   BUNIONECTOMY WITH HAMMERTOE RECONSTRUCTION Right 2014   CATARACT EXTRACTION Bilateral 04/09/2013   COLONOSCOPY  2015   MS-MAC-movi(exc)-ulcerated polyp   Social History   Occupational History    Employer: [employer]    Comment: retired  Tobacco Use   Smoking status: Never   Smokeless tobacco: Never  Vaping Use   Vaping Use: Never used  Substance and Sexual Activity   Alcohol use: Yes    Alcohol/week: 0.0 standard drinks of alcohol    Comment: Occasional wine monthly   Drug use: No   Sexual activity: Not Currently    Partners: Male

## 2022-11-29 ENCOUNTER — Other Ambulatory Visit: Payer: Self-pay

## 2022-12-03 NOTE — Patient Instructions (Signed)
SURGICAL WAITING ROOM VISITATION  Patients having surgery or a procedure may have no more than 2 support people in the waiting area - these visitors may rotate.    Children under the age of 77 must have an adult with them who is not the patient.  Due to an increase in RSV and influenza rates and associated hospitalizations, children ages 66 and under may not visit patients in [facility] hospitals.  If the patient needs to stay at the hospital during part of their recovery, the visitor guidelines for inpatient rooms apply. Pre-op nurse will coordinate an appropriate time for 1 support person to accompany patient in pre-op.  This support person may not rotate.    Please refer to the [facility] website for the visitor guidelines for Inpatients (after your surgery is over and you are in a regular room).       Your procedure is scheduled on:  12/14/2022    Report to [facility] Main Entrance    Report to admitting at  1015 AM   Call this number if you have problems the morning of surgery [phone]   Do not eat food :After Midnight.   After Midnight you may have the following liquids until _ 0945_____ AM DAY OF SURGERY  Water Non-Citrus Juices (without pulp, NO RED-Apple, White grape, White cranberry) Black Coffee (NO MILK/CREAM OR CREAMERS, sugar ok)  Clear Tea (NO MILK/CREAM OR CREAMERS, sugar ok) regular and decaf                             Plain Jell-O (NO RED)                                           Fruit ices (not with fruit pulp, NO RED)                                     Popsicles (NO RED)                                                               Sports drinks like Gatorade (NO RED)                    The day of surgery:  Drink ONE (1) Pre-Surgery Clear Ensure or G2 at   0945AM  ( have completed by ) the morning of surgery. Drink in one sitting. Do not sip.  This drink was given to you during your hospital  pre-op appointment visit. Nothing else to  drink after completing the  Pre-Surgery Clear Ensure or G2.          If you have questions, please contact your surgeon's office.       Oral Hygiene is also important to reduce your risk of infection.                                    Remember - BRUSH YOUR TEETH THE MORNING OF SURGERY WITH  YOUR REGULAR TOOTHPASTE  DENTURES WILL BE REMOVED PRIOR TO SURGERY PLEASE DO NOT APPLY "Poly grip" OR ADHESIVES!!!   Do NOT smoke after Midnight   Stop all vitamins and herbal supplements 7 days before surgery.   Take these medicines the morning of surgery with A SIP OF WATER:  protonix if needed   DO NOT TAKE ANY ORAL DIABETIC MEDICATIONS DAY OF YOUR SURGERY  Bring CPAP mask and tubing day of surgery.                              You may not have any metal on your body including hair pins, jewelry, and body piercing             Do not wear make-up, lotions, powders, perfumes/cologne, or deodorant  Do not wear nail polish including gel and S&S, artificial/acrylic nails, or any other type of covering on natural nails including finger and toenails. If you have artificial nails, gel coating, etc. that needs to be removed by a nail salon please have this removed prior to surgery or surgery may need to be canceled/ delayed if the surgeon/ anesthesia feels like they are unable to be safely monitored.   Do not shave  48 hours prior to surgery.               Men may shave face and neck.   Do not bring valuables to the hospital. [facility] IS NOT             RESPONSIBLE   FOR VALUABLES.   Contacts, glasses, dentures or bridgework may not be worn into surgery.   Bring small overnight bag day of surgery.   DO NOT BRING YOUR HOME MEDICATIONS TO THE HOSPITAL. PHARMACY WILL DISPENSE MEDICATIONS LISTED ON YOUR MEDICATION LIST TO YOU DURING YOUR ADMISSION IN THE HOSPITAL!    Patients discharged on the day of surgery will not be allowed to drive home.  Someone NEEDS to stay with you for the first 24  hours after anesthesia.   Special Instructions: Bring a copy of your healthcare power of attorney and living will documents the day of surgery if you haven't scanned them before.              Please read over the following fact sheets you were given: IF YOU HAVE QUESTIONS ABOUT YOUR PRE-OP INSTRUCTIONS PLEASE CALL [phone]   If you received a COVID test during your pre-op visit  it is requested that you wear a mask when out in public, stay away from anyone that may not be feeling well and notify your surgeon if you develop symptoms. If you test positive for Covid or have been in contact with anyone that has tested positive in the last 10 days please notify you surgeon.      Pre-operative 5 CHG Bath Instructions   You can play a key role in reducing the risk of infection after surgery. Your skin needs to be as free of germs as possible. You can reduce the number of germs on your skin by washing with CHG (chlorhexidine gluconate) soap before surgery. CHG is an antiseptic soap that kills germs and continues to kill germs even after washing.   DO NOT use if you have an allergy to chlorhexidine/CHG or antibacterial soaps. If your skin becomes reddened or irritated, stop using the CHG and notify one of our RNs at [phone].   Please shower with the CHG  soap starting 4 days before surgery using the following schedule:     Please keep in mind the following:  DO NOT shave, including legs and underarms, starting the day of your first shower.   You may shave your face at any point before/day of surgery.  Place clean sheets on your bed the day you start using CHG soap. Use a clean washcloth (not used since being washed) for each shower. DO NOT sleep with pets once you start using the CHG.   CHG Shower Instructions:  If you choose to wash your hair and private area, wash first with your normal shampoo/soap.  After you use shampoo/soap, rinse your hair and body thoroughly to remove  shampoo/soap residue.  Turn the water OFF and apply about 3 tablespoons (45 ml) of CHG soap to a CLEAN washcloth.  Apply CHG soap ONLY FROM YOUR NECK DOWN TO YOUR TOES (washing for 3-5 minutes)  DO NOT use CHG soap on face, private areas, open wounds, or sores.  Pay special attention to the area where your surgery is being performed.  If you are having back surgery, having someone wash your back for you may be helpful. Wait 2 minutes after CHG soap is applied, then you may rinse off the CHG soap.  Pat dry with a clean towel  Put on clean clothes/pajamas   If you choose to wear lotion, please use ONLY the CHG-compatible lotions on the back of this paper.     Additional instructions for the day of surgery: DO NOT APPLY any lotions, deodorants, cologne, or perfumes.   Put on clean/comfortable clothes.  Brush your teeth.  Ask your nurse before applying any prescription medications to the skin.      CHG Compatible Lotions   Aveeno Moisturizing lotion  Cetaphil Moisturizing Cream  Cetaphil Moisturizing Lotion  Clairol Herbal Essence Moisturizing Lotion, Dry Skin  Clairol Herbal Essence Moisturizing Lotion, Extra Dry Skin  Clairol Herbal Essence Moisturizing Lotion, Normal Skin  Curel Age Defying Therapeutic Moisturizing Lotion with Alpha Hydroxy  Curel Extreme Care Body Lotion  Curel Soothing Hands Moisturizing Hand Lotion  Curel Therapeutic Moisturizing Cream, Fragrance-Free  Curel Therapeutic Moisturizing Lotion, Fragrance-Free  Curel Therapeutic Moisturizing Lotion, Original Formula  Eucerin Daily Replenishing Lotion  Eucerin Dry Skin Therapy Plus Alpha Hydroxy Crme  Eucerin Dry Skin Therapy Plus Alpha Hydroxy Lotion  Eucerin Original Crme  Eucerin Original Lotion  Eucerin Plus Crme Eucerin Plus Lotion  Eucerin TriLipid Replenishing Lotion  Keri Anti-Bacterial Hand Lotion  Keri Deep Conditioning Original Lotion Dry Skin Formula Softly Scented  Keri Deep Conditioning  Original Lotion, Fragrance Free Sensitive Skin Formula  Keri Lotion Fast Absorbing Fragrance Free Sensitive Skin Formula  Keri Lotion Fast Absorbing Softly Scented Dry Skin Formula  Keri Original Lotion  Keri Skin Renewal Lotion Keri Silky Smooth Lotion  Keri Silky Smooth Sensitive Skin Lotion  Nivea Body Creamy Conditioning Oil  Nivea Body Extra Enriched Teacher, adult education Moisturizing Lotion Nivea Crme  Nivea Skin Firming Lotion  NutraDerm 30 Skin Lotion  NutraDerm Skin Lotion  NutraDerm Therapeutic Skin Cream  NutraDerm Therapeutic Skin Lotion  ProShield Protective Hand Cream  Provon moisturizing lotion

## 2022-12-03 NOTE — Progress Notes (Signed)
Anesthesia Review:  PCP: Cardiologist : Chest x-ray : EKG : Echo : Stress test: Cardiac Cath :  Activity level:  Sleep Study/ CPAP : Fasting Blood Sugar :      / Checks Blood Sugar -- times a day:   Blood Thinner/ Instructions /Last Dose: ASA / Instructions/ Last Dose :  

## 2022-12-04 ENCOUNTER — Encounter (HOSPITAL_COMMUNITY): Payer: Medicare Other

## 2022-12-05 ENCOUNTER — Other Ambulatory Visit: Payer: Self-pay

## 2022-12-05 ENCOUNTER — Encounter (HOSPITAL_COMMUNITY)
Admission: RE | Admit: 2022-12-05 | Discharge: 2022-12-05 | Disposition: A | Discharge status: Home / Self Care | Payer: Medicare Other | Source: Ambulatory Visit | Attending: Orthopaedic Surgery | Admitting: Orthopaedic Surgery

## 2022-12-05 ENCOUNTER — Encounter (HOSPITAL_COMMUNITY): Payer: Self-pay

## 2022-12-05 VITALS — BP 137/76 | HR 63 | Temp 98.0°F | Resp 16 | Ht 64.0 in | Wt 160.0 lb

## 2022-12-05 DIAGNOSIS — Z01818 Encounter for other preprocedural examination: Secondary | ICD-10-CM | POA: Insufficient documentation

## 2022-12-05 DIAGNOSIS — M1711 Unilateral primary osteoarthritis, right knee: Secondary | ICD-10-CM | POA: Diagnosis not present

## 2022-12-05 LAB — COMPREHENSIVE METABOLIC PANEL
ALT: 14 U/L (ref 0–44)
AST: 17 U/L (ref 15–41)
Albumin: 3.9 g/dL (ref 3.5–5.0)
Alkaline Phosphatase: 60 U/L (ref 38–126)
Anion gap: 9 (ref 5–15)
BUN: 24 mg/dL — ABNORMAL HIGH (ref 8–23)
CO2: 26 mmol/L (ref 22–32)
Calcium: 9.5 mg/dL (ref 8.9–10.3)
Chloride: 102 mmol/L (ref 98–111)
Creatinine, Ser: 0.77 mg/dL (ref 0.44–1.00)
GFR, Estimated: >60 mL/min (ref >60)
Glucose, Bld: 101 mg/dL — ABNORMAL HIGH (ref 70–99)
Potassium: 3.5 mmol/L (ref 3.5–5.1)
Sodium: 137 mmol/L (ref 135–145)
Total Bilirubin: 1 mg/dL (ref 0.3–1.2)
Total Protein: 7.4 g/dL (ref 6.5–8.1)

## 2022-12-05 LAB — CBC
HCT: 37.4 % (ref 36.0–46.0)
Hemoglobin: 12 g/dL (ref 12.0–15.0)
MCH: 28 pg (ref 26.0–34.0)
MCHC: 32.1 g/dL (ref 30.0–36.0)
MCV: 87.2 fL (ref 80.0–100.0)
Platelets: 236 10*3/uL (ref 150–400)
RBC: 4.29 MIL/uL (ref 3.87–5.11)
RDW: 14.1 % (ref 11.5–15.5)
WBC: 5.6 10*3/uL (ref 4.0–10.5)
nRBC: 0 % (ref 0.0–0.2)

## 2022-12-05 LAB — SURGICAL PCR SCREEN
MRSA, PCR: NEGATIVE
Staphylococcus aureus: NEGATIVE

## 2022-12-06 DIAGNOSIS — H6123 Impacted cerumen, bilateral: Secondary | ICD-10-CM | POA: Diagnosis not present

## 2022-12-12 ENCOUNTER — Telehealth: Payer: Self-pay | Admitting: *Deleted

## 2022-12-12 ENCOUNTER — Other Ambulatory Visit: Payer: Self-pay | Admitting: *Deleted

## 2022-12-12 DIAGNOSIS — M1711 Unilateral primary osteoarthritis, right knee: Secondary | ICD-10-CM

## 2022-12-12 NOTE — Care Plan (Signed)
[facility] RNCM call to patient to discuss her upcoming Right total knee arthroplasty with Dr. Magnus Ivan on 12/14/22. She is an [facility] patient and is agreeable to case management. She plans on returning home and has 2 daughters that will be assisting her after discharge. Anticipate HHPT will be needed. Referral to [facility] after choice provided. She will need a RW as well. Reviewed all post op care instructions. Will continue to follow for needs.

## 2022-12-12 NOTE — Telephone Encounter (Signed)
[facility] pre-op call completed. 

## 2022-12-13 ENCOUNTER — Encounter (HOSPITAL_COMMUNITY): Payer: Self-pay | Admitting: Orthopaedic Surgery

## 2022-12-13 NOTE — Anesthesia Preprocedure Evaluation (Addendum)
Anesthesia Evaluation  Patient identified by MRN, date of birth, ID band Patient awake    Reviewed: Allergy & Precautions, NPO status , Patient's Chart, lab work & pertinent test results, reviewed documented beta blocker date and time   Airway Mallampati: II       Dental no notable dental hx. (+) Teeth Intact, Dental Advisory Given   Pulmonary neg pulmonary ROS   Pulmonary exam normal breath sounds clear to auscultation       Cardiovascular hypertension, Pt. on medications Normal cardiovascular exam Rhythm:Regular Rate:Normal     Neuro/Psych  PSYCHIATRIC DISORDERS  Depression    negative neurological ROS     GI/Hepatic Neg liver ROS,GERD  ,,  Endo/Other  negative endocrine ROS    Renal/GU negative Renal ROS  negative genitourinary   Musculoskeletal  (+) Arthritis , Osteoarthritis,  OA right knee   Abdominal   Peds  Hematology negative hematology ROS (+)   Anesthesia Other Findings   Reproductive/Obstetrics                              Anesthesia Physical Anesthesia Plan  ASA: 2  Anesthesia Plan: Spinal   Post-op Pain Management: Regional block* and Minimal or no pain anticipated   Induction: Intravenous  PONV Risk Score and Plan: 3 and Treatment may vary due to age or medical condition and Propofol infusion  Airway Management Planned: Natural Airway and Simple Face Mask  Additional Equipment: None  Intra-op Plan:   Post-operative Plan: Extubation in OR  Informed Consent: I have reviewed the patients History and Physical, chart, labs and discussed the procedure including the risks, benefits and alternatives for the proposed anesthesia with the patient or authorized representative who has indicated his/her understanding and acceptance.     Dental advisory given  Plan Discussed with: Anesthesiologist and CRNA  Anesthesia Plan Comments:          Anesthesia Quick  Evaluation

## 2022-12-13 NOTE — H&P (Signed)
TOTAL KNEE ADMISSION H&P  Patient is being admitted for right total knee arthroplasty.  Subjective:  Chief Complaint:right knee pain.  HPI: Candace Bell, 72 y.o. female, has a history of pain and functional disability in the right knee due to arthritis and has failed non-surgical conservative treatments for greater than 12 weeks to includeNSAID's and/or analgesics, corticosteriod injections, use of assistive devices, and activity modification.  Onset of symptoms was gradual, starting 4 years ago with gradually worsening course since that time. The patient noted no past surgery on the right knee(s).  Patient currently rates pain in the right knee(s) at 10 out of 10 with activity. Patient has night pain, worsening of pain with activity and weight bearing, pain that interferes with activities of daily living, pain with passive range of motion, crepitus, and joint swelling.  Patient has evidence of subchondral sclerosis, periarticular osteophytes, and joint space narrowing by imaging studies. There is no active infection.  Patient Active Problem List   Diagnosis Date Noted   Preventative health care 07/03/2021   Upper extremity weakness 11/18/2020   Hypertension    Arthritis    Other fatigue 03/07/2020   Loss of appetite 03/07/2020   SOB (shortness of breath) 03/07/2020   Depression, major, single episode, mild (HCC) 03/07/2020   Unilateral primary osteoarthritis, right knee 03/07/2020   Left hand pain 10/21/2018   Hyperlipidemia LDL goal <100 01/26/2018   Memory loss 04/16/2017   Osteopenia 10/13/2015   Back pain 11/25/2013   Essential hypertension 02/29/2012   Past Medical History:  Diagnosis Date   Arthritis    generalized   Cataract 2015   bilateral sx   GERD (gastroesophageal reflux disease)    with certain foods   Hypertension    on meds   Osteopenia 10/13/2015    Past Surgical History:  Procedure Laterality Date   ABDOMINAL HYSTERECTOMY  04/10/1991   TAH --took one  ovary   BACK SURGERY  2002   BUNIONECTOMY WITH HAMMERTOE RECONSTRUCTION Right 2014   CATARACT EXTRACTION Bilateral 04/09/2013   COLONOSCOPY  2015   MS-MAC-movi(exc)-ulcerated polyp   right wrist surgery due to fracture      No current facility-administered medications for this encounter.   Current Outpatient Medications  Medication Sig Dispense Refill Last Dose   acetaminophen (TYLENOL) 500 MG tablet Take 1,000 mg by mouth every 6 (six) hours as needed for moderate pain.      Calcium Carbonate-Vit D-Min (CALCIUM 1200 PO) Take 1 tablet by mouth daily.      Cholecalciferol (VITAMIN D3 PO) Take 1 tablet by mouth daily.      naproxen sodium (ALEVE) 220 MG tablet Take 220 mg by mouth daily as needed (knee pain).      pantoprazole (PROTONIX) 40 MG tablet TAKE 1 TABLET BY MOUTH EVERY DAY (Patient taking differently: Take 40 mg by mouth daily as needed (acid reflux).) 90 tablet 0    valsartan-hydrochlorothiazide (DIOVAN-HCT) 160-25 MG tablet TAKE 1 TABLET BY MOUTH EVERY DAY 90 tablet 0    COVID-19 mRNA vaccine 2023-2024 (COMIRNATY) syringe Inject into the muscle. (Patient not taking: Reported on 11/29/2022) 0.3 mL 0 Not Taking   RSV vaccine recomb adjuvanted (AREXVY) 120 MCG/0.5ML injection Inject into the muscle. (Patient not taking: Reported on 11/29/2022) 0.5 mL 0 Not Taking   Allergies  Allergen Reactions   Amoxicillin Anaphylaxis and Swelling   Other Anaphylaxis    All -cillins     Penicillins Anaphylaxis and Swelling    Social History  Tobacco Use   Smoking status: Never   Smokeless tobacco: Never  Substance Use Topics   Alcohol use: Yes    Comment: Occasional wine monthly    Family History  Problem Relation Age of Onset   Colon polyps Mother    Diabetes Mother    Dementia Father    Colon polyps Sister    Diabetes Brother    Stroke Brother    Esophageal cancer Neg Hx    Rectal cancer Neg Hx    Stomach cancer Neg Hx    Colon cancer Neg Hx      Review of  Systems  Objective:  Physical Exam Constitutional:      Appearance: Normal appearance. She is normal weight.  HENT:     Head: Normocephalic and atraumatic.  Eyes:     Extraocular Movements: Extraocular movements intact.     Pupils: Pupils are equal, round, and reactive to light.  Cardiovascular:     Rate and Rhythm: Normal rate and regular rhythm.  Pulmonary:     Effort: Pulmonary effort is normal.     Breath sounds: Normal breath sounds.  Abdominal:     Palpations: Abdomen is soft.  Musculoskeletal:     Cervical back: Normal range of motion and neck supple.     Right knee: Effusion, bony tenderness and crepitus present. Decreased range of motion. Tenderness present over the medial joint line and lateral joint line. Abnormal alignment.  Neurological:     Mental Status: She is alert and oriented to person, place, and time.  Psychiatric:        Behavior: Behavior normal.     Vital signs in last 24 hours:    Labs:   Estimated body mass index is 27.46 kg/m as calculated from the following:   Height as of 12/05/22: 5\' 4"  (1.626 m).   Weight as of 12/05/22: 72.6 kg.   Imaging Review Plain radiographs demonstrate severe degenerative joint disease of the right knee(s). The overall alignment issignificant valgus. The bone quality appears to be good for age and reported activity level.      Assessment/Plan:  End stage arthritis, right knee   The patient history, physical examination, clinical judgment of the provider and imaging studies are consistent with end stage degenerative joint disease of the right knee(s) and total knee arthroplasty is deemed medically necessary. The treatment options including medical management, injection therapy arthroscopy and arthroplasty were discussed at length. The risks and benefits of total knee arthroplasty were presented and reviewed. The risks due to aseptic loosening, infection, stiffness, patella tracking problems, thromboembolic  complications and other imponderables were discussed. The patient acknowledged the explanation, agreed to proceed with the plan and consent was signed. Patient is being admitted for inpatient treatment for surgery, pain control, PT, OT, prophylactic antibiotics, VTE prophylaxis, progressive ambulation and ADL's and discharge planning. The patient is planning to be discharged home with home health services

## 2022-12-14 ENCOUNTER — Ambulatory Visit (HOSPITAL_COMMUNITY): Payer: Medicare Other | Admitting: Anesthesiology

## 2022-12-14 ENCOUNTER — Encounter (HOSPITAL_COMMUNITY)
Admission: RE | Disposition: A | Discharge status: Home Health | Payer: Self-pay | Source: Ambulatory Visit | Attending: Orthopaedic Surgery

## 2022-12-14 ENCOUNTER — Observation Stay (HOSPITAL_COMMUNITY): Payer: Medicare Other

## 2022-12-14 ENCOUNTER — Ambulatory Visit (HOSPITAL_COMMUNITY): Payer: Medicare Other | Admitting: Physician Assistant

## 2022-12-14 ENCOUNTER — Inpatient Hospital Stay (HOSPITAL_COMMUNITY)
Admission: RE | Admit: 2022-12-14 | Discharge: 2022-12-17 | DRG: 470 | Disposition: A | Discharge status: Home Health | Payer: Medicare Other | Source: Ambulatory Visit | Attending: Orthopaedic Surgery | Admitting: Orthopaedic Surgery

## 2022-12-14 ENCOUNTER — Other Ambulatory Visit: Payer: Self-pay

## 2022-12-14 ENCOUNTER — Encounter (HOSPITAL_COMMUNITY): Payer: Self-pay | Admitting: Orthopaedic Surgery

## 2022-12-14 DIAGNOSIS — E785 Hyperlipidemia, unspecified: Secondary | ICD-10-CM | POA: Diagnosis not present

## 2022-12-14 DIAGNOSIS — Z833 Family history of diabetes mellitus: Secondary | ICD-10-CM

## 2022-12-14 DIAGNOSIS — Z823 Family history of stroke: Secondary | ICD-10-CM | POA: Diagnosis not present

## 2022-12-14 DIAGNOSIS — Z9842 Cataract extraction status, left eye: Secondary | ICD-10-CM | POA: Diagnosis not present

## 2022-12-14 DIAGNOSIS — Z9889 Other specified postprocedural states: Secondary | ICD-10-CM

## 2022-12-14 DIAGNOSIS — Z79899 Other long term (current) drug therapy: Secondary | ICD-10-CM

## 2022-12-14 DIAGNOSIS — Z9841 Cataract extraction status, right eye: Secondary | ICD-10-CM

## 2022-12-14 DIAGNOSIS — Z86010 Personal history of colonic polyps: Secondary | ICD-10-CM

## 2022-12-14 DIAGNOSIS — Z87892 Personal history of anaphylaxis: Secondary | ICD-10-CM

## 2022-12-14 DIAGNOSIS — F320 Major depressive disorder, single episode, mild: Secondary | ICD-10-CM | POA: Diagnosis present

## 2022-12-14 DIAGNOSIS — I10 Essential (primary) hypertension: Secondary | ICD-10-CM | POA: Diagnosis not present

## 2022-12-14 DIAGNOSIS — Z880 Allergy status to penicillin: Secondary | ICD-10-CM

## 2022-12-14 DIAGNOSIS — Z83719 Family history of colon polyps, unspecified: Secondary | ICD-10-CM

## 2022-12-14 DIAGNOSIS — M1711 Unilateral primary osteoarthritis, right knee: Principal | ICD-10-CM

## 2022-12-14 DIAGNOSIS — Z01818 Encounter for other preprocedural examination: Secondary | ICD-10-CM

## 2022-12-14 DIAGNOSIS — K219 Gastro-esophageal reflux disease without esophagitis: Secondary | ICD-10-CM | POA: Diagnosis not present

## 2022-12-14 DIAGNOSIS — Z881 Allergy status to other antibiotic agents status: Secondary | ICD-10-CM

## 2022-12-14 DIAGNOSIS — M8580 Other specified disorders of bone density and structure, unspecified site: Secondary | ICD-10-CM | POA: Diagnosis not present

## 2022-12-14 DIAGNOSIS — Z7982 Long term (current) use of aspirin: Secondary | ICD-10-CM | POA: Diagnosis not present

## 2022-12-14 DIAGNOSIS — G8918 Other acute postprocedural pain: Secondary | ICD-10-CM | POA: Diagnosis not present

## 2022-12-14 DIAGNOSIS — Z96651 Presence of right artificial knee joint: Secondary | ICD-10-CM | POA: Diagnosis not present

## 2022-12-14 DIAGNOSIS — Z90710 Acquired absence of both cervix and uterus: Secondary | ICD-10-CM

## 2022-12-14 DIAGNOSIS — Z471 Aftercare following joint replacement surgery: Secondary | ICD-10-CM | POA: Diagnosis not present

## 2022-12-14 DIAGNOSIS — Z820 Family history of epilepsy and other diseases of the nervous system: Secondary | ICD-10-CM

## 2022-12-14 DIAGNOSIS — R609 Edema, unspecified: Secondary | ICD-10-CM | POA: Diagnosis not present

## 2022-12-14 HISTORY — PX: TOTAL KNEE ARTHROPLASTY: SHX125

## 2022-12-14 SURGERY — ARTHROPLASTY, KNEE, TOTAL
Anesthesia: Spinal | Site: Knee | Laterality: Right

## 2022-12-14 MED ORDER — METOCLOPRAMIDE HCL 5 MG PO TABS: 5.0000 mg | ORAL_TABLET | Freq: Three times a day (TID) | ORAL | Status: DC | PRN

## 2022-12-14 MED ORDER — PHENOL 1.4 % MT LIQD: 1.0000 | OROMUCOSAL | Status: DC | PRN

## 2022-12-14 MED ORDER — METOCLOPRAMIDE HCL 5 MG/ML IJ SOLN: 5.0000 mg | Freq: Three times a day (TID) | INTRAMUSCULAR | Status: DC | PRN

## 2022-12-14 MED ORDER — ALUM & MAG HYDROXIDE-SIMETH 200-200-20 MG/5ML PO SUSP: 30.0000 mL | ORAL | Status: DC | PRN

## 2022-12-14 MED ORDER — VALSARTAN-HYDROCHLOROTHIAZIDE 160-25 MG PO TABS: 1.0000 | ORAL_TABLET | Freq: Every day | ORAL | Status: DC

## 2022-12-14 MED ORDER — SODIUM CHLORIDE 0.9 % IV SOLN: INTRAVENOUS | Status: DC

## 2022-12-14 MED ORDER — ACETAMINOPHEN 325 MG PO TABS
325.0000 mg | ORAL_TABLET | Freq: Four times a day (QID) | ORAL | Status: DC | PRN
  Administered 2022-12-14 – 2022-12-17 (×4): 650 mg via ORAL
  Filled 2022-12-14 (×4): qty 2

## 2022-12-14 MED ORDER — ONDANSETRON HCL 4 MG/2ML IJ SOLN
INTRAMUSCULAR | Status: AC
  Filled 2022-12-14: qty 4

## 2022-12-14 MED ORDER — LIDOCAINE 2% (20 MG/ML) 5 ML SYRINGE
INTRAMUSCULAR | Status: DC | PRN
  Administered 2022-12-14: 50 mg via INTRAVENOUS

## 2022-12-14 MED ORDER — ONDANSETRON HCL 4 MG/2ML IJ SOLN: 4.0000 mg | Freq: Once | INTRAMUSCULAR | Status: DC | PRN

## 2022-12-14 MED ORDER — LACTATED RINGERS IV SOLN: INTRAVENOUS | Status: DC

## 2022-12-14 MED ORDER — DIPHENHYDRAMINE HCL 12.5 MG/5ML PO ELIX: 12.5000 mg | ORAL_SOLUTION | ORAL | Status: DC | PRN

## 2022-12-14 MED ORDER — DOCUSATE SODIUM 100 MG PO CAPS
100.0000 mg | ORAL_CAPSULE | Freq: Two times a day (BID) | ORAL | Status: DC
  Administered 2022-12-14 – 2022-12-17 (×6): 100 mg via ORAL
  Filled 2022-12-14 (×6): qty 1

## 2022-12-14 MED ORDER — BUPIVACAINE IN DEXTROSE 0.75-8.25 % IT SOLN
INTRATHECAL | Status: DC | PRN
Start: 2022-12-14 — End: 2022-12-14
  Administered 2022-12-14: 1.8 mL via INTRATHECAL

## 2022-12-14 MED ORDER — ONDANSETRON HCL 4 MG PO TABS
4.0000 mg | ORAL_TABLET | Freq: Four times a day (QID) | ORAL | Status: DC | PRN
  Administered 2022-12-15: 4 mg via ORAL
  Filled 2022-12-14: qty 1

## 2022-12-14 MED ORDER — CEFAZOLIN SODIUM-DEXTROSE 1-4 GM/50ML-% IV SOLN
1.0000 g | Freq: Four times a day (QID) | INTRAVENOUS | Status: AC
  Administered 2022-12-14 (×2): 1 g via INTRAVENOUS
  Filled 2022-12-14 (×2): qty 50

## 2022-12-14 MED ORDER — BUPIVACAINE-EPINEPHRINE 0.25% -1:200000 IJ SOLN
INTRAMUSCULAR | Status: AC
  Filled 2022-12-14: qty 1

## 2022-12-14 MED ORDER — 0.9 % SODIUM CHLORIDE (POUR BTL) OPTIME
TOPICAL | Status: DC | PRN
  Administered 2022-12-14: 1000 mL

## 2022-12-14 MED ORDER — POVIDONE-IODINE 10 % EX SWAB: 2.0000 | Freq: Once | CUTANEOUS | Status: DC

## 2022-12-14 MED ORDER — PHENYLEPHRINE 80 MCG/ML (10ML) SYRINGE FOR IV PUSH (FOR BLOOD PRESSURE SUPPORT)
PREFILLED_SYRINGE | INTRAVENOUS | Status: DC | PRN
  Administered 2022-12-14 (×4): 80 ug via INTRAVENOUS
  Administered 2022-12-14: 120 ug via INTRAVENOUS
  Administered 2022-12-14 (×2): 80 ug via INTRAVENOUS

## 2022-12-14 MED ORDER — ORAL CARE MOUTH RINSE: 15.0000 mL | Freq: Once | OROMUCOSAL | Status: AC

## 2022-12-14 MED ORDER — OXYCODONE HCL 5 MG PO TABS: 5.0000 mg | ORAL_TABLET | Freq: Once | ORAL | Status: DC | PRN

## 2022-12-14 MED ORDER — EPHEDRINE 5 MG/ML INJ
INTRAVENOUS | Status: AC
  Filled 2022-12-14: qty 5

## 2022-12-14 MED ORDER — HYDROCHLOROTHIAZIDE 25 MG PO TABS
25.0000 mg | ORAL_TABLET | Freq: Every day | ORAL | Status: DC
  Administered 2022-12-15 – 2022-12-17 (×3): 25 mg via ORAL
  Filled 2022-12-14 (×3): qty 1

## 2022-12-14 MED ORDER — PROPOFOL 10 MG/ML IV BOLUS
INTRAVENOUS | Status: AC
  Filled 2022-12-14: qty 20

## 2022-12-14 MED ORDER — OXYCODONE HCL 5 MG PO TABS
5.0000 mg | ORAL_TABLET | ORAL | Status: DC | PRN
  Administered 2022-12-14: 5 mg via ORAL
  Administered 2022-12-14: 10 mg via ORAL
  Administered 2022-12-15 (×2): 5 mg via ORAL
  Filled 2022-12-14: qty 2
  Filled 2022-12-14: qty 1
  Filled 2022-12-14: qty 2
  Filled 2022-12-14 (×2): qty 1

## 2022-12-14 MED ORDER — SODIUM CHLORIDE 0.9 % IR SOLN
Status: DC | PRN
  Administered 2022-12-14: 1000 mL

## 2022-12-14 MED ORDER — METHOCARBAMOL 500 MG IVPB - SIMPLE MED: 500.0000 mg | Freq: Four times a day (QID) | INTRAVENOUS | Status: DC | PRN

## 2022-12-14 MED ORDER — PHENYLEPHRINE 80 MCG/ML (10ML) SYRINGE FOR IV PUSH (FOR BLOOD PRESSURE SUPPORT)
PREFILLED_SYRINGE | INTRAVENOUS | Status: AC
  Filled 2022-12-14: qty 10

## 2022-12-14 MED ORDER — PROPOFOL 500 MG/50ML IV EMUL
INTRAVENOUS | Status: DC | PRN
  Administered 2022-12-14: 75 ug/kg/min via INTRAVENOUS

## 2022-12-14 MED ORDER — MENTHOL 3 MG MT LOZG: 1.0000 | LOZENGE | OROMUCOSAL | Status: DC | PRN

## 2022-12-14 MED ORDER — DEXAMETHASONE SODIUM PHOSPHATE 10 MG/ML IJ SOLN
INTRAMUSCULAR | Status: AC
  Filled 2022-12-14: qty 2

## 2022-12-14 MED ORDER — METHOCARBAMOL 500 MG PO TABS
500.0000 mg | ORAL_TABLET | Freq: Four times a day (QID) | ORAL | Status: DC | PRN
  Administered 2022-12-14 – 2022-12-16 (×4): 500 mg via ORAL
  Filled 2022-12-14 (×4): qty 1

## 2022-12-14 MED ORDER — CEFAZOLIN SODIUM-DEXTROSE 2-4 GM/100ML-% IV SOLN
2.0000 g | INTRAVENOUS | Status: AC
  Administered 2022-12-14: 2 g via INTRAVENOUS
  Filled 2022-12-14: qty 100

## 2022-12-14 MED ORDER — IRBESARTAN 150 MG PO TABS
150.0000 mg | ORAL_TABLET | Freq: Every day | ORAL | Status: DC
  Administered 2022-12-15 – 2022-12-17 (×3): 150 mg via ORAL
  Filled 2022-12-14 (×3): qty 1

## 2022-12-14 MED ORDER — OXYCODONE HCL 5 MG PO TABS
10.0000 mg | ORAL_TABLET | ORAL | Status: DC | PRN
  Administered 2022-12-14 – 2022-12-16 (×4): 10 mg via ORAL
  Filled 2022-12-14 (×3): qty 2

## 2022-12-14 MED ORDER — PROPOFOL 10 MG/ML IV BOLUS
INTRAVENOUS | Status: DC | PRN
Start: 2022-12-14 — End: 2022-12-14
  Administered 2022-12-14: 10 mg via INTRAVENOUS

## 2022-12-14 MED ORDER — STERILE WATER FOR IRRIGATION IR SOLN
Status: DC | PRN
Start: 2022-12-14 — End: 2022-12-14
  Administered 2022-12-14: 2000 mL

## 2022-12-14 MED ORDER — ASPIRIN 81 MG PO CHEW
81.0000 mg | CHEWABLE_TABLET | Freq: Two times a day (BID) | ORAL | Status: DC
  Administered 2022-12-14 – 2022-12-17 (×6): 81 mg via ORAL
  Filled 2022-12-14 (×6): qty 1

## 2022-12-14 MED ORDER — LIDOCAINE HCL (PF) 2 % IJ SOLN
INTRAMUSCULAR | Status: AC
  Filled 2022-12-14: qty 10

## 2022-12-14 MED ORDER — HYDROMORPHONE HCL 1 MG/ML IJ SOLN: 0.2500 mg | INTRAMUSCULAR | Status: DC | PRN

## 2022-12-14 MED ORDER — PROPOFOL 1000 MG/100ML IV EMUL
INTRAVENOUS | Status: AC
  Filled 2022-12-14: qty 200

## 2022-12-14 MED ORDER — CHLORHEXIDINE GLUCONATE 0.12 % MT SOLN
15.0000 mL | Freq: Once | OROMUCOSAL | Status: AC
  Administered 2022-12-14: 15 mL via OROMUCOSAL

## 2022-12-14 MED ORDER — TRANEXAMIC ACID-NACL 1000-0.7 MG/100ML-% IV SOLN
1000.0000 mg | INTRAVENOUS | Status: AC
  Administered 2022-12-14: 1000 mg via INTRAVENOUS
  Filled 2022-12-14: qty 100

## 2022-12-14 MED ORDER — DEXAMETHASONE SODIUM PHOSPHATE 10 MG/ML IJ SOLN
INTRAMUSCULAR | Status: DC | PRN
  Administered 2022-12-14: 4 mg via INTRAVENOUS

## 2022-12-14 MED ORDER — EPHEDRINE SULFATE-NACL 50-0.9 MG/10ML-% IV SOSY
PREFILLED_SYRINGE | INTRAVENOUS | Status: DC | PRN
  Administered 2022-12-14 (×3): 5 mg via INTRAVENOUS

## 2022-12-14 MED ORDER — ONDANSETRON HCL 4 MG/2ML IJ SOLN
INTRAMUSCULAR | Status: DC | PRN
  Administered 2022-12-14: 4 mg via INTRAVENOUS

## 2022-12-14 MED ORDER — ONDANSETRON HCL 4 MG/2ML IJ SOLN
4.0000 mg | Freq: Four times a day (QID) | INTRAMUSCULAR | Status: DC | PRN
  Administered 2022-12-15: 4 mg via INTRAVENOUS
  Filled 2022-12-14: qty 2

## 2022-12-14 MED ORDER — OXYCODONE HCL 5 MG/5ML PO SOLN: 5.0000 mg | Freq: Once | ORAL | Status: DC | PRN

## 2022-12-14 MED ORDER — MIDAZOLAM HCL 2 MG/2ML IJ SOLN
2.0000 mg | Freq: Once | INTRAMUSCULAR | Status: AC
  Administered 2022-12-14: 1 mg via INTRAVENOUS
  Filled 2022-12-14: qty 2

## 2022-12-14 MED ORDER — BUPIVACAINE-EPINEPHRINE 0.25% -1:200000 IJ SOLN
INTRAMUSCULAR | Status: DC | PRN
  Administered 2022-12-14: 30 mL

## 2022-12-14 MED ORDER — ROPIVACAINE HCL 5 MG/ML IJ SOLN
INTRAMUSCULAR | Status: DC | PRN
  Administered 2022-12-14: 30 mL via PERINEURAL

## 2022-12-14 MED ORDER — FENTANYL CITRATE PF 50 MCG/ML IJ SOSY
100.0000 ug | PREFILLED_SYRINGE | Freq: Once | INTRAMUSCULAR | Status: AC
  Administered 2022-12-14: 50 ug via INTRAVENOUS
  Filled 2022-12-14: qty 2

## 2022-12-14 MED ORDER — PANTOPRAZOLE SODIUM 40 MG PO TBEC
40.0000 mg | DELAYED_RELEASE_TABLET | Freq: Every day | ORAL | Status: DC
  Administered 2022-12-15 – 2022-12-17 (×3): 40 mg via ORAL
  Filled 2022-12-14 (×3): qty 1

## 2022-12-14 MED ORDER — HYDROMORPHONE HCL 1 MG/ML IJ SOLN
0.5000 mg | INTRAMUSCULAR | Status: DC | PRN
  Administered 2022-12-15 (×3): 1 mg via INTRAVENOUS
  Filled 2022-12-14 (×3): qty 1

## 2022-12-14 SURGICAL SUPPLY — 65 items
APL SKNCLS STERI-STRIP NONHPOA (GAUZE/BANDAGES/DRESSINGS)
BAG COUNTER SPONGE SURGICOUNT (BAG) IMPLANT
BAG SPEC THK2 15X12 ZIP CLS (MISCELLANEOUS) ×1
BAG SPNG CNTER NS LX DISP (BAG)
BAG ZIPLOCK 12X15 (MISCELLANEOUS) ×1 IMPLANT
BENZOIN TINCTURE PRP APPL 2/3 (GAUZE/BANDAGES/DRESSINGS) IMPLANT
BLADE SAG 18X100X1.27 (BLADE) ×1 IMPLANT
BLADE SAW SGTL 13.0X1.19X90.0M (BLADE) IMPLANT
BLADE SURG SZ10 CARB STEEL (BLADE) ×2 IMPLANT
BNDG CMPR 6 X 5 YARDS HK CLSR (GAUZE/BANDAGES/DRESSINGS) ×1
BNDG ELASTIC 6INX 5YD STR LF (GAUZE/BANDAGES/DRESSINGS) ×2 IMPLANT
BOWL SMART MIX CTS (DISPOSABLE) IMPLANT
BSPLAT TIB 5D E CMNT KN RT (Knees) ×1 IMPLANT
CEMENT BONE R 1X40 (Cement) IMPLANT
CEMENT BONE SIMPLEX SPEEDSET (Cement) IMPLANT
COOLER ICEMAN CLASSIC (MISCELLANEOUS) ×1 IMPLANT
COVER SURGICAL LIGHT HANDLE (MISCELLANEOUS) ×1 IMPLANT
CUFF TOURN SGL QUICK 34 (TOURNIQUET CUFF) ×1
CUFF TRNQT CYL 34X4.125X (TOURNIQUET CUFF) ×1 IMPLANT
DRAPE INCISE IOBAN 66X45 STRL (DRAPES) ×1 IMPLANT
DRAPE U-SHAPE 47X51 STRL (DRAPES) ×1 IMPLANT
DURAPREP 26ML APPLICATOR (WOUND CARE) ×1 IMPLANT
ELECT BLADE TIP CTD 4 INCH (ELECTRODE) ×1 IMPLANT
ELECT REM PT RETURN 15FT ADLT (MISCELLANEOUS) ×1 IMPLANT
FEMUR CMT CCR STD SZ8 R KNEE (Knees) ×1 IMPLANT
FEMUR CMTD CCR STD SZ8 R KNEE (Knees) IMPLANT
GAUZE PAD ABD 8X10 STRL (GAUZE/BANDAGES/DRESSINGS) ×2 IMPLANT
GAUZE SPONGE 4X4 12PLY STRL (GAUZE/BANDAGES/DRESSINGS) ×1 IMPLANT
GAUZE XEROFORM 1X8 LF (GAUZE/BANDAGES/DRESSINGS) IMPLANT
GLOVE BIO SURGEON STRL SZ7.5 (GLOVE) ×1 IMPLANT
GLOVE BIOGEL PI IND STRL 8 (GLOVE) ×2 IMPLANT
GLOVE ECLIPSE 8.0 STRL XLNG CF (GLOVE) ×1 IMPLANT
GOWN STRL REUS W/ TWL XL LVL3 (GOWN DISPOSABLE) ×2 IMPLANT
GOWN STRL REUS W/TWL XL LVL3 (GOWN DISPOSABLE) ×2
GUIDE TASP BOTTOM PS EF +0 RT (ORTHOPEDIC DISPOSABLE SUPPLIES) IMPLANT
HANDPIECE INTERPULSE COAX TIP (DISPOSABLE) ×1
HOLDER FOLEY CATH W/STRAP (MISCELLANEOUS) IMPLANT
IMMOBILIZER KNEE 20 (SOFTGOODS) ×1
IMMOBILIZER KNEE 20 THIGH 36 (SOFTGOODS) ×1 IMPLANT
INSERT TIB ASF PSN 6-9X12 RT (Insert) IMPLANT
KIT TURNOVER KIT A (KITS) IMPLANT
NS IRRIG 1000ML POUR BTL (IV SOLUTION) ×1 IMPLANT
PACK TOTAL KNEE CUSTOM (KITS) ×1 IMPLANT
PAD COLD SHLDR WRAP-ON (PAD) ×1 IMPLANT
PADDING CAST COTTON 6X4 STRL (CAST SUPPLIES) ×2 IMPLANT
PIN DRILL HDLS TROCAR 75 4PK (PIN) IMPLANT
PROTECTOR NERVE ULNAR (MISCELLANEOUS) ×1 IMPLANT
SCREW FEMALE HEX FIX 25X2.5 (ORTHOPEDIC DISPOSABLE SUPPLIES) IMPLANT
SET HNDPC FAN SPRY TIP SCT (DISPOSABLE) ×1 IMPLANT
SET PAD KNEE POSITIONER (MISCELLANEOUS) ×1 IMPLANT
SPIKE FLUID TRANSFER (MISCELLANEOUS) IMPLANT
STAPLER VISISTAT 35W (STAPLE) IMPLANT
STEM POLY PAT PLY 32M KNEE (Knees) IMPLANT
STEM TIBIA 5 DEG SZ E R KNEE (Knees) IMPLANT
STRIP CLOSURE SKIN 1/2X4 (GAUZE/BANDAGES/DRESSINGS) IMPLANT
SUT MNCRL AB 4-0 PS2 18 (SUTURE) IMPLANT
SUT VIC AB 0 CT1 27 (SUTURE) ×1
SUT VIC AB 0 CT1 27XBRD ANTBC (SUTURE) ×1 IMPLANT
SUT VIC AB 1 CT1 36 (SUTURE) ×2 IMPLANT
SUT VIC AB 2-0 CT1 27 (SUTURE) ×2
SUT VIC AB 2-0 CT1 TAPERPNT 27 (SUTURE) ×2 IMPLANT
TIBIA STEM 5 DEG SZ E R KNEE (Knees) ×1 IMPLANT
TRAY FOLEY MTR SLVR 14FR STAT (SET/KITS/TRAYS/PACK) IMPLANT
TRAY FOLEY MTR SLVR 16FR STAT (SET/KITS/TRAYS/PACK) IMPLANT
WATER STERILE IRR 1000ML POUR (IV SOLUTION) ×2 IMPLANT

## 2022-12-14 NOTE — Plan of Care (Signed)
  Problem: Education: Goal: Knowledge of the prescribed therapeutic regimen will improve Outcome: Progressing   Problem: Activity: Goal: Ability to avoid complications of mobility impairment will improve Outcome: Progressing   Problem: Pain Management: Goal: Pain level will decrease with appropriate interventions 12/14/2022 1303 by Marni Griffon I, RN Outcome: Progressing 12/14/2022 1302 by Koren Shiver, RN Outcome: Progressing   Problem: Education: Goal: Knowledge of General Education information will improve Description: Including pain rating scale, medication(s)/side effects and non-pharmacologic comfort measures Outcome: Progressing   Problem: Activity: Goal: Risk for activity intolerance will decrease Outcome: Progressing   Problem: Nutrition: Goal: Adequate nutrition will be maintained Outcome: Progressing   Problem: Elimination: Goal: Will not experience complications related to bowel motility Outcome: Progressing   Problem: Pain Managment: Goal: General experience of comfort will improve Outcome: Progressing

## 2022-12-14 NOTE — Evaluation (Signed)
Physical Therapy Evaluation Patient Details Name: Candace Bell MRN: [id number] DOB: 05-10-1950 Today's Date: 12/14/2022  History of Present Illness  72 yo female presents to therapy s/p R TKA on 12/14/2022 due to failure of conservative measures. Pt PMH includes but is not limited to: HTN, SOB, HLD, back pain, and GERD.  Clinical Impression     Candace Bell is a 72 y.o. female POD 0 s/p R TKA. Patient reports IND with mobility at baseline. Patient is now limited by functional impairments (see PT problem list below) and requires min A for bed mobility and mod A for transfers. Patient was unable to safely ambulate at time of eval due to R LE instability attributed to slow regression of anesthesia.  Patient will benefit from continued skilled PT interventions to address impairments and progress towards PLOF. Acute PT will follow to progress mobility and stair training in preparation for safe discharge home with family support and [facility] services.        If plan is discharge home, recommend the following: A lot of help with walking and/or transfers;A little help with bathing/dressing/bathroom;Assistance with cooking/housework;Assist for transportation;Help with stairs or ramp for entrance   Can travel by private vehicle        Equipment Recommendations Rolling walker (2 wheels)  Recommendations for Other Services       Functional Status Assessment Patient has had a recent decline in their functional status and demonstrates the ability to make significant improvements in function in a reasonable and predictable amount of time.     Precautions / Restrictions Precautions Precautions: Knee;Fall Restrictions Weight Bearing Restrictions: No      Mobility  Bed Mobility Overal bed mobility: Needs Assistance Bed Mobility: Supine to Sit     Supine to sit: Min assist, HOB elevated, Used rails     General bed mobility comments: min A for R LE to EOB, cues for technique     Transfers Overall transfer level: Needs assistance Equipment used: Rolling walker (2 wheels) Transfers: Sit to/from Stand, Bed to chair/wheelchair/BSC Sit to Stand: Mod assist, From elevated surface Stand pivot transfers: Mod assist         General transfer comment: pt required mod A due to R LE instabilty attributed to slow regression of anesthesia with minimal quad engagement limiting R LE weight accptance, cues for technique, sequencing and LE placement    Ambulation/Gait               General Gait Details: NT due to R LE instability for pt and staff safety  Stairs            Wheelchair Mobility     Tilt Bed    Modified Rankin (Stroke Patients Only)       Balance Overall balance assessment: Needs assistance Sitting-balance support: Feet supported Sitting balance-Leahy Scale: Good     Standing balance support: Bilateral upper extremity supported, During functional activity, Reliant on assistive device for balance Standing balance-Leahy Scale: Poor                               Pertinent Vitals/Pain Pain Assessment Pain Assessment: 0-10 Pain Score: 8  Pain Location: R LE/knee Pain Descriptors / Indicators: Aching, Constant, Discomfort, Grimacing, Operative site guarding, Restless Pain Intervention(s): Limited activity within patient's tolerance, Monitored during session, Premedicated before session, Repositioned, Ice applied (pain medication provided ~ 15-20 mins prior to PT eval)    Home Living Family/patient  expects to be discharged to:: Private residence (town home) Living Arrangements: Children Available Help at Discharge: Family Type of Home: House Home Access: Level entry       Home Layout: Two level;Able to live on main level with bedroom/bathroom Home Equipment: None Additional Comments: personal RW and ice manmachine present at eval    Prior Function Prior Level of Function : Independent/Modified Independent;Driving              Mobility Comments: no AD IND with all ADLs, self care tasks, IADLs and attending water exercise classess at Y       Extremity/Trunk Assessment        Lower Extremity Assessment Lower Extremity Assessment: RLE deficits/detail RLE Deficits / Details: ankle DF 3+/5, PF 4/5 and AA for SLR RLE Sensation: WNL    Cervical / Trunk Assessment Cervical / Trunk Assessment:  (wfl)  Communication   Communication Communication: No apparent difficulties  Cognition Arousal: Alert Behavior During Therapy: WFL for tasks assessed/performed Overall Cognitive Status: Within Functional Limits for tasks assessed                                          General Comments      Exercises     Assessment/Plan    PT Assessment Patient needs continued PT services  PT Problem List Decreased strength;Decreased range of motion;Decreased activity tolerance;Decreased balance;Decreased mobility;Decreased coordination;Pain       PT Treatment Interventions DME instruction;Gait training;Functional mobility training;Therapeutic activities;Therapeutic exercise;Balance training;Neuromuscular re-education;Patient/family education;Canalith reposition    PT Goals (Current goals can be found in the Care Plan section)  Acute Rehab PT Goals Patient Stated Goal: to be able to go home tomorrow, dance again and return to water arobics PT Goal Formulation: With patient Time For Goal Achievement: 12/28/22 Potential to Achieve Goals: Good    Frequency 7X/week     Co-evaluation               AM-PAC PT "6 Clicks" Mobility  Outcome Measure Help needed turning from your back to your side while in a flat bed without using bedrails?: A Little Help needed moving from lying on your back to sitting on the side of a flat bed without using bedrails?: A Little Help needed moving to and from a bed to a chair (including a wheelchair)?: A Lot Help needed standing up from a chair using  your arms (e.g., wheelchair or bedside chair)?: A Lot Help needed to walk in hospital room?: Total Help needed climbing 3-5 steps with a railing? : Total 6 Click Score: 12    End of Session Equipment Utilized During Treatment: Gait belt Activity Tolerance: Patient limited by pain;Treatment limited secondary to medical complications (Comment) (R LE instabilty) Patient left: in chair;with call bell/phone within reach;with chair alarm set;with family/visitor present Nurse Communication: Mobility status PT Visit Diagnosis: Unsteadiness on feet (R26.81);Other abnormalities of gait and mobility (R26.89);Difficulty in walking, not elsewhere classified (R26.2);Pain Pain - Right/Left: Right Pain - part of body: Knee;Leg    Time: 1610-9604 PT Time Calculation (min) (ACUTE ONLY): 27 min   Charges:   PT Evaluation $PT Eval Low Complexity: 1 Low PT Treatments $Therapeutic Activity: 8-22 mins PT General Charges $$ ACUTE PT VISIT: 1 Visit         Johnny Bridge, PT Acute Rehab   Jacqualyn Posey 12/14/2022, 5:27 PM

## 2022-12-14 NOTE — Interval H&P Note (Signed)
History and Physical Interval Note: The patient understands that she is here today for a right knee replacement to treat her severe right knee arthritis.  There has been no acute or interval change in her medical status.  The risks and benefits of surgery have been discussed in detail and informed consent has been obtained.  The right operative knee has been marked.  12/14/2022 6:57 AM  Candace Bell  has presented today for surgery, with the diagnosis of osteoarthritis right knee.  The various methods of treatment have been discussed with the patient and family. After consideration of risks, benefits and other options for treatment, the patient has consented to  Procedure(s): RIGHT TOTAL KNEE ARTHROPLASTY (Right) as a surgical intervention.  The patient's history has been reviewed, patient examined, no change in status, stable for surgery.  I have reviewed the patient's chart and labs.  Questions were answered to the patient's satisfaction.     Kathryne Hitch

## 2022-12-14 NOTE — Anesthesia Postprocedure Evaluation (Signed)
Anesthesia Post Note  Patient: Candace Bell  Procedure(s) Performed: RIGHT TOTAL KNEE ARTHROPLASTY (Right: Knee)     Patient location during evaluation: PACU Anesthesia Type: Spinal Level of consciousness: oriented and awake and alert Pain management: pain level controlled Vital Signs Assessment: post-procedure vital signs reviewed and stable Respiratory status: spontaneous breathing, respiratory function stable and nonlabored ventilation Cardiovascular status: blood pressure returned to baseline and stable Postop Assessment: no headache, no backache, no apparent nausea or vomiting and spinal receding Anesthetic complications: no   No notable events documented.  Last Vitals:  Vitals:   12/14/22 1100 12/14/22 1115  BP: 112/75 116/76  Pulse: 82 79  Resp: 16 19  Temp:    SpO2: 98% 93%    Last Pain:  Vitals:   12/14/22 1115  TempSrc:   PainSc: 0-No pain    LLE Motor Response: Purposeful movement (12/14/22 1115) LLE Sensation: Decreased;Numbness (12/14/22 1115) RLE Motor Response: Purposeful movement (12/14/22 1115) RLE Sensation: Decreased;Numbness (12/14/22 1115) L Sensory Level: S1-Sole of foot, small toes (12/14/22 1115) R Sensory Level: L2-Upper inner thigh, upper buttock (12/14/22 1115)  Ikaika Showers A.

## 2022-12-14 NOTE — Anesthesia Procedure Notes (Signed)
Anesthesia Regional Block: Adductor canal block   Pre-Anesthetic Checklist: , timeout performed,  Correct Patient, Correct Site, Correct Laterality,  Correct Procedure, Correct Position,  Laterality: Right  Prep: chloraprep       Needles:  Injection technique: Single-shot  Needle Type: Echogenic Stimulator Needle     Needle Length: 10cm  Needle Gauge: 21   Needle insertion depth: 7 cm   Additional Needles:   Procedures:,,,, ultrasound used (permanent image in chart),,    Narrative:  Start time: 12/14/2022 8:14 AM End time: 12/14/2022 8:19 AM Injection made incrementally with aspirations every 5 mL.  Performed by: Personally  Anesthesiologist: Mal Amabile, MD  Additional Notes: Timeout performed. Patient sedated. Relevant anatomy ID'd using [place]. Incremental 2-46ml injection of LA with frequent aspiration. Patient tolerated procedure well.

## 2022-12-14 NOTE — TOC Transition Note (Signed)
Transition of Care [facility]) - CM/SW Discharge Note  Patient Details  Name: Candace Bell MRN: [id number] Date of Birth: 01-Sep-1950  [facility] CM/SW Contact:  Ewing Schlein, LCSW Phone Number: 12/14/2022, 3:03 PM  Clinical Narrative: Patient is expected to discharge home after passing PT. CSW met with patient and daughter to review discharge plan and needs. Patient will go home with HHPT through [facility], which was prearranged in orthopedist's office. Patient will need a rolling walker. CSW made DME referral to [facility] with [employer]. [employer] delivered rolling walker to room. TOC signing off.  Final next level of care: Home w Home Health Services Barriers to Discharge: No Barriers Identified  Patient Goals and CMS Choice CMS Medicare.gov Compare Post Acute Care list provided to:: Patient Choice offered to / list presented to : Patient  Discharge Plan and Services Additional resources added to the After Visit Summary for         DME Arranged: Walker rolling DME Agency: [employer] Date DME Agency Contacted: 12/14/22 Time DME Agency Contacted: 1435 Representative spoke with at DME Agency: Yvonna Alanis HH Arranged: PT HH Agency: [facility] Representative spoke with at [facility] Agency: Prearranged in orthopedist's office  Social Determinants of Health (SDOH) Interventions SDOH Screenings   Food Insecurity: No Food Insecurity (12/14/2022)  Housing: Low Risk  (12/14/2022)  Transportation Needs: No Transportation Needs (12/14/2022)  Utilities: Not At Risk (12/14/2022)  Alcohol Screen: Low Risk  (12/14/2020)  Depression (PHQ2-9): Low Risk  (12/18/2021)  Financial Resource Strain: Low Risk  (12/14/2020)  Physical Activity: Insufficiently Active (12/14/2020)  Social Connections: Moderately Isolated (12/14/2020)  Stress: No Stress Concern Present (12/14/2020)  Tobacco Use: Low Risk  (12/14/2022)   Readmission Risk Interventions     No data to display

## 2022-12-14 NOTE — Anesthesia Procedure Notes (Signed)
Spinal  Patient location during procedure: OR Start time: 12/14/2022 8:34 AM End time: 12/14/2022 8:39 AM Reason for block: surgical anesthesia Staffing Performed by: Mal Amabile, MD Authorized by: Mal Amabile, MD   Preanesthetic Checklist Completed: patient identified, IV checked, site marked, risks and benefits discussed, surgical consent, monitors and equipment checked, pre-op evaluation and timeout performed Spinal Block Patient position: sitting Prep: DuraPrep and site prepped and draped Patient monitoring: heart rate, cardiac monitor, continuous pulse ox and blood pressure Approach: midline Location: L3-4 Injection technique: single-shot Needle Needle type: Spinocan and Tuohy  Needle gauge: 25 G Needle length: 9 cm Needle insertion depth: 6 cm Assessment Sensory level: T6 Events: CSF return Additional Notes Patient tolerated procedure well. Adequate sensory level. Technically difficult due to poor positioning. Attempt x 3

## 2022-12-14 NOTE — Transfer of Care (Signed)
Immediate Anesthesia Transfer of Care Note  Patient: Candace Bell  Procedure(s) Performed: RIGHT TOTAL KNEE ARTHROPLASTY (Right: Knee)  Patient Location: PACU  Anesthesia Type:Spinal  Level of Consciousness: drowsy and patient cooperative  Airway & Oxygen Therapy: Patient Spontanous Breathing and Patient connected to face mask oxygen  Post-op Assessment: Report given to RN and Post -op Vital signs reviewed and stable  Post vital signs: Reviewed and stable  Last Vitals:  Vitals Value Taken Time  BP    Temp    Pulse 92 12/14/22 1049  Resp 14 12/14/22 1049  SpO2 100 % 12/14/22 1049  Vitals shown include unfiled device data.  Last Pain:  Vitals:   12/14/22 0825  TempSrc:   PainSc: 0-No pain         Complications: No notable events documented.

## 2022-12-14 NOTE — Op Note (Signed)
Operative Note  Date of operation: 12/14/2022 Preoperative diagnosis: Right knee primary osteoarthritis Postoperative diagnosis: Same  Procedure: Right cemented total knee arthroplasty  Implants: Biomet/Zimmer persona PS cemented knee system Implant Name Type Inv. Item Serial No. Manufacturer Lot No. LRB No. Used Action  CEMENT BONE R 1X40 - [id number] Cement CEMENT BONE R 1X40  ZIMMER RECON(ORTH,TRAU,BIO,SG) [id number] Right 2 Implanted  STEM POLY PAT PLY 76M KNEE - [id number] Knees STEM POLY PAT PLY 76M KNEE  ZIMMER RECON(ORTH,TRAU,BIO,SG) [id number] Right 1 Implanted  FEMUR CMT CCR STD SZ8 R KNEE - [id number] Knees FEMUR CMT CCR STD SZ8 R KNEE  ZIMMER RECON(ORTH,TRAU,BIO,SG) [id number] Right 1 Implanted  BSPLAT TIB 5D E CMNT KN RT - [id number] Knees BSPLAT TIB 5D E CMNT KN RT  ZIMMER RECON(ORTH,TRAU,BIO,SG) [id number] Right 1 Implanted  INSERT TIB ASF PSN 6-9X12 RT - [id number] Insert INSERT TIB ASF PSN 6-9X12 RT  ZIMMER RECON(ORTH,TRAU,BIO,SG) [id number] Right 1 Implanted   Surgeon: Vanita Panda. Magnus Ivan, MD Assistant: Rexene Edison, PA-C  Anesthesia: #1 right lower extremity adductor canal block, #2 spinal, #3 local Antibiotics: IV Ancef Tourniquet time: 65 minutes EBL: Less than 100 cc Complications: None  Indications: The patient is a 72 year old female with debilitating severe end-stage arthritis involving her right knee.  She has significant valgus malalignment and at this point her right knee pain is daily and is detrimentally affecting her mobility, her quality of life and her actives of daily living.  She has tried and failed conservative treatment for well over a year now.  At this point she does wish to proceed with a knee replacement and I agree with this as well given her clinical exam findings and x-ray findings combined with the failure conservative treatment.  We did talk about the risk of acute blood loss anemia, nerve or vessel injury, fracture, infection, DVT, implant failure  and wound healing issues.  We talked about her goals being hopefully decrease pain, improve mobility, and improve quality of life.  Procedure description: After informed consent was obtained and the appropriate right knee was marked, anesthesia obtained a right lower extremity adductor canal block in the holding room.  The patient was then brought to the operating room and set up on the operating table where spinal anesthesia was obtained.  She was laid in the supine position on the operating table and a Foley catheter was placed.  A nonsterile tourniquet is placed around her upper right thigh and the right thigh, knee, leg and ankle were prepped and draped with DuraPrep and sterile drapes including a sterile stockinette.  A timeout was called and she was identified as the correct patient and the correct right knee.  We then used Esmarch to wrap out the leg and the tourniquet was inflated to 300 mm of pressure.  With the knee extended a midline incision was made over the patella and carried proximally and distally.  Dissection was carried down to the knee joint and a medial parapatellar arthrotomy was made finding a large joint effusion and significant cartilage wear throughout the knee with significant deformity especially involving the lateral compartment of the knee.  With the knee in a flexed position the ACL PCL medial and lateral meniscus were removed.  We removed osteophytes from all 3 compartments.  We then used extramedullary cutting guide for making her proximal tibia cut correction for varus and valgus and a 5 degree slope.  We made this cut to take 2 mm off the low side and after we made the cut  we backed it out 4 more millimeters.  We then used a intramedullary cutting guide for distal femur cut setting this for a right knee at 5 degrees externally rotated.  We made this cut without difficulty and brought the knee back down to full extension and with a 10 mm extension block achieved full extension.   We then back to the femur and put a femoral sizing guide based off the epicondylar axis.  Based off of this we chose a size 8 femur.  We put a 4-in-1 cutting block for a size 8 femur and made our anterior and posterior cuts followed our chamfer cuts.  We then made our femoral box cut.  Attention was turned back to the tibia.  We chose a size E right tibial tray for coverage over the tibial plateau setting rotation off of the tibial tubercle and the femur.  We did our keel punch and drill hole off of this.  We then went back to the femur and put our size 8 PS standard femur and made our femoral box cut.  We then trialed using a PS insert at a 10 mm insert and went up to 12 mm insert and we are pleased with range of motion and stability with the 12 mm thickness insert.  We then made our patella cut and drilled 3 holes for a size 32 cemented patella button.  Again with all trial instrumentation the knee we are pleased with range of motion and stability and improvement in her alignment.  We then removed all trial instrumentation from the knee and irrigated the knee with normal saline solution.  We then dried the knee roll well and placed our Marcaine with epinephrine around the arthrotomy.  We then mixed our cement with the knee in a flexed position cemented our Biomet Zimmer persona tibial tray size E for a right knee followed by cementing our size 8 right standard PS femur.  We placed our 12 mm thickness PS polyethylene insert and cemented our size 32 patella button.  We then held the knee extended and compressed while the cement hardened.  Once that it hardened hemostasis was obtained electrocautery.  The arthrotomy was closed with interrupted #1 Vicryl suture followed by 0 Vicryl close the deep tissue and 2-0 Vicryl to close the subcutaneous tissue.  The skin was closed with staples.  Well-padded sterile dressing was applied.  The patient was taken the recovery room in stable condition.  Rexene Edison, PA-C did assist  on the entire case from beginning to end and his assistance was crucial and medically necessary for soft tissue management and retraction, helping guide implant placement and a layered closure of the wound.

## 2022-12-15 DIAGNOSIS — Z823 Family history of stroke: Secondary | ICD-10-CM | POA: Diagnosis not present

## 2022-12-15 DIAGNOSIS — F320 Major depressive disorder, single episode, mild: Secondary | ICD-10-CM | POA: Diagnosis present

## 2022-12-15 DIAGNOSIS — E785 Hyperlipidemia, unspecified: Secondary | ICD-10-CM | POA: Diagnosis present

## 2022-12-15 DIAGNOSIS — Z87892 Personal history of anaphylaxis: Secondary | ICD-10-CM | POA: Diagnosis not present

## 2022-12-15 DIAGNOSIS — K219 Gastro-esophageal reflux disease without esophagitis: Secondary | ICD-10-CM | POA: Diagnosis present

## 2022-12-15 DIAGNOSIS — Z880 Allergy status to penicillin: Secondary | ICD-10-CM | POA: Diagnosis not present

## 2022-12-15 DIAGNOSIS — Z820 Family history of epilepsy and other diseases of the nervous system: Secondary | ICD-10-CM | POA: Diagnosis not present

## 2022-12-15 DIAGNOSIS — M1711 Unilateral primary osteoarthritis, right knee: Secondary | ICD-10-CM | POA: Diagnosis present

## 2022-12-15 DIAGNOSIS — I10 Essential (primary) hypertension: Secondary | ICD-10-CM | POA: Diagnosis present

## 2022-12-15 DIAGNOSIS — Z9842 Cataract extraction status, left eye: Secondary | ICD-10-CM | POA: Diagnosis not present

## 2022-12-15 DIAGNOSIS — Z833 Family history of diabetes mellitus: Secondary | ICD-10-CM | POA: Diagnosis not present

## 2022-12-15 DIAGNOSIS — Z9889 Other specified postprocedural states: Secondary | ICD-10-CM | POA: Diagnosis not present

## 2022-12-15 DIAGNOSIS — Z86010 Personal history of colonic polyps: Secondary | ICD-10-CM | POA: Diagnosis not present

## 2022-12-15 DIAGNOSIS — Z90710 Acquired absence of both cervix and uterus: Secondary | ICD-10-CM | POA: Diagnosis not present

## 2022-12-15 DIAGNOSIS — Z83719 Family history of colon polyps, unspecified: Secondary | ICD-10-CM | POA: Diagnosis not present

## 2022-12-15 DIAGNOSIS — Z7982 Long term (current) use of aspirin: Secondary | ICD-10-CM | POA: Diagnosis not present

## 2022-12-15 DIAGNOSIS — Z881 Allergy status to other antibiotic agents status: Secondary | ICD-10-CM | POA: Diagnosis not present

## 2022-12-15 DIAGNOSIS — Z96651 Presence of right artificial knee joint: Secondary | ICD-10-CM | POA: Diagnosis present

## 2022-12-15 DIAGNOSIS — M8580 Other specified disorders of bone density and structure, unspecified site: Secondary | ICD-10-CM | POA: Diagnosis present

## 2022-12-15 DIAGNOSIS — Z79899 Other long term (current) drug therapy: Secondary | ICD-10-CM | POA: Diagnosis not present

## 2022-12-15 DIAGNOSIS — Z9841 Cataract extraction status, right eye: Secondary | ICD-10-CM | POA: Diagnosis not present

## 2022-12-15 LAB — BASIC METABOLIC PANEL
Anion gap: 8 (ref 5–15)
BUN: 15 mg/dL (ref 8–23)
CO2: 24 mmol/L (ref 22–32)
Calcium: 8.6 mg/dL — ABNORMAL LOW (ref 8.9–10.3)
Chloride: 105 mmol/L (ref 98–111)
Creatinine, Ser: 0.71 mg/dL (ref 0.44–1.00)
GFR, Estimated: >60 mL/min (ref >60)
Glucose, Bld: 129 mg/dL — ABNORMAL HIGH (ref 70–99)
Potassium: 3.2 mmol/L — ABNORMAL LOW (ref 3.5–5.1)
Sodium: 137 mmol/L (ref 135–145)

## 2022-12-15 LAB — CBC
HCT: 33.1 % — ABNORMAL LOW (ref 36.0–46.0)
Hemoglobin: 10.7 g/dL — ABNORMAL LOW (ref 12.0–15.0)
MCH: 28.7 pg (ref 26.0–34.0)
MCHC: 32.3 g/dL (ref 30.0–36.0)
MCV: 88.7 fL (ref 80.0–100.0)
Platelets: 201 10*3/uL (ref 150–400)
RBC: 3.73 MIL/uL — ABNORMAL LOW (ref 3.87–5.11)
RDW: 14.1 % (ref 11.5–15.5)
WBC: 8.7 10*3/uL (ref 4.0–10.5)
nRBC: 0 % (ref 0.0–0.2)

## 2022-12-15 NOTE — Care Management Important Message (Signed)
Important Message  Patient Details  Name: Candace Bell MRN: [id number] Date of Birth: Oct 17, 1950   Medicare Important Message Given:  Yes     Georgie Chard, LCSW 12/15/2022, 8:34 AM

## 2022-12-15 NOTE — Progress Notes (Signed)
Subjective: 1 Day Post-Op Procedure(s) (LRB): RIGHT TOTAL KNEE ARTHROPLASTY (Right) Patient reports pain as moderate.  Family at the bedside.  Objective: Vital signs in last 24 hours: Temp:  [96.8 F (36 C)-98.6 F (37 C)] 98.6 F (37 C) (09/07 0439) Pulse Rate:  [52-101] 89 (09/07 0439) Resp:  [13-19] 18 (09/07 0439) BP: (106-137)/(70-93) 137/73 (09/07 0439) SpO2:  [93 %-100 %] 97 % (09/07 0439)  Intake/Output from previous day: 09/06 0701 - 09/07 0700 In: 2394.5 [P.O.:420; I.V.:1924.5; IV Piggyback:50] Out: 1650 [Urine:1600; Blood:50] Intake/Output this shift: No intake/output data recorded.  Recent Labs    12/15/22 0342  HGB 10.7*   Recent Labs    12/15/22 0342  WBC 8.7  RBC 3.73*  HCT 33.1*  PLT 201   Recent Labs    12/15/22 0342  NA 137  K 3.2*  CL 105  CO2 24  BUN 15  CREATININE 0.71  GLUCOSE 129*  CALCIUM 8.6*   No results for input(s): "LABPT", "INR" in the last 72 hours.  Sensation intact distally Intact pulses distally Dorsiflexion/Plantar flexion intact Incision: dressing C/D/I No cellulitis present Compartment soft   Assessment/Plan: 1 Day Post-Op Procedure(s) (LRB): RIGHT TOTAL KNEE ARTHROPLASTY (Right) Up with therapy  Discharge pending how she does with therapy.  Really good family support.    Kathryne Hitch 12/15/2022, 10:49 AM

## 2022-12-15 NOTE — Plan of Care (Signed)
  Problem: Pain Management: Goal: Pain level will decrease with appropriate interventions Outcome: Progressing   Problem: Coping: Goal: Level of anxiety will decrease Outcome: Progressing   

## 2022-12-15 NOTE — Progress Notes (Signed)
PT Cancellation Note  Patient Details Name: SANTASIA SCHOMBERG MRN: [id number] DOB: 08-29-1950   Cancelled Treatment:    Reason Eval/Treat Not Completed:  Attempted PT tx session-pt unable to participate 2* just receiving IV pain meds-difficulty remaining alert/keeping her eyes open. Will check back later today.    Faye Ramsay, PT Acute Rehabilitation  Office: [phone]

## 2022-12-15 NOTE — Discharge Instructions (Signed)

## 2022-12-15 NOTE — Progress Notes (Signed)
The patient complains of pain 10/10 right knee. Pt/ family request IV analgesic be administered.

## 2022-12-15 NOTE — Care Management Obs Status (Signed)
MEDICARE OBSERVATION STATUS NOTIFICATION   Patient Details  Name: Candace Bell MRN: [id number] Date of Birth: 03/11/1951   Medicare Observation Status Notification Given:  Yes    Georgie Chard, LCSW 12/15/2022, 8:35 AM

## 2022-12-15 NOTE — Progress Notes (Signed)
The patient reports poor pain control 8/10 with activity. The patient has been out of bed to the bedside commode x 8.The pt and her daughter are apprehensive with regard to use of narcotic pain medications. Pt reports pain improving some when at rest. Discussed pain management alternatives and encouraged pt to call when in need of assistance.

## 2022-12-15 NOTE — Progress Notes (Signed)
The patient had an episode of emesis, antiemetic administered to assist. She denies abdominal pain or discomfort, positive bowel sounds.

## 2022-12-15 NOTE — Progress Notes (Signed)
Physical Therapy Treatment Patient Details Name: Candace Bell MRN: [id number] DOB: 23-Sep-1950 Today's Date: 12/15/2022   History of Present Illness 72 yo female presents to therapy s/p R TKA on 12/14/2022 due to failure of conservative measures. Pt PMH includes but is not limited to: HTN, SOB, HLD, back pain, and GERD.    PT Comments  2nd attempt to work with patient today. Pt has not progressed on today.Pt continues to exhibit poor alertness and impaired cognition. She has been medicated today (IV pain meds-pt requested per RN after getting up to bathroom) and continues to have poor pain control/poor activity tolerance with all mobility and exercises. Encouraged family to discuss poor alertness/cognition and poor pain control with surgical team when they round on tomorrow (to see if any adjustments need to be made with pain control regimen). Will continue to progress activity as tolerated/safely able.   If plan is discharge home, recommend the following: A lot of help with walking and/or transfers;Assistance with cooking/housework;Assist for transportation;Help with stairs or ramp for entrance;A lot of help with bathing/dressing/bathroom   Can travel by private vehicle        Equipment Recommendations  Rolling walker (2 wheels)    Recommendations for Other Services       Precautions / Restrictions Precautions Precautions: Knee;Fall Restrictions Weight Bearing Restrictions: No RLE Weight Bearing: Weight bearing as tolerated     Mobility  Bed Mobility Overal bed mobility: Needs Assistance Bed Mobility: Supine to Sit, Sit to Supine     Supine to sit: Min assist, HOB elevated, Used rails Sit to supine: Max assist   General bed mobility comments: Min A for supine>sit; Max A for sit>supine (mostly due to impaired cognition and decreased alertness). Multimodal cueing required. Sat EOB for ~ 5 minutes. Deferred attempt at standing for safety reasons/fall risk.    Transfers                         Ambulation/Gait                   Stairs             Wheelchair Mobility     Tilt Bed    Modified Rankin (Stroke Patients Only)       Balance Overall balance assessment: Needs assistance Sitting-balance support: Bilateral upper extremity supported, Feet supported Sitting balance-Leahy Scale: Fair                                      Cognition Arousal: Lethargic, Suspect due to medications Behavior During Therapy: Flat affect Overall Cognitive Status: Impaired/Different from baseline Area of Impairment: Orientation, Memory, Following commands, Safety/judgement                 Orientation Level: Disoriented to, Time, Place   Memory: Decreased short-term memory Following Commands: Follows one step commands with increased time Safety/Judgement: Decreased awareness of safety     General Comments: pt having difficulty remaining alert/keeping eyes open during session. repeated multimiodal cueing required.        Exercises Total Joint Exercises Ankle Circles/Pumps: AROM, Both, 10 reps Heel Slides: AAROM, Right, 10 reps Hip ABduction/ADduction: AAROM, Right, 10 reps Straight Leg Raises: AAROM, Right, 10 reps Goniometric ROM: ~10-30 degrees (limited by pain, cognition)    General Comments        Pertinent Vitals/Pain Pain Assessment Pain Assessment: Faces  Faces Pain Scale: Hurts worst Pain Location: R LE/knee Pain Descriptors / Indicators: Aching, Constant, Discomfort, Grimacing, Operative site guarding, Restless Pain Intervention(s): Limited activity within patient's tolerance, Monitored during session, Repositioned, Ice applied    Home Living                          Prior Function            PT Goals (current goals can now be found in the care plan section) Progress towards PT goals: Not progressing toward goals - comment (2* poor pain control/impaired alertness and cognition)     Frequency    7X/week      PT Plan      Co-evaluation              AM-PAC PT "6 Clicks" Mobility   Outcome Measure  Help needed turning from your back to your side while in a flat bed without using bedrails?: A Lot Help needed moving from lying on your back to sitting on the side of a flat bed without using bedrails?: A Lot Help needed moving to and from a bed to a chair (including a wheelchair)?: Total Help needed standing up from a chair using your arms (e.g., wheelchair or bedside chair)?: Total Help needed to walk in hospital room?: Total Help needed climbing 3-5 steps with a railing? : Total 6 Click Score: 8    End of Session Equipment Utilized During Treatment: Gait belt Activity Tolerance: Patient limited by pain;Patient limited by lethargy Patient left: in bed;with call bell/phone within reach;with bed alarm set;with family/visitor present   PT Visit Diagnosis: Unsteadiness on feet (R26.81);Other abnormalities of gait and mobility (R26.89);Difficulty in walking, not elsewhere classified (R26.2);Pain Pain - Right/Left: Right Pain - part of body: Knee     Time: 1308-6578 PT Time Calculation (min) (ACUTE ONLY): 45 min  Charges:    $Therapeutic Exercise: 8-22 mins $Therapeutic Activity: 23-37 mins PT General Charges $$ ACUTE PT VISIT: 1 Visit                         Faye Ramsay, PT Acute Rehabilitation  Office: [phone]

## 2022-12-16 MED ORDER — POTASSIUM CHLORIDE CRYS ER 20 MEQ PO TBCR
20.0000 meq | EXTENDED_RELEASE_TABLET | Freq: Two times a day (BID) | ORAL | Status: AC
  Administered 2022-12-16 – 2022-12-17 (×2): 20 meq via ORAL
  Filled 2022-12-16 (×2): qty 1

## 2022-12-16 NOTE — Progress Notes (Signed)
Assisted patient to [facility], took max assist x3 people, patient not moving either foot to take steps without someone physically lifting each leg, keeps leaning forward and letting go of walker, not following commands as far as walker placement and body position, took over 45 minutes to pivot to [facility] and then put back to bed, daughter agreeable for patient to utilize purewick overnight due to difficulty with mobility and patient not having slept much in 2 days.

## 2022-12-16 NOTE — Progress Notes (Signed)
  Subjective: Patient stable.  Did not do much in the way of physical therapy yesterday.  Currently the pain is controlled on her current pain regimen.  Did not spend much time out of bed in the chair yesterday.   Objective: Vital signs in last 24 hours: Temp:  [98.4 F (36.9 C)-99.3 F (37.4 C)] 99.2 F (37.3 C) (09/08 0525) Pulse Rate:  [83-92] 92 (09/07 1852) Resp:  [16-20] 17 (09/08 0525) BP: (121-164)/(73-95) 121/73 (09/08 0525) SpO2:  [91 %-100 %] 91 % (09/08 0525)  Intake/Output from previous day: 09/07 0701 - 09/08 0700 In: 2539.3 [I.V.:2539.3] Out: 950 [Urine:950] Intake/Output this shift: No intake/output data recorded.  Exam:  Sensation intact distally Dorsiflexion/Plantar flexion intact Compartment soft  Labs: Recent Labs    12/15/22 0342  HGB 10.7*   Recent Labs    12/15/22 0342  WBC 8.7  RBC 3.73*  HCT 33.1*  PLT 201   Recent Labs    12/15/22 0342  NA 137  K 3.2*  CL 105  CO2 24  BUN 15  CREATININE 0.71  GLUCOSE 129*  CALCIUM 8.6*   No results for input(s): "LABPT", "INR" in the last 72 hours.  Assessment/Plan: Plan at this time is mobilization with physical therapy today.  Patient does have an ice machine which she is using.  Therapy for mobilization and range of motion of the knee.  She may or may not be someone that is able to discharge to home based on her current mobilization status.   G Scott Abygail Galeno 12/16/2022, 8:22 AM

## 2022-12-16 NOTE — Progress Notes (Signed)
Physical Therapy Treatment Patient Details Name: Candace Bell MRN: [id number] DOB: 06-25-1950 Today's Date: 12/16/2022   History of Present Illness 72 yo female presents to therapy s/p R TKA on 12/14/2022 due to failure of conservative measures. Pt PMH includes but is not limited to: HTN, SOB, HLD, back pain, and GERD.    PT Comments  Pt agreeable to working with therapy. Urinary urgency once she began to sit up-decreased safety awareness 2* pt rushing to sit on bsc. Min A for mobility tasks this session. She walked ~25 feet. Only one cue for pt to keep her eyes open this session. Improved cognition overall on today. Will plan for d/c home on tomorrow if pt continues to participate well and pain remains fairly well controlled.     If plan is discharge home, recommend the following: A lot of help with walking and/or transfers;Assistance with cooking/housework;Assist for transportation;Help with stairs or ramp for entrance;A lot of help with bathing/dressing/bathroom   Can travel by private vehicle        Equipment Recommendations  Rolling walker (2 wheels)    Recommendations for Other Services       Precautions / Restrictions Precautions Precautions: Knee;Fall Required Braces or Orthoses:  (no KI for 2nd session) Restrictions Weight Bearing Restrictions: No RLE Weight Bearing: Weight bearing as tolerated     Mobility  Bed Mobility Overal bed mobility: Needs Assistance Bed Mobility: Supine to Sit, Sit to Supine     Supine to sit: Contact guard, HOB elevated, Used rails Sit to supine: Min assist, Used rails   General bed mobility comments: Cues for safety, technique. Assist for R LE onto bed. Increased time.    Transfers Overall transfer level: Needs assistance Equipment used: Rolling walker (2 wheels) Transfers: Sit to/from Stand Sit to Stand: Min assist, From elevated surface   Step pivot transfers: Min assist       General transfer comment: Cues for safety,  technique, hand/LE placement. Assist to power up, stabilize, control descent. Poor safety with pt rushing to sit on bsc.    Ambulation/Gait Ambulation/Gait assistance: Min assist Gait Distance (Feet): 25 Feet Assistive device: Rolling walker (2 wheels) Gait Pattern/deviations: Step-to pattern       General Gait Details: Cues for safety, technique, sequencing. Slow gait speed. Only 1 cue to keep eyes open this session.   Stairs             Wheelchair Mobility     Tilt Bed    Modified Rankin (Stroke Patients Only)       Balance Overall balance assessment: Needs assistance         Standing balance support: Bilateral upper extremity supported, During functional activity, Reliant on assistive device for balance Standing balance-Leahy Scale: Poor                              Cognition Arousal: Alert Behavior During Therapy: WFL for tasks assessed/performed Overall Cognitive Status: Impaired/Different from baseline Area of Impairment: Safety/judgement                     Memory: Decreased short-term memory Following Commands: Follows one step commands with increased time Safety/Judgement: Decreased awareness of safety     General Comments: pt having difficulty remaining alert/keeping eyes open during session. repeated multimiodal cueing required.        Exercises Total Joint Exercises Ankle Circles/Pumps: AROM, Both, 10 reps Hip ABduction/ADduction: AAROM, Right, 10  reps Straight Leg Raises: AAROM, Right, 10 reps Knee Flexion: AAROM, Right, 10 reps, Seated Goniometric ROM: ~10-60 degrees    General Comments        Pertinent Vitals/Pain Pain Assessment Pain Assessment: Faces Faces Pain Scale: Hurts even more Pain Location: R LE/knee with activity Pain Descriptors / Indicators: Aching, Constant, Discomfort, Grimacing, Operative site guarding, Restless Pain Intervention(s): Limited activity within patient's tolerance, Monitored  during session, Repositioned, Ice applied    Home Living                          Prior Function            PT Goals (current goals can now be found in the care plan section) Progress towards PT goals: Progressing toward goals    Frequency    7X/week      PT Plan      Co-evaluation              AM-PAC PT "6 Clicks" Mobility   Outcome Measure  Help needed turning from your back to your side while in a flat bed without using bedrails?: A Little Help needed moving from lying on your back to sitting on the side of a flat bed without using bedrails?: A Little Help needed moving to and from a bed to a chair (including a wheelchair)?: A Little Help needed standing up from a chair using your arms (e.g., wheelchair or bedside chair)?: A Little Help needed to walk in hospital room?: A Little Help needed climbing 3-5 steps with a railing? : A Lot 6 Click Score: 17    End of Session Equipment Utilized During Treatment: Gait belt Activity Tolerance: Patient tolerated treatment well;Patient limited by pain Patient left: in bed;with call bell/phone within reach;with bed alarm set;with family/visitor present   PT Visit Diagnosis: Unsteadiness on feet (R26.81);Other abnormalities of gait and mobility (R26.89);Difficulty in walking, not elsewhere classified (R26.2);Pain Pain - Right/Left: Right Pain - part of body: Knee     Time: 0981-1914 PT Time Calculation (min) (ACUTE ONLY): 36 min  Charges:    $Gait Training: 8-22 mins $Therapeutic Exercise: 8-22 mins $Therapeutic Activity: 8-22 mins PT General Charges $$ ACUTE PT VISIT: 1 Visit                        Faye Ramsay, PT Acute Rehabilitation  Office: [phone]

## 2022-12-16 NOTE — Progress Notes (Signed)
Patient voided large amount of clear yellow urine last night on BSC, no output overnight, patient is continent, no bladder distention or discomfort noted, bladder scan completed for 336 ml's, patient stated she will be able to void when she gets up to the [facility] again but she did not feel the need to go at this time, will try to wait for PT to assist as patient was extremely difficult to move last night and staff members are limited at this time, will continue to monitor to see if we need to get her up sooner.

## 2022-12-16 NOTE — Progress Notes (Signed)
Physical Therapy Treatment Patient Details Name: Candace Bell MRN: [id number] DOB: 02-25-1951 Today's Date: 12/16/2022   History of Present Illness 72 yo female presents to therapy s/p R TKA on 12/14/2022 due to failure of conservative measures. Pt PMH includes but is not limited to: HTN, SOB, HLD, back pain, and GERD.    PT Comments  Progressing slowly with mobility. Pain with activity. Improvement in alertness but pt still having difficulty keeping her eyes open during the session. Discussed with pt and family that pt likely needs to remain in hospital until tomorrow to continue working on meeting PT goals and safety with mobility.     If plan is discharge home, recommend the following: A lot of help with walking and/or transfers;Assistance with cooking/housework;Assist for transportation;Help with stairs or ramp for entrance;A lot of help with bathing/dressing/bathroom   Can travel by private vehicle        Equipment Recommendations  Rolling walker (2 wheels)    Recommendations for Other Services       Precautions / Restrictions Precautions Precautions: Knee;Fall Required Braces or Orthoses: Knee Immobilizer - Right (used for pain control 9/8) Restrictions Weight Bearing Restrictions: No RLE Weight Bearing: Weight bearing as tolerated     Mobility  Bed Mobility               General bed mobility comments: oob in recliner    Transfers Overall transfer level: Needs assistance Equipment used: Rolling walker (2 wheels) Transfers: Sit to/from Stand Sit to Stand: Mod assist           General transfer comment: Multimodal cueing for safety, technique, hand/LE placement. Assist to power up, stabilize, control descent. Increased time.    Ambulation/Gait Ambulation/Gait assistance: Mod assist, +2 safety/equipment Gait Distance (Feet): 10 Feet Assistive device: Rolling walker (2 wheels) Gait Pattern/deviations: Step-to pattern       General Gait Details: Cues for  safety, technique, sequencing, posture, step lengths, and for pt to keep her eyes open. Assist to stabilize pt througout distance.Close follow with recliner. Distance limited by pt's difficulty keeping eyes open (safety).Transported back to bedside in recliner.   Stairs             Wheelchair Mobility     Tilt Bed    Modified Rankin (Stroke Patients Only)       Balance Overall balance assessment: Needs assistance         Standing balance support: Bilateral upper extremity supported, During functional activity, Reliant on assistive device for balance Standing balance-Leahy Scale: Poor                              Cognition Arousal: Lethargic, Suspect due to medications Behavior During Therapy: Flat affect Overall Cognitive Status: Impaired/Different from baseline Area of Impairment: Orientation, Memory, Following commands, Safety/judgement                     Memory: Decreased short-term memory Following Commands: Follows one step commands with increased time Safety/Judgement: Decreased awareness of safety     General Comments: pt having difficulty remaining alert/keeping eyes open during session. repeated multimiodal cueing required.        Exercises Total Joint Exercises Ankle Circles/Pumps: AROM, Both, 10 reps Hip ABduction/ADduction: AAROM, Right, 10 reps Straight Leg Raises: AAROM, Right, 10 reps    General Comments        Pertinent Vitals/Pain Pain Assessment Pain Assessment: Faces Faces Pain Scale: Hurts  even more Pain Location: R LE/knee with activity. (pt rated 3/10 at rest) Pain Descriptors / Indicators: Aching, Constant, Discomfort, Grimacing, Operative site guarding, Restless Pain Intervention(s): Limited activity within patient's tolerance, Monitored during session, Repositioned, Ice applied, Premedicated before session    Home Living                          Prior Function            PT Goals (current  goals can now be found in the care plan section) Progress towards PT goals: Progressing toward goals    Frequency    7X/week      PT Plan      Co-evaluation              AM-PAC PT "6 Clicks" Mobility   Outcome Measure  Help needed turning from your back to your side while in a flat bed without using bedrails?: A Lot Help needed moving from lying on your back to sitting on the side of a flat bed without using bedrails?: A Lot Help needed moving to and from a bed to a chair (including a wheelchair)?: A Lot Help needed standing up from a chair using your arms (e.g., wheelchair or bedside chair)?: A Lot Help needed to walk in hospital room?: A Lot Help needed climbing 3-5 steps with a railing? : Total 6 Click Score: 11    End of Session Equipment Utilized During Treatment: Gait belt;Right knee immobilizer Activity Tolerance: Patient limited by pain;Patient limited by lethargy Patient left: in chair;with call bell/phone within reach;with family/visitor present;with chair alarm set   PT Visit Diagnosis: Unsteadiness on feet (R26.81);Other abnormalities of gait and mobility (R26.89);Difficulty in walking, not elsewhere classified (R26.2);Pain Pain - Right/Left: Right Pain - part of body: Knee     Time: 1610-9604 PT Time Calculation (min) (ACUTE ONLY): 24 min  Charges:    $Gait Training: 8-22 mins $Therapeutic Exercise: 8-22 mins PT General Charges $$ ACUTE PT VISIT: 1 Visit                         Faye Ramsay, PT Acute Rehabilitation  Office: [phone]

## 2022-12-16 NOTE — Plan of Care (Signed)
  Problem: Pain Managment: Goal: General experience of comfort will improve Outcome: Progressing   Problem: Coping: Goal: Level of anxiety will decrease Outcome: Progressing   

## 2022-12-17 ENCOUNTER — Other Ambulatory Visit: Payer: Self-pay | Admitting: Family Medicine

## 2022-12-17 ENCOUNTER — Encounter (HOSPITAL_COMMUNITY): Payer: Self-pay | Admitting: Orthopaedic Surgery

## 2022-12-17 DIAGNOSIS — R1013 Epigastric pain: Secondary | ICD-10-CM

## 2022-12-17 MED ORDER — ASPIRIN 81 MG PO CHEW: 81.0000 mg | CHEWABLE_TABLET | Freq: Two times a day (BID) | ORAL | 0 refills | Status: AC

## 2022-12-17 MED ORDER — OXYCODONE HCL 5 MG PO TABS: 5.0000 mg | ORAL_TABLET | Freq: Four times a day (QID) | ORAL | 0 refills | Status: DC | PRN

## 2022-12-17 MED ORDER — METHOCARBAMOL 500 MG PO TABS: 500.0000 mg | ORAL_TABLET | Freq: Four times a day (QID) | ORAL | 1 refills | Status: DC | PRN

## 2022-12-17 NOTE — Progress Notes (Signed)
Physical Therapy Treatment Patient Details Name: Candace Bell MRN: [id number] DOB: [birth year] Today's Date: 12/17/2022   History of Present Illness 72 yo female presents to therapy s/p R TKA on 12/14/2022 due to failure of conservative measures. Pt PMH includes but is not limited to: HTN, SOB, HLD, back pain, and GERD.    PT Comments  Pt agreeable to working with therapy. Pain appears controlled-some increase with activity which is expected. Reviewed/practiced ROM exercises and gait training. Verbally reviewed (with pt and family member) sequencing for going up down small threshold through door. Encouraged pt to ambulate often at home on today, ice after activity, and for pt to try to get some rest tonight. Plan for HHPT f/u set up per chart review.     If plan is discharge home, recommend the following: A lot of help with walking and/or transfers;Assistance with cooking/housework;Assist for transportation;Help with stairs or ramp for entrance;A lot of help with bathing/dressing/bathroom   Can travel by private vehicle        Equipment Recommendations  Rolling walker (2 wheels)    Recommendations for Other Services       Precautions / Restrictions Precautions Precautions: Fall;Knee Restrictions Weight Bearing Restrictions: No RLE Weight Bearing: Weight bearing as tolerated     Mobility  Bed Mobility               General bed mobility comments: oob in recliner    Transfers Overall transfer level: Needs assistance Equipment used: Rolling walker (2 wheels) Transfers: Sit to/from Stand Sit to Stand: Contact guard assist           General transfer comment: Increased time. Cues for safety, technique, hand/LE placement.    Ambulation/Gait Ambulation/Gait assistance: Contact guard assist Gait Distance (Feet): 60 Feet Assistive device: Rolling walker (2 wheels) Gait Pattern/deviations: Step-to pattern       General Gait Details: Slow gait speed. No LOB with RW  use. Pt tolerated distance well.   Stairs             Wheelchair Mobility     Tilt Bed    Modified Rankin (Stroke Patients Only)       Balance Overall balance assessment: Needs assistance         Standing balance support: Bilateral upper extremity supported, During functional activity, Reliant on assistive device for balance Standing balance-Leahy Scale: Poor                              Cognition Arousal: Alert Behavior During Therapy: WFL for tasks assessed/performed Overall Cognitive Status: Within Functional Limits for tasks assessed                                          Exercises Total Joint Exercises Ankle Circles/Pumps: AROM, Both, 10 reps Quad Sets: AROM, Right, 5 reps Hip ABduction/ADduction: AAROM, Right, 5 reps Straight Leg Raises: AAROM, Right, 5 reps Knee Flexion: AAROM, Right, 5 reps, Seated Goniometric ROM: ~10-60 degrees    General Comments        Pertinent Vitals/Pain Pain Assessment Pain Assessment: Faces Faces Pain Scale: Hurts even more Pain Location: R LE/knee with activity Pain Descriptors / Indicators: Aching, Discomfort, Grimacing, Operative site guarding Pain Intervention(s): Limited activity within patient's tolerance, Monitored during session, Ice applied    Home Living  Prior Function            PT Goals (current goals can now be found in the care plan section) Progress towards PT goals: Progressing toward goals    Frequency    7X/week      PT Plan      Co-evaluation              AM-PAC PT "6 Clicks" Mobility   Outcome Measure  Help needed turning from your back to your side while in a flat bed without using bedrails?: A Little Help needed moving from lying on your back to sitting on the side of a flat bed without using bedrails?: A Little Help needed moving to and from a bed to a chair (including a wheelchair)?: A Little Help  needed standing up from a chair using your arms (e.g., wheelchair or bedside chair)?: A Little Help needed to walk in hospital room?: A Little Help needed climbing 3-5 steps with a railing? : A Little 6 Click Score: 18    End of Session Equipment Utilized During Treatment: Gait belt Activity Tolerance: Patient tolerated treatment well Patient left: in chair;with call bell/phone within reach;with family/visitor present   PT Visit Diagnosis: Unsteadiness on feet (R26.81);Other abnormalities of gait and mobility (R26.89);Difficulty in walking, not elsewhere classified (R26.2);Pain Pain - Right/Left: Right Pain - part of body: Knee     Time: 6295-2841 PT Time Calculation (min) (ACUTE ONLY): 26 min  Charges:    $Gait Training: 8-22 mins $Therapeutic Exercise: 8-22 mins PT General Charges $$ ACUTE PT VISIT: 1 Visit                         Faye Ramsay, PT Acute Rehabilitation  Office: [phone]

## 2022-12-17 NOTE — Discharge Summary (Signed)
Patient ID: Candace Bell MRN: [id number] DOB/AGE: 09/19/1950 72 y.o.  Admit date: 12/14/2022 Discharge date: 12/17/2022  Admission Diagnoses:  Principal Problem:   Unilateral primary osteoarthritis, right knee Active Problems:   Status post total right knee replacement   Discharge Diagnoses:  Same  Past Medical History:  Diagnosis Date   Arthritis    generalized   Cataract 2015   bilateral sx   GERD (gastroesophageal reflux disease)    with certain foods   Hypertension    on meds   Osteopenia 10/13/2015    Surgeries: Procedure(s): RIGHT TOTAL KNEE ARTHROPLASTY on 12/14/2022   Consultants:   Discharged Condition: Improved  Hospital Course: Candace Bell is an 72 y.o. female who was admitted 12/14/2022 for operative treatment ofUnilateral primary osteoarthritis, right knee. Patient has severe unremitting pain that affects sleep, daily activities, and work/hobbies. After pre-op clearance the patient was taken to the operating room on 12/14/2022 and underwent  Procedure(s): RIGHT TOTAL KNEE ARTHROPLASTY.    Patient was given perioperative antibiotics:  Anti-infectives (From admission, onward)    Start     Dose/Rate Route Frequency Ordered Stop   12/14/22 1500  ceFAZolin (ANCEF) IVPB 1 g/50 mL premix        1 g 100 mL/hr over 30 Minutes Intravenous Every 6 hours 12/14/22 1345 12/14/22 2144   12/14/22 0630  ceFAZolin (ANCEF) IVPB 2g/100 mL premix        2 g 200 mL/hr over 30 Minutes Intravenous On call to O.R. 12/14/22 6213 12/14/22 0865        Patient was given sequential compression devices, early ambulation, and chemoprophylaxis to prevent DVT.  Patient benefited maximally from hospital stay and there were no complications.    Recent vital signs: Patient Vitals for the past 24 hrs:  BP Temp Temp src Pulse Resp SpO2  12/17/22 0642 107/75 98.3 F (36.8 C) Oral (!) 104 18 97 %  12/16/22 2209 124/76 -- -- 96 19 100 %  12/16/22 2200 -- 99 F (37.2 C) Oral -- -- --   12/16/22 1305 (!) 143/77 98.3 F (36.8 C) Oral 78 17 98 %     Recent laboratory studies:  Recent Labs    12/15/22 0342  WBC 8.7  HGB 10.7*  HCT 33.1*  PLT 201  NA 137  K 3.2*  CL 105  CO2 24  BUN 15  CREATININE 0.71  GLUCOSE 129*  CALCIUM 8.6*     Discharge Medications:   Allergies as of 12/17/2022       Reactions   Amoxicillin Anaphylaxis, Swelling   Other Anaphylaxis   All -cillins   Penicillins Anaphylaxis, Swelling   Tolerated ancef 2gms well.         Medication List     TAKE these medications    acetaminophen 500 MG tablet Commonly known as: TYLENOL Take 1,000 mg by mouth every 6 (six) hours as needed for moderate pain.   aspirin 81 MG chewable tablet Chew 1 tablet (81 mg total) by mouth 2 (two) times daily.   CALCIUM 1200 PO Take 1 tablet by mouth daily.   methocarbamol 500 MG tablet Commonly known as: ROBAXIN Take 1 tablet (500 mg total) by mouth every 6 (six) hours as needed for muscle spasms.   naproxen sodium 220 MG tablet Commonly known as: ALEVE Take 220 mg by mouth daily as needed (knee pain).   oxyCODONE 5 MG immediate release tablet Commonly known as: Oxy IR/ROXICODONE Take 1-2 tablets (5-10 mg total) by  mouth every 6 (six) hours as needed for moderate pain (pain score 4-6).   pantoprazole 40 MG tablet Commonly known as: PROTONIX TAKE 1 TABLET BY MOUTH EVERY DAY What changed:  when to take this reasons to take this   valsartan-hydrochlorothiazide 160-25 MG tablet Commonly known as: DIOVAN-HCT TAKE 1 TABLET BY MOUTH EVERY DAY   VITAMIN D3 PO Take 1 tablet by mouth daily.               Durable Medical Equipment  (From admission, onward)           Start     Ordered   12/14/22 1346  DME 3 n 1  Once        12/14/22 1345   12/14/22 1346  DME Walker rolling  Once       Question Answer Comment  Walker: With 5 Inch Wheels   Patient needs a walker to treat with the following condition Status post total right knee  replacement      12/14/22 1345            Diagnostic Studies: DG Knee Right Port  Result Date: 12/14/2022 CLINICAL DATA:  Post right knee arthroplasty. EXAM: PORTABLE RIGHT KNEE - 1-2 VIEW COMPARISON:  Preoperative radiograph. FINDINGS: Right knee arthroplasty in expected alignment. No periprosthetic lucency or fracture. There has been patellar resurfacing. Recent postsurgical change includes air and edema in the soft tissues and joint space. Anterior skin staples. IMPRESSION: Right knee arthroplasty without immediate postoperative complication. Electronically Signed   By: Narda Rutherford M.D.   On: 12/14/2022 12:20    Disposition: Discharge disposition: 01-Home or Self Care          Follow-up Information     Candace Bell Follow up.   Why: [facility] will provide PT in the home after discharge.        Kathryne Hitch, MD Follow up in 2 week(s).   Specialty: Orthopedic Surgery Contact information: [address] [place] [address] [phone]                  Signed: Kathryne Hitch 12/17/2022, 10:55 AM

## 2022-12-17 NOTE — Plan of Care (Signed)
  Problem: Pain Management: Goal: Pain level will decrease with appropriate interventions Outcome: Progressing   Problem: Coping: Goal: Level of anxiety will decrease Outcome: Progressing   

## 2022-12-17 NOTE — Progress Notes (Signed)
Patient ID: Candace Bell, female   DOB: June 13, 1950, 72 y.o.   MRN: [id number] The patient is awake and alert this morning.  Family is at the bedside.  Her right operative knee is stable and her vital signs are stable.  I did review the notes from physical therapy and she is making very slow progress but is making some progress.  The hope is that she can potentially be to discharge to home this afternoon if she clears therapy.  I will go ahead and put in discharge orders just in case.

## 2022-12-18 ENCOUNTER — Telehealth: Payer: Self-pay | Admitting: Orthopaedic Surgery

## 2022-12-18 ENCOUNTER — Telehealth: Payer: Self-pay | Admitting: *Deleted

## 2022-12-18 NOTE — Telephone Encounter (Signed)
Patient called. Says she hasn't heard from home PT. Her cb# [phone]

## 2022-12-18 NOTE — Telephone Encounter (Signed)
Ortho bundle D/C call completed. 

## 2022-12-19 DIAGNOSIS — M8580 Other specified disorders of bone density and structure, unspecified site: Secondary | ICD-10-CM | POA: Diagnosis not present

## 2022-12-19 DIAGNOSIS — Z471 Aftercare following joint replacement surgery: Secondary | ICD-10-CM | POA: Diagnosis not present

## 2022-12-19 DIAGNOSIS — I10 Essential (primary) hypertension: Secondary | ICD-10-CM | POA: Diagnosis not present

## 2022-12-19 DIAGNOSIS — Z9181 History of falling: Secondary | ICD-10-CM | POA: Diagnosis not present

## 2022-12-19 DIAGNOSIS — Z7982 Long term (current) use of aspirin: Secondary | ICD-10-CM | POA: Diagnosis not present

## 2022-12-19 DIAGNOSIS — Z9842 Cataract extraction status, left eye: Secondary | ICD-10-CM | POA: Diagnosis not present

## 2022-12-19 DIAGNOSIS — K219 Gastro-esophageal reflux disease without esophagitis: Secondary | ICD-10-CM | POA: Diagnosis not present

## 2022-12-19 DIAGNOSIS — Z9841 Cataract extraction status, right eye: Secondary | ICD-10-CM | POA: Diagnosis not present

## 2022-12-19 DIAGNOSIS — Z96651 Presence of right artificial knee joint: Secondary | ICD-10-CM | POA: Diagnosis not present

## 2022-12-19 DIAGNOSIS — H539 Unspecified visual disturbance: Secondary | ICD-10-CM | POA: Diagnosis not present

## 2022-12-19 DIAGNOSIS — M159 Polyosteoarthritis, unspecified: Secondary | ICD-10-CM | POA: Diagnosis not present

## 2022-12-19 DIAGNOSIS — K5900 Constipation, unspecified: Secondary | ICD-10-CM | POA: Diagnosis not present

## 2022-12-21 ENCOUNTER — Other Ambulatory Visit: Payer: Self-pay | Admitting: Family Medicine

## 2022-12-21 DIAGNOSIS — Z9841 Cataract extraction status, right eye: Secondary | ICD-10-CM | POA: Diagnosis not present

## 2022-12-21 DIAGNOSIS — Z471 Aftercare following joint replacement surgery: Secondary | ICD-10-CM | POA: Diagnosis not present

## 2022-12-21 DIAGNOSIS — H539 Unspecified visual disturbance: Secondary | ICD-10-CM | POA: Diagnosis not present

## 2022-12-21 DIAGNOSIS — Z96651 Presence of right artificial knee joint: Secondary | ICD-10-CM | POA: Diagnosis not present

## 2022-12-21 DIAGNOSIS — K5900 Constipation, unspecified: Secondary | ICD-10-CM | POA: Diagnosis not present

## 2022-12-21 DIAGNOSIS — I10 Essential (primary) hypertension: Secondary | ICD-10-CM

## 2022-12-21 DIAGNOSIS — K219 Gastro-esophageal reflux disease without esophagitis: Secondary | ICD-10-CM | POA: Diagnosis not present

## 2022-12-21 DIAGNOSIS — Z7982 Long term (current) use of aspirin: Secondary | ICD-10-CM | POA: Diagnosis not present

## 2022-12-21 DIAGNOSIS — Z9842 Cataract extraction status, left eye: Secondary | ICD-10-CM | POA: Diagnosis not present

## 2022-12-21 DIAGNOSIS — M8580 Other specified disorders of bone density and structure, unspecified site: Secondary | ICD-10-CM | POA: Diagnosis not present

## 2022-12-21 DIAGNOSIS — M159 Polyosteoarthritis, unspecified: Secondary | ICD-10-CM | POA: Diagnosis not present

## 2022-12-21 DIAGNOSIS — Z9181 History of falling: Secondary | ICD-10-CM | POA: Diagnosis not present

## 2022-12-23 DIAGNOSIS — M8580 Other specified disorders of bone density and structure, unspecified site: Secondary | ICD-10-CM | POA: Diagnosis not present

## 2022-12-23 DIAGNOSIS — K5900 Constipation, unspecified: Secondary | ICD-10-CM | POA: Diagnosis not present

## 2022-12-23 DIAGNOSIS — Z7982 Long term (current) use of aspirin: Secondary | ICD-10-CM | POA: Diagnosis not present

## 2022-12-23 DIAGNOSIS — Z9181 History of falling: Secondary | ICD-10-CM | POA: Diagnosis not present

## 2022-12-23 DIAGNOSIS — Z9841 Cataract extraction status, right eye: Secondary | ICD-10-CM | POA: Diagnosis not present

## 2022-12-23 DIAGNOSIS — I10 Essential (primary) hypertension: Secondary | ICD-10-CM | POA: Diagnosis not present

## 2022-12-23 DIAGNOSIS — K219 Gastro-esophageal reflux disease without esophagitis: Secondary | ICD-10-CM | POA: Diagnosis not present

## 2022-12-23 DIAGNOSIS — H539 Unspecified visual disturbance: Secondary | ICD-10-CM | POA: Diagnosis not present

## 2022-12-23 DIAGNOSIS — Z96651 Presence of right artificial knee joint: Secondary | ICD-10-CM | POA: Diagnosis not present

## 2022-12-23 DIAGNOSIS — Z471 Aftercare following joint replacement surgery: Secondary | ICD-10-CM | POA: Diagnosis not present

## 2022-12-23 DIAGNOSIS — M159 Polyosteoarthritis, unspecified: Secondary | ICD-10-CM | POA: Diagnosis not present

## 2022-12-23 DIAGNOSIS — Z9842 Cataract extraction status, left eye: Secondary | ICD-10-CM | POA: Diagnosis not present

## 2022-12-25 DIAGNOSIS — Z471 Aftercare following joint replacement surgery: Secondary | ICD-10-CM | POA: Diagnosis not present

## 2022-12-25 DIAGNOSIS — Z9841 Cataract extraction status, right eye: Secondary | ICD-10-CM | POA: Diagnosis not present

## 2022-12-25 DIAGNOSIS — I10 Essential (primary) hypertension: Secondary | ICD-10-CM | POA: Diagnosis not present

## 2022-12-25 DIAGNOSIS — H539 Unspecified visual disturbance: Secondary | ICD-10-CM | POA: Diagnosis not present

## 2022-12-25 DIAGNOSIS — K5900 Constipation, unspecified: Secondary | ICD-10-CM | POA: Diagnosis not present

## 2022-12-25 DIAGNOSIS — K219 Gastro-esophageal reflux disease without esophagitis: Secondary | ICD-10-CM | POA: Diagnosis not present

## 2022-12-25 DIAGNOSIS — Z7982 Long term (current) use of aspirin: Secondary | ICD-10-CM | POA: Diagnosis not present

## 2022-12-25 DIAGNOSIS — Z9842 Cataract extraction status, left eye: Secondary | ICD-10-CM | POA: Diagnosis not present

## 2022-12-25 DIAGNOSIS — M8580 Other specified disorders of bone density and structure, unspecified site: Secondary | ICD-10-CM | POA: Diagnosis not present

## 2022-12-25 DIAGNOSIS — M159 Polyosteoarthritis, unspecified: Secondary | ICD-10-CM | POA: Diagnosis not present

## 2022-12-25 DIAGNOSIS — Z9181 History of falling: Secondary | ICD-10-CM | POA: Diagnosis not present

## 2022-12-25 DIAGNOSIS — Z96651 Presence of right artificial knee joint: Secondary | ICD-10-CM | POA: Diagnosis not present

## 2022-12-26 DIAGNOSIS — M8580 Other specified disorders of bone density and structure, unspecified site: Secondary | ICD-10-CM | POA: Diagnosis not present

## 2022-12-26 DIAGNOSIS — K5900 Constipation, unspecified: Secondary | ICD-10-CM | POA: Diagnosis not present

## 2022-12-26 DIAGNOSIS — Z471 Aftercare following joint replacement surgery: Secondary | ICD-10-CM | POA: Diagnosis not present

## 2022-12-26 DIAGNOSIS — Z9842 Cataract extraction status, left eye: Secondary | ICD-10-CM | POA: Diagnosis not present

## 2022-12-26 DIAGNOSIS — H539 Unspecified visual disturbance: Secondary | ICD-10-CM | POA: Diagnosis not present

## 2022-12-26 DIAGNOSIS — M159 Polyosteoarthritis, unspecified: Secondary | ICD-10-CM | POA: Diagnosis not present

## 2022-12-26 DIAGNOSIS — I10 Essential (primary) hypertension: Secondary | ICD-10-CM | POA: Diagnosis not present

## 2022-12-26 DIAGNOSIS — K219 Gastro-esophageal reflux disease without esophagitis: Secondary | ICD-10-CM | POA: Diagnosis not present

## 2022-12-26 DIAGNOSIS — Z9841 Cataract extraction status, right eye: Secondary | ICD-10-CM | POA: Diagnosis not present

## 2022-12-26 DIAGNOSIS — Z7982 Long term (current) use of aspirin: Secondary | ICD-10-CM | POA: Diagnosis not present

## 2022-12-26 DIAGNOSIS — Z96651 Presence of right artificial knee joint: Secondary | ICD-10-CM | POA: Diagnosis not present

## 2022-12-26 DIAGNOSIS — Z9181 History of falling: Secondary | ICD-10-CM | POA: Diagnosis not present

## 2022-12-26 NOTE — Therapy (Incomplete)
OUTPATIENT PHYSICAL THERAPY EVALUATION   Patient Name: Candace Bell MRN: [id number] DOB:February 11, 1951, 72 y.o., female Today's Date: 12/26/2022  END OF SESSION:   Past Medical History:  Diagnosis Date   Arthritis    generalized   Cataract 2015   bilateral sx   GERD (gastroesophageal reflux disease)    with certain foods   Hypertension    on meds   Osteopenia 10/13/2015   Past Surgical History:  Procedure Laterality Date   ABDOMINAL HYSTERECTOMY  04/10/1991   TAH --took one ovary   BACK SURGERY  2002   BUNIONECTOMY WITH HAMMERTOE RECONSTRUCTION Right 2014   CATARACT EXTRACTION Bilateral 04/09/2013   COLONOSCOPY  2015   MS-MAC-movi(exc)-ulcerated polyp   right wrist surgery due to fracture     TOTAL KNEE ARTHROPLASTY Right 12/14/2022   Procedure: RIGHT TOTAL KNEE ARTHROPLASTY;  Surgeon: Kathryne Hitch, MD;  Location: [facility];  Service: Orthopedics;  Laterality: Right;   Patient Active Problem List   Diagnosis Date Noted   Status post total right knee replacement 12/14/2022   Preventative health care 07/03/2021   Upper extremity weakness 11/18/2020   Hypertension    Arthritis    Other fatigue 03/07/2020   Loss of appetite 03/07/2020   SOB (shortness of breath) 03/07/2020   Depression, major, single episode, mild (HCC) 03/07/2020   Unilateral primary osteoarthritis, right knee 03/07/2020   Left hand pain 10/21/2018   Hyperlipidemia LDL goal <100 01/26/2018   Memory loss 04/16/2017   Osteopenia 10/13/2015   Back pain 11/25/2013   Essential hypertension 02/29/2012    PCP: Jackelyn Poling DO  REFERRING PROVIDER: Kathryne Hitch, MD  REFERRING DIAG: M17.11 (ICD-10-CM) - Primary osteoarthritis of right knee  THERAPY DIAG:  No diagnosis found.  Rationale for Evaluation and Treatment: Rehabilitation  ONSET DATE:  12/14/2022 surgery date  SUBJECTIVE:   SUBJECTIVE STATEMENT: Rt TKA 12/14/2022  PERTINENT HISTORY: GERD, HTN, osteopenia  PAIN:   NPRS scale: ***/10 Pain location: *** Pain description: *** Aggravating factors: *** Relieving factors: ***  PRECAUTIONS: None  WEIGHT BEARING RESTRICTIONS: No  FALLS:  Has patient fallen in last 6 months? No  LIVING ENVIRONMENT: Lives with: {OPRC lives with:25569::"lives with their family"} Lives in: {Lives in:25570} Stairs: {opstairs:27293} Has following equipment at home: FWW, [facility]  OCCUPATION: ***  PLOF: Independent  PATIENT GOALS: ***   OBJECTIVE:   PATIENT SURVEYS:  12/27/2022 FOTO intake:    predicted:    COGNITION: 12/27/2022 Overall cognitive status: WFL    SENSATION: 12/27/2022 {sensation:27233}  EDEMA:  12/27/2022 {edema:24020}  MUSCLE LENGTH: 12/27/2022 Hamstrings: Right *** deg; Left *** deg Maisie Fus test: Right *** deg; Left *** deg  POSTURE:  12/27/2022 {posture:25561}  PALPATION: 12/27/2022 ***  LOWER EXTREMITY ROM:   ROM Right 12/27/2022 Left 12/27/2022  Hip flexion    Hip extension    Hip abduction    Hip adduction    Hip internal rotation    Hip external rotation    Knee flexion    Knee extension    Ankle dorsiflexion    Ankle plantarflexion    Ankle inversion    Ankle eversion     (Blank rows = not tested)  LOWER EXTREMITY MMT:  MMT Right 12/27/2022 Left 12/27/2022  Hip flexion    Hip extension    Hip abduction    Hip adduction    Hip internal rotation    Hip external rotation    Knee flexion    Knee extension    Ankle dorsiflexion  Ankle plantarflexion    Ankle inversion    Ankle eversion     (Blank rows = not tested)  LOWER EXTREMITY SPECIAL TESTS:  12/27/2022 {LEspecialtests:26242}  FUNCTIONAL TESTS:  12/27/2022 18 inch chair transfer: Lt SLS: Rt SLS:  GAIT: 12/27/2022                                                                                                                                                                         TODAY'S TREATMENT                                                                           DATE:12/27/2022 Therex:    HEP instruction/performance c cues for techniques, handout provided.  Trial set performed of each for comprehension and symptom assessment.  See below for exercise list  PATIENT EDUCATION:  12/27/2022 Education details: HEP, POC Person educated: Patient Education method: Explanation, Demonstration, Verbal cues, and Handouts Education comprehension: verbalized understanding, returned demonstration, and verbal cues required  HOME EXERCISE PROGRAM: ***  ASSESSMENT:  CLINICAL IMPRESSION: Patient is a 72 y.o. who comes to clinic with complaints of Rt knee pain s/p Rt TKA 12/14/2022 with mobility, strength and movement coordination deficits that impair their ability to perform usual daily and recreational functional activities without increase difficulty/symptoms at this time.  Patient to benefit from skilled PT services to address impairments and limitations to improve to previous level of function without restriction secondary to condition.   OBJECTIVE IMPAIRMENTS: {opptimpairments:25111}.   ACTIVITY LIMITATIONS: {activitylimitations:27494}  PARTICIPATION LIMITATIONS: {participationrestrictions:25113}  PERSONAL FACTORS: {Personal factors:25162} are also affecting patient's functional outcome.   REHAB POTENTIAL: Good  CLINICAL DECISION MAKING: Stable/uncomplicated  EVALUATION COMPLEXITY: Low   GOALS: Goals reviewed with patient? Yes  SHORT TERM GOALS: (target date for Short term goals are 3 weeks 01/17/2023)   1.  Patient will demonstrate independent use of home exercise program to maintain progress from in clinic treatments.  Goal status: New  LONG TERM GOALS: (target dates for all long term goals are 10 weeks  03/07/2023 )   1. Patient will demonstrate/report pain at worst less than or equal to 2/10 to facilitate minimal limitation in daily activity secondary to pain symptoms.  Goal status: New   2. Patient will  demonstrate independent use of home exercise program to facilitate ability to maintain/progress functional gains from skilled physical therapy services.  Goal status: New   3. Patient will demonstrate FOTO outcome > or = *** %  to indicate reduced disability due to condition.  Goal status: New   4.  Patient will demonstrate Rt LE MMT 5/5 throughout to faciltiate usual transfers, stairs, squatting at [facility] for daily life.   Goal status: New   5.  Patient will demonstrate Rt knee AROM 0-110 deg to facilitate usual daily mobility, transfers, and ambulation at PLOF.  Goal status: New   6.  *** Goal status: New   7.  *** Goal Status: New   PLAN:  PT FREQUENCY: 1-2x/week  PT DURATION: 10 weeks  PLANNED INTERVENTIONS: Therapeutic exercises, Therapeutic activity, Neuro Muscular re-education, Balance training, Gait training, Patient/Family education, Joint mobilization, Stair training, DME instructions, Dry Needling, Electrical stimulation, Traction, Cryotherapy, vasopneumatic deviceMoist heat, Taping, Ultrasound, Ionotophoresis 4mg /ml Dexamethasone, and aquatic therapy, Manual therapy.  All included unless contraindicated  PLAN FOR NEXT SESSION: Review HEP knowledge/results.   Chyrel Masson, PT, DPT, OCS, ATC 12/26/22  11:25 AM

## 2022-12-27 ENCOUNTER — Ambulatory Visit (INDEPENDENT_AMBULATORY_CARE_PROVIDER_SITE_OTHER): Payer: Medicare Other | Admitting: Physical Therapy

## 2022-12-27 ENCOUNTER — Ambulatory Visit: Payer: Medicare Other | Admitting: Podiatry

## 2022-12-27 ENCOUNTER — Encounter: Payer: Self-pay | Admitting: Physical Therapy

## 2022-12-27 ENCOUNTER — Encounter: Payer: Self-pay | Admitting: Orthopaedic Surgery

## 2022-12-27 ENCOUNTER — Ambulatory Visit (INDEPENDENT_AMBULATORY_CARE_PROVIDER_SITE_OTHER): Payer: Medicare Other | Admitting: Orthopaedic Surgery

## 2022-12-27 ENCOUNTER — Other Ambulatory Visit: Payer: Self-pay

## 2022-12-27 DIAGNOSIS — G8929 Other chronic pain: Secondary | ICD-10-CM

## 2022-12-27 DIAGNOSIS — M25561 Pain in right knee: Secondary | ICD-10-CM

## 2022-12-27 DIAGNOSIS — Z96651 Presence of right artificial knee joint: Secondary | ICD-10-CM

## 2022-12-27 DIAGNOSIS — M6281 Muscle weakness (generalized): Secondary | ICD-10-CM | POA: Diagnosis not present

## 2022-12-27 DIAGNOSIS — R600 Localized edema: Secondary | ICD-10-CM | POA: Diagnosis not present

## 2022-12-27 DIAGNOSIS — M25661 Stiffness of right knee, not elsewhere classified: Secondary | ICD-10-CM

## 2022-12-27 DIAGNOSIS — R262 Difficulty in walking, not elsewhere classified: Secondary | ICD-10-CM

## 2022-12-27 MED ORDER — OXYCODONE HCL 5 MG PO TABS: 5.0000 mg | ORAL_TABLET | Freq: Four times a day (QID) | ORAL | 0 refills | Status: DC | PRN

## 2022-12-27 MED ORDER — CELECOXIB 200 MG PO CAPS: 200.0000 mg | ORAL_CAPSULE | Freq: Two times a day (BID) | ORAL | 2 refills | Status: AC

## 2022-12-27 MED ORDER — TIZANIDINE HCL 2 MG PO TABS: 2.0000 mg | ORAL_TABLET | Freq: Three times a day (TID) | ORAL | 1 refills | Status: DC | PRN

## 2022-12-27 NOTE — Progress Notes (Signed)
The patient is a 72 year old female comes in for first postoperative visit status post a right total knee arthroplasty.  She is ambulating with a walker.  She is not bending her knee well.  She is on home therapy and apparently does have physical therapy here upstairs this afternoon.  On exam her knee is fully extended.  I cannot even flex her much at all.  The staples are removed and Steri-Strips applied.  Her calf is soft.  She has been wearing her TED hose and compliant with a baby aspirin twice a day.  She understands that it is essential that she push harder through therapy to get her knee bending.  I will send in some more oxycodone as well as Celebrex and Zanaflex.  She will stop the methocarbamol (Robaxin) and can stop the baby aspirin.  I will then see her back in 4 weeks to see how she is doing overall.

## 2022-12-27 NOTE — Therapy (Signed)
OUTPATIENT PHYSICAL THERAPY EVALUATION   Patient Name: Candace Bell MRN: [id number] DOB:1950-05-17, 72 y.o., female Today's Date: 12/27/2022  END OF SESSION:  PT End of Session - 12/27/22 1337     Visit Number 1    Number of Visits 16    Date for PT Re-Evaluation 02/21/23    Authorization Type [employer] Medicare $20 copay    Progress Note Due on Visit 10    PT Start Time 1341    PT Stop Time 1417    PT Time Calculation (min) 36 min    Activity Tolerance Patient tolerated treatment well    Behavior During Therapy [facility] for tasks assessed/performed             Past Medical History:  Diagnosis Date   Arthritis    generalized   Cataract 2015   bilateral sx   GERD (gastroesophageal reflux disease)    with certain foods   Hypertension    on meds   Osteopenia 10/13/2015   Past Surgical History:  Procedure Laterality Date   ABDOMINAL HYSTERECTOMY  04/10/1991   TAH --took one ovary   BACK SURGERY  2002   BUNIONECTOMY WITH HAMMERTOE RECONSTRUCTION Right 2014   CATARACT EXTRACTION Bilateral 04/09/2013   COLONOSCOPY  2015   MS-MAC-movi(exc)-ulcerated polyp   right wrist surgery due to fracture     TOTAL KNEE ARTHROPLASTY Right 12/14/2022   Procedure: RIGHT TOTAL KNEE ARTHROPLASTY;  Surgeon: Kathryne Hitch, MD;  Location: [facility];  Service: Orthopedics;  Laterality: Right;   Patient Active Problem List   Diagnosis Date Noted   Status post total right knee replacement 12/14/2022   Preventative health care 07/03/2021   Upper extremity weakness 11/18/2020   Hypertension    Arthritis    Other fatigue 03/07/2020   Loss of appetite 03/07/2020   SOB (shortness of breath) 03/07/2020   Depression, major, single episode, mild (HCC) 03/07/2020   Left hand pain 10/21/2018   Hyperlipidemia LDL goal <100 01/26/2018   Memory loss 04/16/2017   Osteopenia 10/13/2015   Back pain 11/25/2013   Essential hypertension 02/29/2012    PCP: Jackelyn Poling DO  REFERRING PROVIDER:  Kathryne Hitch, MD  REFERRING DIAG: M17.11 (ICD-10-CM) - Primary osteoarthritis of right knee  THERAPY DIAG:  Chronic pain of right knee - Plan: PT plan of care cert/re-cert  Stiffness of right knee, not elsewhere classified - Plan: PT plan of care cert/re-cert  Muscle weakness (generalized) - Plan: PT plan of care cert/re-cert  Difficulty in walking, not elsewhere classified - Plan: PT plan of care cert/re-cert  Localized edema - Plan: PT plan of care cert/re-cert  Rationale for Evaluation and Treatment: Rehabilitation  ONSET DATE:  12/14/2022 surgery date  SUBJECTIVE:   SUBJECTIVE STATEMENT: Pt had a Rt TKA 12/14/2022 with 6 HHPT visits.  She is here today amb with a RW.  PERTINENT HISTORY: GERD, HTN, osteopenia  PAIN:  NPRS scale: 4-5 currently, up to 9-10, at best 0/10 Pain location: Rt knee Pain description: sharp Aggravating factors: walking, bending the knee Relieving factors: rest, medication  PRECAUTIONS: None  WEIGHT BEARING RESTRICTIONS: No  FALLS:  Has patient fallen in last 6 months? No  LIVING ENVIRONMENT: Lives with: lives with their daughter Lives in: House/apartment Stairs: No; has one curb step to enter Has following equipment at home: RW, [facility]  OCCUPATION: retired from [employer]  PLOF: Independent  PATIENT GOALS: get up and take care of self   OBJECTIVE:   PATIENT SURVEYS:  12/27/2022  FOTO intake: 43   predicted:  62  COGNITION: 12/27/2022 Overall cognitive status: WFL    SENSATION: 12/27/2022 WFL  EDEMA:  12/27/2022 Circumferential: Knee joint line: Rt: 49.2 cm   /Lt: 41.8 cm     LOWER EXTREMITY ROM:    12/27/22: significant guarding with flexion PROM   ROM Right 12/27/2022  Knee flexion A: 30 P: 38  Knee extension A: -14 (seated LAQ) P: 0   (Blank rows = not tested)  LOWER EXTREMITY MMT:  Not formally measured; Rt knee grossly 3-/5  MMT Right 12/27/2022 Left 12/27/2022  Hip flexion    Hip extension    Hip  abduction    Hip adduction    Hip internal rotation    Hip external rotation    Knee flexion    Knee extension    Ankle dorsiflexion    Ankle plantarflexion    Ankle inversion    Ankle eversion     (Blank rows = not tested)   12/27/2022 Ambulation with RW mod I; decreased Rt hip/knee flexion, decreased stance on Rt                                                                                                                                                                         TODAY'S TREATMENT DATE 12/27/2022 TherEx: HEP instruction/performance c cues for techniques, handout provided.  Trial set performed of each for comprehension and symptom assessment.  See below for exercise list  PATIENT EDUCATION:  12/27/2022 Education details: HEP, POC Person educated: Patient Education method: Explanation, Demonstration, Verbal cues, and Handouts Education comprehension: verbalized understanding, returned demonstration, and verbal cues required  HOME EXERCISE PROGRAM: Access Code: [email or web address] URL: [email or web address] Date: 12/27/2022 Prepared by: Moshe Cipro  Exercises - Quad Set  - 3-5 x daily - 7 x weekly - 1 sets - 10 reps - 5 sec hold - Supine Heel Slide with Strap  - 5-10 x daily - 7 x weekly - 1 sets - 5-10 reps - Seated Heel Slide  - 3-5 x daily - 7 x weekly - 1 sets - 5-10 reps - 5-10 sec hold - Seated Knee Flexion AAROM  - 3-5 x daily - 7 x weekly - 1 sets - 5-10 reps - 5-10 sec hold  ASSESSMENT:  CLINICAL IMPRESSION: Patient is a 72 y.o. who comes to clinic with complaints of Rt knee pain s/p Rt TKA 12/14/2022 with mobility, strength and movement coordination deficits that impair their ability to perform usual daily and recreational functional activities without increase difficulty/symptoms at this time.  Patient to benefit from skilled PT services to address impairments and limitations to improve to previous level of function without restriction  secondary to condition.   OBJECTIVE IMPAIRMENTS: Abnormal  gait, decreased activity tolerance, decreased knowledge of use of DME, decreased mobility, difficulty walking, decreased ROM, decreased strength, hypomobility, increased edema, increased fascial restrictions, increased muscle spasms, impaired flexibility, and pain.   ACTIVITY LIMITATIONS: carrying, lifting, bending, sitting, standing, sleeping, stairs, transfers, and locomotion level  PARTICIPATION LIMITATIONS: meal prep, cleaning, medication management, driving, shopping, and community activity  PERSONAL FACTORS: Age and 3+ comorbidities: GERD, HTN, osteopenia  are also affecting patient's functional outcome.   REHAB POTENTIAL: Good  CLINICAL DECISION MAKING: Evolving/moderate complexity  EVALUATION COMPLEXITY: Moderate   GOALS: Goals reviewed with patient? Yes  SHORT TERM GOALS: Target Date 01/24/2023   1.  Patient will demonstrate independent use of home exercise program to maintain progress from in clinic treatments. Goal status: New  2.  Patient will demonstrate Rt knee AROM 0-60 deg to facilitate usual daily mobility, transfers, and ambulation at PLOF.  Goal status: New  LONG TERM GOALS: Target Date 02/21/2023   1. Patient will demonstrate/report pain at worst less than or equal to 2/10 to facilitate minimal limitation in daily activity secondary to pain symptoms. Goal status: New   2. Patient will demonstrate independent use of home exercise program to facilitate ability to maintain/progress functional gains from skilled physical therapy services. Goal status: New   3. Patient will demonstrate FOTO outcome > or = 62 % to indicate reduced disability due to condition. Goal status: New   4.  Patient will demonstrate Rt LE MMT 4/5 throughout to faciltiate usual transfers, stairs, squatting at [facility] for daily life.  Goal status: New   5.  Patient will demonstrate Rt knee AROM 0-100 deg to facilitate usual daily  mobility, transfers, and ambulation at PLOF.  Goal status: New   6.  Patient will demonstrate independent ambulation within community distances > 300 ft.  Goal status: New   PLAN:  PT FREQUENCY: 2x/week  PT DURATION: 8 weeks  PLANNED INTERVENTIONS: Therapeutic exercises, Therapeutic activity, Neuro Muscular re-education, Balance training, Gait training, Patient/Family education, Joint mobilization, Stair training, DME instructions, Dry Needling, Electrical stimulation, Traction, Cryotherapy, vasopneumatic deviceMoist heat, Taping, Ultrasound, Ionotophoresis 4mg /ml Dexamethasone, and aquatic therapy, Manual therapy.  All included unless contraindicated  PLAN FOR NEXT SESSION: Review HEP, aggressive flexion focus, vaso PRN  Clarita Crane, PT, DPT 12/27/22 2:28 PM    Date of referral: 12/12/2022 Referring provider: Kathryne Hitch, MD Referring diagnosis? M17.11 (ICD-10-CM) - Primary osteoarthritis of right knee Treatment diagnosis? (if different than referring diagnosis) M25.561  What was this (referring dx) caused by? Surgery (Type: Rt TKA)  Nature of Condition: Initial Onset (within last 3 months)   Laterality: Rt  Current Functional Measure Score: FOTO 43  Objective measurements identify impairments when they are compared to normal values, the uninvolved extremity, and prior level of function.  [x]  Yes  []  No  Objective assessment of functional ability: Moderate functional limitations   Briefly describe symptoms: knee pain and stiffness following TKA  How did symptoms start: Surgery performed on 12/14/2022  Average pain intensity:  Last 24 hours: 5  Past week: 9  How often does the pt experience symptoms? Constantly  How much have the symptoms interfered with usual daily activities? Quite a bit  How has condition changed since care began at this facility? NA - initial visit  In general, how is the patients overall health? Good  (Back screen not  indicated due to TKA)

## 2022-12-28 ENCOUNTER — Telehealth: Payer: Self-pay | Admitting: *Deleted

## 2022-12-28 NOTE — Telephone Encounter (Signed)
Ortho bundle 14 day in office meeting completed. °

## 2023-01-01 ENCOUNTER — Encounter: Payer: Self-pay | Admitting: Physical Therapy

## 2023-01-01 ENCOUNTER — Ambulatory Visit: Payer: Medicare Other | Admitting: Physical Therapy

## 2023-01-01 DIAGNOSIS — R262 Difficulty in walking, not elsewhere classified: Secondary | ICD-10-CM | POA: Diagnosis not present

## 2023-01-01 DIAGNOSIS — G8929 Other chronic pain: Secondary | ICD-10-CM | POA: Diagnosis not present

## 2023-01-01 DIAGNOSIS — M6281 Muscle weakness (generalized): Secondary | ICD-10-CM | POA: Diagnosis not present

## 2023-01-01 DIAGNOSIS — M25661 Stiffness of right knee, not elsewhere classified: Secondary | ICD-10-CM

## 2023-01-01 DIAGNOSIS — R600 Localized edema: Secondary | ICD-10-CM

## 2023-01-01 DIAGNOSIS — M25561 Pain in right knee: Secondary | ICD-10-CM

## 2023-01-01 NOTE — Therapy (Signed)
OUTPATIENT PHYSICAL THERAPY TREATMENT   Patient Name: Candace Bell MRN: [id number] DOB:[birth year], 72 y.o., female Today's Date: 01/01/2023  END OF SESSION:  PT End of Session - 01/01/23 0845     Visit Number 2    Number of Visits 16    Date for PT Re-Evaluation 02/21/23    Authorization Type [employer] Medicare $20 copay    Progress Note Due on Visit 10    PT Start Time 0800    PT Stop Time 0850    PT Time Calculation (min) 50 min    Activity Tolerance Patient tolerated treatment well    Behavior During Therapy [facility] for tasks assessed/performed              Past Medical History:  Diagnosis Date   Arthritis    generalized   Cataract 2015   bilateral sx   GERD (gastroesophageal reflux disease)    with certain foods   Hypertension    on meds   Osteopenia 10/13/2015   Past Surgical History:  Procedure Laterality Date   ABDOMINAL HYSTERECTOMY  04/10/1991   TAH --took one ovary   BACK SURGERY  2002   BUNIONECTOMY WITH HAMMERTOE RECONSTRUCTION Right 2014   CATARACT EXTRACTION Bilateral 04/09/2013   COLONOSCOPY  2015   MS-MAC-movi(exc)-ulcerated polyp   right wrist surgery due to fracture     TOTAL KNEE ARTHROPLASTY Right 12/14/2022   Procedure: RIGHT TOTAL KNEE ARTHROPLASTY;  Surgeon: Kathryne Hitch, MD;  Location: [facility];  Service: Orthopedics;  Laterality: Right;   Patient Active Problem List   Diagnosis Date Noted   Status post total right knee replacement 12/14/2022   Preventative health care 07/03/2021   Upper extremity weakness 11/18/2020   Hypertension    Arthritis    Other fatigue 03/07/2020   Loss of appetite 03/07/2020   SOB (shortness of breath) 03/07/2020   Depression, major, single episode, mild (HCC) 03/07/2020   Left hand pain 10/21/2018   Hyperlipidemia LDL goal <100 01/26/2018   Memory loss 04/16/2017   Osteopenia 10/13/2015   Back pain 11/25/2013   Essential hypertension 02/29/2012    PCP: Jackelyn Poling DO  REFERRING PROVIDER:  Kathryne Hitch, MD  REFERRING DIAG: M17.11 (ICD-10-CM) - Primary osteoarthritis of right knee  THERAPY DIAG:  Chronic pain of right knee  Stiffness of right knee, not elsewhere classified  Muscle weakness (generalized)  Difficulty in walking, not elsewhere classified  Localized edema  Rationale for Evaluation and Treatment: Rehabilitation  ONSET DATE:  12/14/2022 surgery date  SUBJECTIVE:   SUBJECTIVE STATEMENT: Pt arriving today reporting no pain at rest. Pt reporting more tightness with flexion.   PERTINENT HISTORY: GERD, HTN, osteopenia  PAIN:  NPRS scale: no pain at rest, 6/10 with flexion Pain location: Rt knee Pain description: sharp Aggravating factors: walking, bending the knee Relieving factors: rest, medication  PRECAUTIONS: None  WEIGHT BEARING RESTRICTIONS: No  FALLS:  Has patient fallen in last 6 months? No  LIVING ENVIRONMENT: Lives with: lives with their daughter Lives in: House/apartment Stairs: No; has one curb step to enter Has following equipment at home: RW, [facility]  OCCUPATION: retired from [employer]  PLOF: Independent  PATIENT GOALS: get up and take care of self   OBJECTIVE:   PATIENT SURVEYS:  12/27/2022 FOTO intake: 43   predicted:  62  COGNITION: 12/27/2022 Overall cognitive status: WFL    SENSATION: 12/27/2022 WFL  EDEMA:  12/27/2022 Circumferential: Knee joint line: Rt: 49.2 cm   /Lt: 41.8 cm  LOWER EXTREMITY ROM:    12/27/22: significant guarding with flexion PROM   ROM Right 12/27/2022 Rt 01/01/23  Knee flexion A: 30 P: 38 A: 44 P: 48  Knee extension A: -14 (seated LAQ) P: 0 A: -10 P: 0   (Blank rows = not tested)  LOWER EXTREMITY MMT:  Not formally measured; Rt knee grossly 3-/5  MMT Right 12/27/2022 Left 12/27/2022  Hip flexion    Hip extension    Hip abduction    Hip adduction    Hip internal rotation    Hip external rotation    Knee flexion    Knee extension    Ankle dorsiflexion    Ankle  plantarflexion    Ankle inversion    Ankle eversion     (Blank rows = not tested)   12/27/2022 Ambulation with RW mod I; decreased Rt hip/knee flexion, decreased stance on Rt                                                                                                                                                                        TODAY'S TREATMENT DATE 01/01/2023 TherEx: Nustep: Level 3 x 10 minutes, seat 9, UE/LE Standing calf stretch on slant board: x 2 holding 30 sec LAQ: 2 x 10 AAROM: knee flexion in sitting, using left LE to push back on Rt  Supine: heel slides: x 10 holding 5 sec end range Supine: SLR: 2 x 10, pt began with mild extensor lag, after instructions to perform quad set and df before trying to lift her lag improved.  Supine hamstring stretch 3 holding 30 sec Modalites:  Vasopneumatic: medium compression, 34 deg x 10 minutes, LE elevated on bolster Manual:  PROM in seated flexion to pt's tolerance      TODAY'S TREATMENT DATE 12/27/2022 TherEx: HEP instruction/performance c cues for techniques, handout provided.  Trial set performed of each for comprehension and symptom assessment.  See below for exercise list  PATIENT EDUCATION:  12/27/2022 Education details: HEP, POC Person educated: Patient Education method: Explanation, Demonstration, Verbal cues, and Handouts Education comprehension: verbalized understanding, returned demonstration, and verbal cues required  HOME EXERCISE PROGRAM: Access Code: [email or web address] URL: [email or web address] Date: 12/27/2022 Prepared by: Moshe Cipro  Exercises - Quad Set  - 3-5 x daily - 7 x weekly - 1 sets - 10 reps - 5 sec hold - Supine Heel Slide with Strap  - 5-10 x daily - 7 x weekly - 1 sets - 5-10 reps - Seated Heel Slide  - 3-5 x daily - 7 x weekly - 1 sets - 5-10 reps - 5-10 sec hold - Seated Knee Flexion AAROM  - 3-5 x daily - 7 x weekly - 1 sets - 5-10 reps -  5-10 sec  hold  ASSESSMENT:  CLINICAL IMPRESSION: Pt arriving today with RW and pt's daughter. Pt instructed to stay inside her walker during amb. Pt also instructed not to fold her compression stockings down in order to allow for better circulation. Pt instructed to elevate above her heart multiple times each day and focus on flexion exercises. Pt's active knee flexion on her Rt LE is 45 degrees. Pt was issued tennis ball for her RW. Recommend continue skilled PT.   OBJECTIVE IMPAIRMENTS: Abnormal gait, decreased activity tolerance, decreased knowledge of use of DME, decreased mobility, difficulty walking, decreased ROM, decreased strength, hypomobility, increased edema, increased fascial restrictions, increased muscle spasms, impaired flexibility, and pain.   ACTIVITY LIMITATIONS: carrying, lifting, bending, sitting, standing, sleeping, stairs, transfers, and locomotion level  PARTICIPATION LIMITATIONS: meal prep, cleaning, medication management, driving, shopping, and community activity  PERSONAL FACTORS: Age and 3+ comorbidities: GERD, HTN, osteopenia  are also affecting patient's functional outcome.   REHAB POTENTIAL: Good  CLINICAL DECISION MAKING: Evolving/moderate complexity  EVALUATION COMPLEXITY: Moderate   GOALS: Goals reviewed with patient? Yes  SHORT TERM GOALS: Target Date 01/24/2023   1.  Patient will demonstrate independent use of home exercise program to maintain progress from in clinic treatments. Goal status: on-going 01/01/23  2.  Patient will demonstrate Rt knee AROM 0-60 deg to facilitate usual daily mobility, transfers, and ambulation at PLOF.  Goal status: on-going 01/01/23  LONG TERM GOALS: Target Date 02/21/2023   1. Patient will demonstrate/report pain at worst less than or equal to 2/10 to facilitate minimal limitation in daily activity secondary to pain symptoms. Goal status: New   2. Patient will demonstrate independent use of home exercise program to  facilitate ability to maintain/progress functional gains from skilled physical therapy services. Goal status: New   3. Patient will demonstrate FOTO outcome > or = 62 % to indicate reduced disability due to condition. Goal status: New   4.  Patient will demonstrate Rt LE MMT 4/5 throughout to faciltiate usual transfers, stairs, squatting at [facility] for daily life.  Goal status: New   5.  Patient will demonstrate Rt knee AROM 0-100 deg to facilitate usual daily mobility, transfers, and ambulation at PLOF.  Goal status: New   6.  Patient will demonstrate independent ambulation within community distances > 300 ft.  Goal status: New   PLAN:  PT FREQUENCY: 2x/week  PT DURATION: 8 weeks  PLANNED INTERVENTIONS: Therapeutic exercises, Therapeutic activity, Neuro Muscular re-education, Balance training, Gait training, Patient/Family education, Joint mobilization, Stair training, DME instructions, Dry Needling, Electrical stimulation, Traction, Cryotherapy, vasopneumatic deviceMoist heat, Taping, Ultrasound, Ionotophoresis 4mg /ml Dexamethasone, and aquatic therapy, Manual therapy.  All included unless contraindicated  PLAN FOR NEXT SESSION: Review HEP, aggressive flexion focus, vaso PRN  Narda Amber, PT, MPT 01/01/23 9:07 AM   01/01/23 9:07 AM    Date of referral: 12/12/2022 Referring provider: Kathryne Hitch, MD Referring diagnosis? M17.11 (ICD-10-CM) - Primary osteoarthritis of right knee Treatment diagnosis? (if different than referring diagnosis) M25.561  What was this (referring dx) caused by? Surgery (Type: Rt TKA)  Nature of Condition: Initial Onset (within last 3 months)   Laterality: Rt  Current Functional Measure Score: FOTO 43  Objective measurements identify impairments when they are compared to normal values, the uninvolved extremity, and prior level of function.  [x]  Yes  []  No  Objective assessment of functional ability: Moderate functional  limitations   Briefly describe symptoms: knee pain and stiffness following TKA  How did symptoms  start: Surgery performed on 12/14/2022  Average pain intensity:  Last 24 hours: 5  Past week: 9  How often does the pt experience symptoms? Constantly  How much have the symptoms interfered with usual daily activities? Quite a bit  How has condition changed since care began at this facility? NA - initial visit  In general, how is the patients overall health? Good  (Back screen not indicated due to TKA)

## 2023-01-04 ENCOUNTER — Encounter: Payer: Medicare Other | Admitting: Rehabilitative and Restorative Service Providers"

## 2023-01-04 ENCOUNTER — Encounter: Payer: Medicare Other | Admitting: Physical Therapy

## 2023-01-05 ENCOUNTER — Other Ambulatory Visit: Payer: Self-pay | Admitting: Family Medicine

## 2023-01-05 DIAGNOSIS — I10 Essential (primary) hypertension: Secondary | ICD-10-CM

## 2023-01-08 ENCOUNTER — Encounter: Payer: Self-pay | Admitting: Physical Therapy

## 2023-01-08 ENCOUNTER — Ambulatory Visit: Payer: Medicare Other | Admitting: Physical Therapy

## 2023-01-08 DIAGNOSIS — G8929 Other chronic pain: Secondary | ICD-10-CM | POA: Diagnosis not present

## 2023-01-08 DIAGNOSIS — M25661 Stiffness of right knee, not elsewhere classified: Secondary | ICD-10-CM

## 2023-01-08 DIAGNOSIS — R262 Difficulty in walking, not elsewhere classified: Secondary | ICD-10-CM

## 2023-01-08 DIAGNOSIS — M6281 Muscle weakness (generalized): Secondary | ICD-10-CM

## 2023-01-08 DIAGNOSIS — M25561 Pain in right knee: Secondary | ICD-10-CM

## 2023-01-08 DIAGNOSIS — R600 Localized edema: Secondary | ICD-10-CM | POA: Diagnosis not present

## 2023-01-08 NOTE — Therapy (Signed)
OUTPATIENT PHYSICAL THERAPY TREATMENT   Patient Name: Candace Bell MRN: [id number] DOB:08-Dec-1950, 72 y.o., female Today's Date: 01/08/2023  END OF SESSION:  PT End of Session - 01/08/23 0836     Visit Number 3    Number of Visits 16    Date for PT Re-Evaluation 02/21/23    Authorization Type [employer] Medicare $20 copay    Progress Note Due on Visit 10    PT Start Time 0804    PT Stop Time 0854    PT Time Calculation (min) 50 min    Activity Tolerance Patient tolerated treatment well    Behavior During Therapy [facility] for tasks assessed/performed               Past Medical History:  Diagnosis Date   Arthritis    generalized   Cataract 2015   bilateral sx   GERD (gastroesophageal reflux disease)    with certain foods   Hypertension    on meds   Osteopenia 10/13/2015   Past Surgical History:  Procedure Laterality Date   ABDOMINAL HYSTERECTOMY  04/10/1991   TAH --took one ovary   BACK SURGERY  2002   BUNIONECTOMY WITH HAMMERTOE RECONSTRUCTION Right 2014   CATARACT EXTRACTION Bilateral 04/09/2013   COLONOSCOPY  2015   MS-MAC-movi(exc)-ulcerated polyp   right wrist surgery due to fracture     TOTAL KNEE ARTHROPLASTY Right 12/14/2022   Procedure: RIGHT TOTAL KNEE ARTHROPLASTY;  Surgeon: Kathryne Hitch, MD;  Location: [facility];  Service: Orthopedics;  Laterality: Right;   Patient Active Problem List   Diagnosis Date Noted   Status post total right knee replacement 12/14/2022   Preventative health care 07/03/2021   Upper extremity weakness 11/18/2020   Hypertension    Arthritis    Other fatigue 03/07/2020   Loss of appetite 03/07/2020   SOB (shortness of breath) 03/07/2020   Depression, major, single episode, mild (HCC) 03/07/2020   Left hand pain 10/21/2018   Hyperlipidemia LDL goal <100 01/26/2018   Memory loss 04/16/2017   Osteopenia 10/13/2015   Back pain 11/25/2013   Essential hypertension 02/29/2012    PCP: Jackelyn Poling DO  REFERRING PROVIDER:  Kathryne Hitch, MD  REFERRING DIAG: M17.11 (ICD-10-CM) - Primary osteoarthritis of right knee  THERAPY DIAG:  Chronic pain of right knee  Stiffness of right knee, not elsewhere classified  Muscle weakness (generalized)  Difficulty in walking, not elsewhere classified  Localized edema  Rationale for Evaluation and Treatment: Rehabilitation  ONSET DATE:  12/14/2022 surgery date  SUBJECTIVE:   SUBJECTIVE STATEMENT: Pt arriving today reporting today is "good day".   PERTINENT HISTORY: GERD, HTN, osteopenia  PAIN:  NPRS scale: no pain at rest, worse pain 5/10 with flexion Pain location: Rt knee Pain description: sharp Aggravating factors: walking, bending the knee Relieving factors: rest, medication  PRECAUTIONS: None  WEIGHT BEARING RESTRICTIONS: No  FALLS:  Has patient fallen in last 6 months? No  LIVING ENVIRONMENT: Lives with: lives with their daughter Lives in: House/apartment Stairs: No; has one curb step to enter Has following equipment at home: RW, [facility]  OCCUPATION: retired from [employer]  PLOF: Independent  PATIENT GOALS: get up and take care of self   OBJECTIVE:   PATIENT SURVEYS:  12/27/2022 FOTO intake: 43   predicted:  62  COGNITION: 12/27/2022 Overall cognitive status: WFL    SENSATION: 12/27/2022 WFL  EDEMA:  12/27/2022 Circumferential: Knee joint line: Rt: 49.2 cm   /Lt: 41.8 cm     LOWER  EXTREMITY ROM:    12/27/22: significant guarding with flexion PROM   ROM Right 12/27/2022 Rt 01/01/23 Rt 01/08/23  Knee flexion A: 30 P: 38 A: 44 P: 48 A: 55 P; 60  Knee extension A: -14 (seated LAQ) P: 0 A: -10 P: 0 A: -8 P: -2   (Blank rows = not tested)  LOWER EXTREMITY MMT:  Not formally measured; Rt knee grossly 3-/5  MMT Right 12/27/2022 Left 12/27/2022 Rt:  01/08/23  Hip flexion   4  Hip extension     Hip abduction     Hip adduction     Hip internal rotation     Hip external rotation     Knee flexion   3  Knee extension    3  Ankle dorsiflexion   4  Ankle plantarflexion     Ankle inversion     Ankle eversion      (Blank rows = not tested)   12/27/2022 Ambulation with RW mod I; decreased Rt hip/knee flexion, decreased stance on Rt                                                                                                                                                                        TODAY'S TREATMENT DATE 01/08/2023 TherEx: Scifit bike: rocking back and forth x 6 minutes seat at 7 Sit to stand 3 x 5 gradually moving pt's Rt foot back for increased Rt knee flexion LAQ: 2 x 10 AAROM: knee flexion in sitting, using left LE to push back on Rt  Supine: heel slides: x 10 holding 5 sec end range Supine: SLR: 2 x 10, pt began with mild extensor lag, after instructions to perform quad set and df before trying to lift her lag improved.  Attempted supine bridge, Pt unable to perform Hamstring sets in supine: x 10 holding 5 sec Manual:  PROM in seated flexion to pt's tolerance, also performed hip and knee flexion in supine Supine knee extension with over pressure  Modalites:  Vasopneumatic: medium compression, 34 deg x 10 minutes, LE elevated on bolster    TODAY'S TREATMENT DATE 01/01/2023 TherEx: Nustep: Level 3 x 10 minutes, seat 9, UE/LE Standing calf stretch on slant board: x 2 holding 30 sec LAQ: 2 x 10 AAROM: knee flexion in sitting, using left LE to push back on Rt  Supine: heel slides: x 10 holding 5 sec end range Supine: SLR: 2 x 10, pt began with mild extensor lag, after instructions to perform quad set and df before trying to lift her lag improved.  Supine hamstring stretch 3 holding 30 sec Modalites:  Vasopneumatic: medium compression, 34 deg x 10 minutes, LE elevated on bolster Manual:  PROM in seated flexion to pt's  tolerance      TODAY'S TREATMENT DATE 12/27/2022 TherEx: HEP instruction/performance c cues for techniques, handout provided.  Trial set performed of each  for comprehension and symptom assessment.  See below for exercise list  PATIENT EDUCATION:  12/27/2022 Education details: HEP, POC Person educated: Patient Education method: Explanation, Demonstration, Verbal cues, and Handouts Education comprehension: verbalized understanding, returned demonstration, and verbal cues required  HOME EXERCISE PROGRAM: Access Code: [email or web address] URL: [email or web address] Date: 12/27/2022 Prepared by: Moshe Cipro  Exercises - Quad Set  - 3-5 x daily - 7 x weekly - 1 sets - 10 reps - 5 sec hold - Supine Heel Slide with Strap  - 5-10 x daily - 7 x weekly - 1 sets - 5-10 reps - Seated Heel Slide  - 3-5 x daily - 7 x weekly - 1 sets - 5-10 reps - 5-10 sec hold - Seated Knee Flexion AAROM  - 3-5 x daily - 7 x weekly - 1 sets - 5-10 reps - 5-10 sec hold  ASSESSMENT:  CLINICAL IMPRESSION: Pt arriving today reporting no pain at rest. Pt is currently amb with a rolling walker. Pt was able to improve her passive ROM this visit to 60 degrees. Pt still with limitations in flexion. We discussed increasing pt's frequency if her ROM didn't improve by next visit. Pt and her daughter, Elita Quick reporting understanding. Pt motivated to get off her RW and reporting willingness to work more on her flexion at home. Pt also encouraged to continue with ice and elevation due to increased swelling noted. Recommend continued skilled PT interventions.   OBJECTIVE IMPAIRMENTS: Abnormal gait, decreased activity tolerance, decreased knowledge of use of DME, decreased mobility, difficulty walking, decreased ROM, decreased strength, hypomobility, increased edema, increased fascial restrictions, increased muscle spasms, impaired flexibility, and pain.   ACTIVITY LIMITATIONS: carrying, lifting, bending, sitting, standing, sleeping, stairs, transfers, and locomotion level  PARTICIPATION LIMITATIONS: meal prep, cleaning, medication management, driving, shopping, and community  activity  PERSONAL FACTORS: Age and 3+ comorbidities: GERD, HTN, osteopenia  are also affecting patient's functional outcome.   REHAB POTENTIAL: Good  CLINICAL DECISION MAKING: Evolving/moderate complexity  EVALUATION COMPLEXITY: Moderate   GOALS: Goals reviewed with patient? Yes  SHORT TERM GOALS: Target Date 01/24/2023   1.  Patient will demonstrate independent use of home exercise program to maintain progress from in clinic treatments. Goal status: on-going 01/01/23  2.  Patient will demonstrate Rt knee AROM 0-60 deg to facilitate usual daily mobility, transfers, and ambulation at PLOF.  Goal status: on-going 01/01/23  LONG TERM GOALS: Target Date 02/21/2023   1. Patient will demonstrate/report pain at worst less than or equal to 2/10 to facilitate minimal limitation in daily activity secondary to pain symptoms. Goal status: New   2. Patient will demonstrate independent use of home exercise program to facilitate ability to maintain/progress functional gains from skilled physical therapy services. Goal status: New   3. Patient will demonstrate FOTO outcome > or = 62 % to indicate reduced disability due to condition. Goal status: New   4.  Patient will demonstrate Rt LE MMT 4/5 throughout to faciltiate usual transfers, stairs, squatting at [facility] for daily life.  Goal status: New   5.  Patient will demonstrate Rt knee AROM 0-100 deg to facilitate usual daily mobility, transfers, and ambulation at PLOF.  Goal status: New   6.  Patient will demonstrate independent ambulation within community distances > 300 ft.  Goal status: New   PLAN:  PT FREQUENCY: 2x/week  PT  DURATION: 8 weeks  PLANNED INTERVENTIONS: Therapeutic exercises, Therapeutic activity, Neuro Muscular re-education, Balance training, Gait training, Patient/Family education, Joint mobilization, Stair training, DME instructions, Dry Needling, Electrical stimulation, Traction, Cryotherapy, vasopneumatic deviceMoist  heat, Taping, Ultrasound, Ionotophoresis 4mg /ml Dexamethasone, and aquatic therapy, Manual therapy.  All included unless contraindicated  PLAN FOR NEXT SESSION: Review HEP, aggressive flexion focus, vaso PRN Resend cert for 3 x/ week if pt's active ROM has not improved to at least 70 degrees by next visit.   Narda Amber, PT, MPT 01/08/23 10:45 AM   01/08/23 10:45 AM      Date of referral: 12/12/2022 Referring provider: Kathryne Hitch, MD Referring diagnosis? M17.11 (ICD-10-CM) - Primary osteoarthritis of right knee Treatment diagnosis? (if different than referring diagnosis) M25.561  What was this (referring dx) caused by? Surgery (Type: Rt TKA)  Nature of Condition: Initial Onset (within last 3 months)   Laterality: Rt  Current Functional Measure Score: FOTO 43  Objective measurements identify impairments when they are compared to normal values, the uninvolved extremity, and prior level of function.  [x]  Yes  []  No  Objective assessment of functional ability: Moderate functional limitations   Briefly describe symptoms: knee pain and stiffness following TKA  How did symptoms start: Surgery performed on 12/14/2022  Average pain intensity:  Last 24 hours: 5  Past week: 9  How often does the pt experience symptoms? Constantly  How much have the symptoms interfered with usual daily activities? Quite a bit  How has condition changed since care began at this facility? NA - initial visit  In general, how is the patients overall health? Good  (Back screen not indicated due to TKA)

## 2023-01-10 ENCOUNTER — Encounter: Payer: Self-pay | Admitting: Physical Therapy

## 2023-01-10 ENCOUNTER — Ambulatory Visit: Payer: Medicare Other | Admitting: Physical Therapy

## 2023-01-10 DIAGNOSIS — M6281 Muscle weakness (generalized): Secondary | ICD-10-CM

## 2023-01-10 DIAGNOSIS — R600 Localized edema: Secondary | ICD-10-CM

## 2023-01-10 DIAGNOSIS — G8929 Other chronic pain: Secondary | ICD-10-CM

## 2023-01-10 DIAGNOSIS — M25561 Pain in right knee: Secondary | ICD-10-CM | POA: Diagnosis not present

## 2023-01-10 DIAGNOSIS — R262 Difficulty in walking, not elsewhere classified: Secondary | ICD-10-CM

## 2023-01-10 DIAGNOSIS — M25661 Stiffness of right knee, not elsewhere classified: Secondary | ICD-10-CM | POA: Diagnosis not present

## 2023-01-10 NOTE — Therapy (Signed)
OUTPATIENT PHYSICAL THERAPY TREATMENT   Patient Name: Candace Bell MRN: [id number] DOB:08/28/1950, 72 y.o., female Today's Date: 01/10/2023  END OF SESSION:  PT End of Session - 01/10/23 0845     Visit Number 4    Number of Visits 16    Date for PT Re-Evaluation 02/21/23    Authorization Type [employer] Medicare $20 copay    Progress Note Due on Visit 10    PT Start Time 0845    PT Stop Time 0933    PT Time Calculation (min) 48 min    Activity Tolerance Patient tolerated treatment well    Behavior During Therapy [facility] for tasks assessed/performed               Past Medical History:  Diagnosis Date   Arthritis    generalized   Cataract 2015   bilateral sx   GERD (gastroesophageal reflux disease)    with certain foods   Hypertension    on meds   Osteopenia 10/13/2015   Past Surgical History:  Procedure Laterality Date   ABDOMINAL HYSTERECTOMY  04/10/1991   TAH --took one ovary   BACK SURGERY  2002   BUNIONECTOMY WITH HAMMERTOE RECONSTRUCTION Right 2014   CATARACT EXTRACTION Bilateral 04/09/2013   COLONOSCOPY  2015   MS-MAC-movi(exc)-ulcerated polyp   right wrist surgery due to fracture     TOTAL KNEE ARTHROPLASTY Right 12/14/2022   Procedure: RIGHT TOTAL KNEE ARTHROPLASTY;  Surgeon: Kathryne Hitch, MD;  Location: [facility];  Service: Orthopedics;  Laterality: Right;   Patient Active Problem List   Diagnosis Date Noted   Status post total right knee replacement 12/14/2022   Preventative health care 07/03/2021   Upper extremity weakness 11/18/2020   Hypertension    Arthritis    Other fatigue 03/07/2020   Loss of appetite 03/07/2020   SOB (shortness of breath) 03/07/2020   Depression, major, single episode, mild (HCC) 03/07/2020   Left hand pain 10/21/2018   Hyperlipidemia LDL goal <100 01/26/2018   Memory loss 04/16/2017   Osteopenia 10/13/2015   Back pain 11/25/2013   Essential hypertension 02/29/2012    PCP: Jackelyn Poling DO  REFERRING PROVIDER:  Kathryne Hitch, MD  REFERRING DIAG: M17.11 (ICD-10-CM) - Primary osteoarthritis of right knee  THERAPY DIAG:  Chronic pain of right knee  Stiffness of right knee, not elsewhere classified  Muscle weakness (generalized)  Difficulty in walking, not elsewhere classified  Localized edema  Rationale for Evaluation and Treatment: Rehabilitation  ONSET DATE:  12/14/2022 surgery date  SUBJECTIVE:   SUBJECTIVE STATEMENT: Pt arrives not having pain at the moment upon arrival, expresses pain with bending her knee though.   PERTINENT HISTORY: GERD, HTN, osteopenia  PAIN:  NPRS scale: no pain at rest, worse pain 5/10 with flexion Pain location: Rt knee Pain description: sharp Aggravating factors: walking, bending the knee Relieving factors: rest, medication  PRECAUTIONS: None  WEIGHT BEARING RESTRICTIONS: No  FALLS:  Has patient fallen in last 6 months? No  LIVING ENVIRONMENT: Lives with: lives with their daughter Lives in: House/apartment Stairs: No; has one curb step to enter Has following equipment at home: RW, [facility]  OCCUPATION: retired from [employer]  PLOF: Independent  PATIENT GOALS: get up and take care of self   OBJECTIVE:   PATIENT SURVEYS:  12/27/2022 FOTO intake: 43   predicted:  62  COGNITION: 12/27/2022 Overall cognitive status: WFL    SENSATION: 12/27/2022 WFL  EDEMA:  12/27/2022 Circumferential: Knee joint line: Rt: 49.2 cm   /  Lt: 41.8 cm     LOWER EXTREMITY ROM:    12/27/22: significant guarding with flexion PROM   ROM Right 12/27/2022 Rt 01/01/23 Rt 01/08/23 Rt 01/10/23  Knee flexion A: 30 P: 38 A: 44 P: 48 A: 55 P; 60 P:70  Knee extension A: -14 (seated LAQ) P: 0 A: -10 P: 0 A: -8 P: -2    (Blank rows = not tested)  LOWER EXTREMITY MMT:  Not formally measured; Rt knee grossly 3-/5  MMT Right 12/27/2022 Left 12/27/2022 Rt:  01/08/23  Hip flexion   4  Hip extension     Hip abduction     Hip adduction     Hip internal  rotation     Hip external rotation     Knee flexion   3  Knee extension   3  Ankle dorsiflexion   4  Ankle plantarflexion     Ankle inversion     Ankle eversion      (Blank rows = not tested)   12/27/2022 Ambulation with RW mod I; decreased Rt hip/knee flexion, decreased stance on Rt                                                                                                                                                                        TODAY'S TREATMENT DATE 01/10/2023 TherEx: Nu step L5, seat #8 UE/LE X 10 min Standing knee flexion lunge stretch with Rt foot on 8 inch step using bars for UE support 5 sec hold X 10 Leg press Rt leg only 25# with 5 sec hold at max flexion X 15 LAQ: seated on raised mat table with 2# weight, 20 reps holding 5 sec at max flexion Manual:  PROM with overpressure, mix of high load short duration stretch and low load longer duration stretching for seated knee flexion to pt's tolerance  01/08/2023 TherEx: Scifit bike: rocking back and forth x 6 minutes seat at 7 Sit to stand 3 x 5 gradually moving pt's Rt foot back for increased Rt knee flexion LAQ: 2 x 10 AAROM: knee flexion in sitting, using left LE to push back on Rt  Supine: heel slides: x 10 holding 5 sec end range Supine: SLR: 2 x 10, pt began with mild extensor lag, after instructions to perform quad set and df before trying to lift her lag improved.  Attempted supine bridge, Pt unable to perform Hamstring sets in supine: x 10 holding 5 sec Manual:  PROM in seated flexion to pt's tolerance, also performed hip and knee flexion in supine Supine knee extension with over pressure  Modalites:  Vasopneumatic: medium compression, 34 deg x 10 minutes, LE elevated on bolster    TODAY'S TREATMENT DATE 01/01/2023 TherEx: Nustep:  Level 3 x 10 minutes, seat 9, UE/LE Standing calf stretch on slant board: x 2 holding 30 sec LAQ: 2 x 10 AAROM: knee flexion in sitting, using left LE to push  back on Rt  Supine: heel slides: x 10 holding 5 sec end range Supine: SLR: 2 x 10, pt began with mild extensor lag, after instructions to perform quad set and df before trying to lift her lag improved.  Supine hamstring stretch 3 holding 30 sec Modalites:  Vasopneumatic: medium compression, 34 deg x 10 minutes, LE elevated on bolster Manual:  PROM in seated flexion to pt's tolerance   TODAY'S TREATMENT DATE 12/27/2022 TherEx: HEP instruction/performance c cues for techniques, handout provided.  Trial set performed of each for comprehension and symptom assessment.  See below for exercise list  PATIENT EDUCATION:  12/27/2022 Education details: HEP, POC Person educated: Patient Education method: Explanation, Demonstration, Verbal cues, and Handouts Education comprehension: verbalized understanding, returned demonstration, and verbal cues required  HOME EXERCISE PROGRAM: Access Code: [email or web address] URL: [email or web address] Date: 12/27/2022 Prepared by: Moshe Cipro  Exercises - Quad Set  - 3-5 x daily - 7 x weekly - 1 sets - 10 reps - 5 sec hold - Supine Heel Slide with Strap  - 5-10 x daily - 7 x weekly - 1 sets - 5-10 reps - Seated Heel Slide  - 3-5 x daily - 7 x weekly - 1 sets - 5-10 reps - 5-10 sec hold - Seated Knee Flexion AAROM  - 3-5 x daily - 7 x weekly - 1 sets - 5-10 reps - 5-10 sec hold  ASSESSMENT:  CLINICAL IMPRESSION: Session focused on aggressive knee flexion ROM to tolerance, still with severe deficit with this so I did send out new PT cert to increase the frequency of PT to 3 times per week in efforts to maximize her ROM.  On a positive note she did show improved measurement today for knee flexion and is also working very hard in PT and at home.  OBJECTIVE IMPAIRMENTS: Abnormal gait, decreased activity tolerance, decreased knowledge of use of DME, decreased mobility, difficulty walking, decreased ROM, decreased strength, hypomobility, increased  edema, increased fascial restrictions, increased muscle spasms, impaired flexibility, and pain.   ACTIVITY LIMITATIONS: carrying, lifting, bending, sitting, standing, sleeping, stairs, transfers, and locomotion level  PARTICIPATION LIMITATIONS: meal prep, cleaning, medication management, driving, shopping, and community activity  PERSONAL FACTORS: Age and 3+ comorbidities: GERD, HTN, osteopenia  are also affecting patient's functional outcome.   REHAB POTENTIAL: Good  CLINICAL DECISION MAKING: Evolving/moderate complexity  EVALUATION COMPLEXITY: Moderate   GOALS: Goals reviewed with patient? Yes  SHORT TERM GOALS: Target Date 01/24/2023   1.  Patient will demonstrate independent use of home exercise program to maintain progress from in clinic treatments. Goal status: on-going 01/01/23  2.  Patient will demonstrate Rt knee AROM 0-60 deg to facilitate usual daily mobility, transfers, and ambulation at PLOF.  Goal status: on-going 01/01/23  LONG TERM GOALS: Target Date 02/21/2023   1. Patient will demonstrate/report pain at worst less than or equal to 2/10 to facilitate minimal limitation in daily activity secondary to pain symptoms. Goal status: New   2. Patient will demonstrate independent use of home exercise program to facilitate ability to maintain/progress functional gains from skilled physical therapy services. Goal status: New   3. Patient will demonstrate FOTO outcome > or = 62 % to indicate reduced disability due to condition. Goal status: New   4.  Patient will demonstrate Rt LE  MMT 4/5 throughout to faciltiate usual transfers, stairs, squatting at [facility] for daily life.  Goal status: New   5.  Patient will demonstrate Rt knee AROM 0-100 deg to facilitate usual daily mobility, transfers, and ambulation at PLOF.  Goal status: New   6.  Patient will demonstrate independent ambulation within community distances > 300 ft.  Goal status: New   PLAN:  PT FREQUENCY:  2-3x/week  PT DURATION: 8 weeks  PLANNED INTERVENTIONS: Therapeutic exercises, Therapeutic activity, Neuro Muscular re-education, Balance training, Gait training, Patient/Family education, Joint mobilization, Stair training, DME instructions, Dry Needling, Electrical stimulation, Traction, Cryotherapy, vasopneumatic deviceMoist heat, Taping, Ultrasound, Ionotophoresis 4mg /ml Dexamethasone, and aquatic therapy, Manual therapy.  All included unless contraindicated  PLAN FOR NEXT SESSION: Review HEP, aggressive flexion focus, vaso PRN   Ivery Quale, PT, DPT 01/10/23 8:51 AM       Date of referral: 12/12/2022 Referring provider: Kathryne Hitch, MD Referring diagnosis? M17.11 (ICD-10-CM) - Primary osteoarthritis of right knee Treatment diagnosis? (if different than referring diagnosis) M25.561  What was this (referring dx) caused by? Surgery (Type: Rt TKA)  Nature of Condition: Initial Onset (within last 3 months)   Laterality: Rt  Current Functional Measure Score: FOTO 43  Objective measurements identify impairments when they are compared to normal values, the uninvolved extremity, and prior level of function.  [x]  Yes  []  No  Objective assessment of functional ability: Moderate functional limitations   Briefly describe symptoms: knee pain and stiffness following TKA  How did symptoms start: Surgery performed on 12/14/2022  Average pain intensity:  Last 24 hours: 5  Past week: 9  How often does the pt experience symptoms? Constantly  How much have the symptoms interfered with usual daily activities? Quite a bit  How has condition changed since care began at this facility? NA - initial visit  In general, how is the patients overall health? Good  (Back screen not indicated due to TKA)

## 2023-01-11 ENCOUNTER — Other Ambulatory Visit: Payer: Self-pay | Admitting: Family Medicine

## 2023-01-11 ENCOUNTER — Telehealth: Payer: Self-pay | Admitting: *Deleted

## 2023-01-11 ENCOUNTER — Other Ambulatory Visit: Payer: Self-pay | Admitting: Orthopaedic Surgery

## 2023-01-11 DIAGNOSIS — I10 Essential (primary) hypertension: Secondary | ICD-10-CM

## 2023-01-11 MED ORDER — OXYCODONE HCL 5 MG PO TABS: 5.0000 mg | ORAL_TABLET | Freq: Four times a day (QID) | ORAL | 0 refills | Status: DC | PRN

## 2023-01-11 NOTE — Telephone Encounter (Signed)
Patient called requesting a refill of pain medication. Thank you.

## 2023-01-14 ENCOUNTER — Ambulatory Visit: Payer: Medicare Other | Admitting: Rehabilitative and Restorative Service Providers"

## 2023-01-14 ENCOUNTER — Encounter: Payer: Self-pay | Admitting: Physical Therapy

## 2023-01-14 ENCOUNTER — Other Ambulatory Visit: Payer: Self-pay | Admitting: Family Medicine

## 2023-01-14 DIAGNOSIS — R1013 Epigastric pain: Secondary | ICD-10-CM

## 2023-01-14 DIAGNOSIS — R600 Localized edema: Secondary | ICD-10-CM

## 2023-01-14 DIAGNOSIS — R262 Difficulty in walking, not elsewhere classified: Secondary | ICD-10-CM

## 2023-01-14 DIAGNOSIS — G8929 Other chronic pain: Secondary | ICD-10-CM | POA: Diagnosis not present

## 2023-01-14 DIAGNOSIS — M6281 Muscle weakness (generalized): Secondary | ICD-10-CM | POA: Diagnosis not present

## 2023-01-14 DIAGNOSIS — M25561 Pain in right knee: Secondary | ICD-10-CM | POA: Diagnosis not present

## 2023-01-14 DIAGNOSIS — M25661 Stiffness of right knee, not elsewhere classified: Secondary | ICD-10-CM | POA: Diagnosis not present

## 2023-01-14 NOTE — Therapy (Signed)
**Note DeCandaceIdentified via Obfuscation** OUTPATIENT PHYSICAL THERAPY TREATMENT   Patient Name: Candace Bell MRN: [id number] DOB:January 19, 1951, 72 y.o., female Today's Date: 01/14/2023  END OF SESSION:  PT End of Session - 01/14/23 1442     Visit Number 5    Number of Visits 16    Date for PT ReCandaceEvaluation 02/21/23    Authorization Type [employer] $20 copay    Progress Note Due on Visit 10    PT Start Time 1430    PT Stop Time 1515    PT Time Calculation (min) 45 min    Activity Tolerance Patient tolerated treatment well    Behavior During Therapy [facility] for tasks assessed/performed               Past Medical History:  Diagnosis Date   Arthritis    generalized   Cataract 2015   bilateral sx   GERD (gastroesophageal reflux disease)    with certain foods   Hypertension    on meds   Osteopenia 10/13/2015   Past Surgical History:  Procedure Laterality Date   ABDOMINAL HYSTERECTOMY  04/10/1991   TAH --took one ovary   BACK SURGERY  2002   BUNIONECTOMY WITH HAMMERTOE RECONSTRUCTION Right 2014   CATARACT EXTRACTION Bilateral 04/09/2013   COLONOSCOPY  2015   MSCandaceMACCandacemovi(exc)-ulcerated polyp   right wrist surgery due to fracture     TOTAL KNEE ARTHROPLASTY Right 12/14/2022   Procedure: RIGHT TOTAL KNEE ARTHROPLASTY;  Surgeon: Kathryne Hitch, MD;  Location: [facility];  Service: Orthopedics;  Laterality: Right;   Patient Active Problem List   Diagnosis Date Noted   Status post total right knee replacement 12/14/2022   Preventative health care 07/03/2021   Upper extremity weakness 11/18/2020   Hypertension    Arthritis    Other fatigue 03/07/2020   Loss of appetite 03/07/2020   SOB (shortness of breath) 03/07/2020   Depression, major, single episode, mild (HCC) 03/07/2020   Left hand pain 10/21/2018   Hyperlipidemia LDL goal <100 01/26/2018   Memory loss 04/16/2017   Osteopenia 10/13/2015   Back pain 11/25/2013   Essential hypertension 02/29/2012    PCP: Jackelyn Poling DO  REFERRING PROVIDER:  Kathryne Hitch, MD  REFERRING DIAG: M17.11 (ICDCandace10CandaceCM) - Primary osteoarthritis of right knee  THERAPY DIAG:  Chronic pain of right knee  Stiffness of right knee, not elsewhere classified  Muscle weakness (generalized)  Difficulty in walking, not elsewhere classified  Localized edema  Rationale for Evaluation and Treatment: Rehabilitation  ONSET DATE:  12/14/2022 surgery date  SUBJECTIVE:   SUBJECTIVE STATEMENT: Pt arrives not having pain at the moment and has been working hard on her stretching program at home for ROM PERTINENT HISTORY: GERD, HTN, osteopenia  PAIN:  NPRS scale: no pain at rest, worse pain 5/10 with flexion Pain location: Rt knee Pain description: sharp Aggravating factors: walking, bending the knee Relieving factors: rest, medication  PRECAUTIONS: None  WEIGHT BEARING RESTRICTIONS: No  FALLS:  Has patient fallen in last 6 months? No  LIVING ENVIRONMENT: Lives with: lives with their daughter Lives in: House/apartment Stairs: No; has one curb step to enter Has following equipment at home: RW, [facility]  OCCUPATION: retired from [employer]  PLOF: Independent  PATIENT GOALS: get up and take care of self   OBJECTIVE:   PATIENT SURVEYS:  12/27/2022 FOTO intake: 43   predicted:  62  COGNITION: 12/27/2022 Overall cognitive status: WFL    SENSATION: 12/27/2022 WFL  EDEMA:  12/27/2022 Circumferential: Knee joint line: Rt: 49.2  cm   /Lt: 41.8 cm     LOWER EXTREMITY ROM:    12/27/22: significant guarding with flexion PROM   ROM Right 12/27/2022 Rt 01/01/23 Rt 01/08/23 Rt 01/10/23 Rt 01/14/23  Knee flexion A: 30 P: 38 A: 44 P: 48 A: 55 P; 60 P:70 P:78  Knee extension A: -14 (seated LAQ) P: 0 A: -10 P: 0 A: -8 P: -2     (Blank rows = not tested)  LOWER EXTREMITY MMT:  Not formally measured; Rt knee grossly 3-/5  MMT Right 12/27/2022 Left 12/27/2022 Rt:  01/08/23  Hip flexion   4  Hip extension     Hip abduction     Hip adduction      Hip internal rotation     Hip external rotation     Knee flexion   3  Knee extension   3  Ankle dorsiflexion   4  Ankle plantarflexion     Ankle inversion     Ankle eversion      (Blank rows = not tested)   12/27/2022 Ambulation with RW mod I; decreased Rt hip/knee flexion, decreased stance on Rt                                                                                                                                                                        TODAY'S TREATMENT DATE 01/14/2023 TherEx: Nu step L5, seat #8 UE/LE X 10 min Standing knee flexion lunge stretch with Rt foot on 8 inch step using bars for UE support 5 sec hold X 10 Leg press Rt leg only 31# with 5 sec hold at max flexion X 15 Step ups onto 6 inch step with one UE support, leading with Rt leg X 10 forward and X 10 lateral Supine knee flexion AAROM with stap and foot on ball 5 sec X 10 Manual:  PROM with overpressure, mix of high load short duration stretch and low load longer duration stretching for seated knee flexion to pt's tolerance  01/10/2023 TherEx: Nu step L5, seat #8 UE/LE X 10 min Standing knee flexion lunge stretch with Rt foot on 8 inch step using bars for UE support 5 sec hold X 10 Leg press Rt leg only 25# with 5 sec hold at max flexion X 15 LAQ: seated on raised mat table with 2# weight, 20 reps holding 5 sec at max flexion Manual:  PROM with overpressure, mix of high load short duration stretch and low load longer duration stretching for seated knee flexion to pt's tolerance  01/08/2023 TherEx: Scifit bike: rocking back and forth x 6 minutes seat at 7 Sit to stand 3 x 5 gradually moving pt's Rt foot back for increased Rt knee flexion LAQ: 2  x 10 AAROM: knee flexion in sitting, using left LE to push back on Rt  Supine: heel slides: x 10 holding 5 sec end range Supine: SLR: 2 x 10, pt began with mild extensor lag, after instructions to perform quad set and df before trying to lift her  lag improved.  Attempted supine bridge, Pt unable to perform Hamstring sets in supine: x 10 holding 5 sec Manual:  PROM in seated flexion to pt's tolerance, also performed hip and knee flexion in supine Supine knee extension with over pressure  Modalites:  Vasopneumatic: medium compression, 34 deg x 10 minutes, LE elevated on bolster    TODAY'S TREATMENT DATE 01/01/2023 TherEx: Nustep: Level 3 x 10 minutes, seat 9, UE/LE Standing calf stretch on slant board: x 2 holding 30 sec LAQ: 2 x 10 AAROM: knee flexion in sitting, using left LE to push back on Rt  Supine: heel slides: x 10 holding 5 sec end range Supine: SLR: 2 x 10, pt began with mild extensor lag, after instructions to perform quad set and df before trying to lift her lag improved.  Supine hamstring stretch 3 holding 30 sec Modalites:  Vasopneumatic: medium compression, 34 deg x 10 minutes, LE elevated on bolster Manual:  PROM in seated flexion to pt's tolerance   TODAY'S TREATMENT DATE 12/27/2022 TherEx: HEP instruction/performance c cues for techniques, handout provided.  Trial set performed of each for comprehension and symptom assessment.  See below for exercise list  PATIENT EDUCATION:  12/27/2022 Education details: HEP, POC Person educated: Patient Education method: Explanation, Demonstration, Verbal cues, and Handouts Education comprehension: verbalized understanding, returned demonstration, and verbal cues required  HOME EXERCISE PROGRAM: Access Code: [email or web address] URL: [email or web address] Date: 12/27/2022 Prepared by: Moshe Cipro  Exercises - Quad Set  - 3Candace5 x daily - 7 x weekly - 1 sets - 10 reps - 5 sec hold - Supine Heel Slide with Strap  - 5Candace10 x daily - 7 x weekly - 1 sets - 5Candace10 reps - Seated Heel Slide  - 3Candace5 x daily - 7 x weekly - 1 sets - 5Candace10 reps - 5Candace10 sec hold - Seated Knee Flexion AAROM  - 3Candace5 x daily - 7 x weekly - 1 sets - 5Candace10 reps - 5Candace10 sec  hold  ASSESSMENT:  CLINICAL IMPRESSION: We continued to focus on aggressive knee flexion ROM to her tolerance, she did tolerate this well and again showed improved knee ROM measurement today.  OBJECTIVE IMPAIRMENTS: Abnormal gait, decreased activity tolerance, decreased knowledge of use of DME, decreased mobility, difficulty walking, decreased ROM, decreased strength, hypomobility, increased edema, increased fascial restrictions, increased muscle spasms, impaired flexibility, and pain.   ACTIVITY LIMITATIONS: carrying, lifting, bending, sitting, standing, sleeping, stairs, transfers, and locomotion level  PARTICIPATION LIMITATIONS: meal prep, cleaning, medication management, driving, shopping, and community activity  PERSONAL FACTORS: Age and 3+ comorbidities: GERD, HTN, osteopenia  are also affecting patient's functional outcome.   REHAB POTENTIAL: Good  CLINICAL DECISION MAKING: Evolving/moderate complexity  EVALUATION COMPLEXITY: Moderate   GOALS: Goals reviewed with patient? Yes  SHORT TERM GOALS: Target Date 01/24/2023   1.  Patient will demonstrate independent use of home exercise program to maintain progress from in clinic treatments. Goal status: onCandacegoing 01/01/23  2.  Patient will demonstrate Rt knee AROM 0Candace60 deg to facilitate usual daily mobility, transfers, and ambulation at PLOF.  Goal status: onCandacegoing 01/01/23  LONG TERM GOALS: Target Date 02/21/2023   1. Patient will demonstrate/report pain at worst less than or  equal to 2/10 to facilitate minimal limitation in daily activity secondary to pain symptoms. Goal status: New   2. Patient will demonstrate independent use of home exercise program to facilitate ability to maintain/progress functional gains from skilled physical therapy services. Goal status: New   3. Patient will demonstrate FOTO outcome > or = 62 % to indicate reduced disability due to condition. Goal status: New   4.  Patient will demonstrate Rt LE  MMT 4/5 throughout to faciltiate usual transfers, stairs, squatting at [facility] for daily life.  Goal status: New   5.  Patient will demonstrate Rt knee AROM 0Candace100 deg to facilitate usual daily mobility, transfers, and ambulation at PLOF.  Goal status: New   6.  Patient will demonstrate independent ambulation within community distances > 300 ft.  Goal status: New   PLAN:  PT FREQUENCY: 2Candace3x/week  PT DURATION: 8 weeks  PLANNED INTERVENTIONS: Therapeutic exercises, Therapeutic activity, Neuro Muscular reCandaceeducation, Balance training, Gait training, Patient/Family education, Joint mobilization, Stair training, DME instructions, Dry Needling, Electrical stimulation, Traction, Cryotherapy, vasopneumatic deviceMoist heat, Taping, Ultrasound, Ionotophoresis 4mg /ml Dexamethasone, and aquatic therapy, Manual therapy.  All included unless contraindicated  PLAN FOR NEXT SESSION: Review HEP, aggressive flexion focus as tolerated   Ivery Quale, PT, DPT 01/14/23 2:42 PM       Date of referral: 12/12/2022 Referring provider: Kathryne Hitch, MD Referring diagnosis? M17.11 (ICDCandace10CandaceCM) - Primary osteoarthritis of right knee Treatment diagnosis? (if different than referring diagnosis) M25.561  What was this (referring dx) caused by? Surgery (Type: Rt TKA)  Nature of Condition: Initial Onset (within last 3 months)   Laterality: Rt  Current Functional Measure Score: FOTO 43  Objective measurements identify impairments when they are compared to normal values, the uninvolved extremity, and prior level of function.  [x]  Yes  []  No  Objective assessment of functional ability: Moderate functional limitations   Briefly describe symptoms: knee pain and stiffness following TKA  How did symptoms start: Surgery performed on 12/14/2022  Average pain intensity:  Last 24 hours: 5  Past week: 9  How often does the pt experience symptoms? Constantly  How much have the symptoms interfered with  usual daily activities? Quite a bit  How has condition changed since care began at this facility? NA - initial visit  In general, how is the patients overall health? Good  (Back screen not indicated due to TKA)

## 2023-01-15 ENCOUNTER — Ambulatory Visit: Payer: Medicare Other | Admitting: Physical Therapy

## 2023-01-15 ENCOUNTER — Encounter: Payer: Self-pay | Admitting: Physical Therapy

## 2023-01-15 DIAGNOSIS — R262 Difficulty in walking, not elsewhere classified: Secondary | ICD-10-CM | POA: Diagnosis not present

## 2023-01-15 DIAGNOSIS — G8929 Other chronic pain: Secondary | ICD-10-CM

## 2023-01-15 DIAGNOSIS — M25561 Pain in right knee: Secondary | ICD-10-CM | POA: Diagnosis not present

## 2023-01-15 DIAGNOSIS — M6281 Muscle weakness (generalized): Secondary | ICD-10-CM

## 2023-01-15 DIAGNOSIS — M25661 Stiffness of right knee, not elsewhere classified: Secondary | ICD-10-CM

## 2023-01-15 DIAGNOSIS — R600 Localized edema: Secondary | ICD-10-CM

## 2023-01-15 NOTE — Therapy (Signed)
OUTPATIENT PHYSICAL THERAPY TREATMENT   Patient Name: Candace Bell MRN: [id number] DOB:04-Apr-1951, 72 y.o., female Today's Date: 01/15/2023  END OF SESSION:  PT End of Session - 01/15/23 0807     Visit Number 6    Number of Visits 16    Date for PT Re-Evaluation 02/21/23    Progress Note Due on Visit 10    PT Start Time 0803    PT Stop Time 0854    PT Time Calculation (min) 51 min    Activity Tolerance Patient tolerated treatment well    Behavior During Therapy [facility] for tasks assessed/performed               Past Medical History:  Diagnosis Date   Arthritis    generalized   Cataract 2015   bilateral sx   GERD (gastroesophageal reflux disease)    with certain foods   Hypertension    on meds   Osteopenia 10/13/2015   Past Surgical History:  Procedure Laterality Date   ABDOMINAL HYSTERECTOMY  04/10/1991   TAH --took one ovary   BACK SURGERY  2002   BUNIONECTOMY WITH HAMMERTOE RECONSTRUCTION Right 2014   CATARACT EXTRACTION Bilateral 04/09/2013   COLONOSCOPY  2015   MS-MAC-movi(exc)-ulcerated polyp   right wrist surgery due to fracture     TOTAL KNEE ARTHROPLASTY Right 12/14/2022   Procedure: RIGHT TOTAL KNEE ARTHROPLASTY;  Surgeon: Kathryne Hitch, MD;  Location: [facility];  Service: Orthopedics;  Laterality: Right;   Patient Active Problem List   Diagnosis Date Noted   Status post total right knee replacement 12/14/2022   Preventative health care 07/03/2021   Upper extremity weakness 11/18/2020   Hypertension    Arthritis    Other fatigue 03/07/2020   Loss of appetite 03/07/2020   SOB (shortness of breath) 03/07/2020   Depression, major, single episode, mild (HCC) 03/07/2020   Left hand pain 10/21/2018   Hyperlipidemia LDL goal <100 01/26/2018   Memory loss 04/16/2017   Osteopenia 10/13/2015   Back pain 11/25/2013   Essential hypertension 02/29/2012    PCP: Jackelyn Poling DO  REFERRING PROVIDER: Kathryne Hitch, MD  REFERRING DIAG:  M17.11 (ICD-10-CM) - Primary osteoarthritis of right knee  THERAPY DIAG:  Chronic pain of right knee  Stiffness of right knee, not elsewhere classified  Muscle weakness (generalized)  Difficulty in walking, not elsewhere classified  Localized edema  Rationale for Evaluation and Treatment: Rehabilitation  ONSET DATE:  12/14/2022 surgery date  SUBJECTIVE:   SUBJECTIVE STATEMENT: Pt arriving today reporting no pain at rest only pain with Rt knee flexion.  PERTINENT HISTORY: GERD, HTN, osteopenia  PAIN:  NPRS scale: no pain at rest, 6/10 with flexion Pain location: Rt knee Pain description: sharp Aggravating factors: walking, bending the knee Relieving factors: rest, medication  PRECAUTIONS: None  WEIGHT BEARING RESTRICTIONS: No  FALLS:  Has patient fallen in last 6 months? No  LIVING ENVIRONMENT: Lives with: lives with their daughter Lives in: House/apartment Stairs: No; has one curb step to enter Has following equipment at home: RW, [facility]  OCCUPATION: retired from [employer]  PLOF: Independent  PATIENT GOALS: get up and take care of self   OBJECTIVE:   PATIENT SURVEYS:  12/27/2022 FOTO intake: 43   predicted:  62  COGNITION: 12/27/2022 Overall cognitive status: WFL    SENSATION: 12/27/2022 WFL  EDEMA:  12/27/2022 Circumferential: Knee joint line: Rt: 49.2 cm   /Lt: 41.8 cm     LOWER EXTREMITY ROM:    12/27/22:  significant guarding with flexion PROM   ROM Right 12/27/2022 Rt 01/01/23 Rt 01/08/23 Rt 01/10/23 Rt 01/14/23 Rt 01/15/23  Knee flexion A: 30 P: 38 A: 44 P: 48 A: 55 P; 60 P:70 P:78 A: 65 P: 74  supine  Knee extension A: -14 (seated LAQ) P: 0 A: -10 P: 0 A: -8 P: -2   A: -5 P: 0   (Blank rows = not tested)  LOWER EXTREMITY MMT:  Not formally measured; Rt knee grossly 3-/5  MMT Right 12/27/2022 Left 12/27/2022 Rt:  01/08/23  Hip flexion   4  Hip extension     Hip abduction     Hip adduction     Hip internal rotation     Hip external  rotation     Knee flexion   3  Knee extension   3  Ankle dorsiflexion   4  Ankle plantarflexion     Ankle inversion     Ankle eversion      (Blank rows = not tested)   12/27/2022 Ambulation with RW mod I; decreased Rt hip/knee flexion, decreased stance on Rt                                                                                                                                                                        TODAY'S TREATMENT DATE 01/15/2023 TherEx: Nu step L5, seat #8 UE/LE X 8 min Placing Rt foot on 6 inch step lunging forward and back x 10 holding at end range for 3-5 sec Step ups on 6 inch x 15 leading and lowering with Rt LE Leg press bil LE's 56# 2 x 10, Rt LE only: 31# 2 x 10  AA heel slides using stretch strap and foot on ball x 15 holding at end range x 15  Manual:  PROM with overpressure for extension in supine PROM knee flexion with hip flexion to 90 degrees pt's tolerance PROM knee flexion heel slide  Modalities:  Vasopneumatic: 34 deg, medium compression, LE elevated x 10 minutes    01/14/2023 TherEx: Nu step L5, seat #8 UE/LE X 10 min Standing knee flexion lunge stretch with Rt foot on 8 inch step using bars for UE support 5 sec hold X 10 Leg press Rt leg only 31# with 5 sec hold at max flexion X 15 Step ups onto 6 inch step with one UE support, leading with Rt leg X 10 forward and X 10 lateral Supine knee flexion AAROM with stap and foot on ball 5 sec X 10 Manual:  PROM with overpressure, mix of high load short duration stretch and low load longer duration stretching for seated knee flexion to pt's tolerance  01/10/2023 TherEx: Nu step L5, seat #8 UE/LE X  10 min Standing knee flexion lunge stretch with Rt foot on 8 inch step using bars for UE support 5 sec hold X 10 Leg press Rt leg only 25# with 5 sec hold at max flexion X 15 LAQ: seated on raised mat table with 2# weight, 20 reps holding 5 sec at max flexion Manual:  PROM with overpressure,  mix of high load short duration stretch and low load longer duration stretching for seated knee flexion to pt's tolerance  01/08/2023 TherEx: Scifit bike: rocking back and forth x 6 minutes seat at 7 Sit to stand 3 x 5 gradually moving pt's Rt foot back for increased Rt knee flexion LAQ: 2 x 10 AAROM: knee flexion in sitting, using left LE to push back on Rt  Supine: heel slides: x 10 holding 5 sec end range Supine: SLR: 2 x 10, pt began with mild extensor lag, after instructions to perform quad set and df before trying to lift her lag improved.  Attempted supine bridge, Pt unable to perform Hamstring sets in supine: x 10 holding 5 sec Manual:  PROM in seated flexion to pt's tolerance, also performed hip and knee flexion in supine Supine knee extension with over pressure  Modalites:  Vasopneumatic: medium compression, 34 deg x 10 minutes, LE elevated on bolster      PATIENT EDUCATION:  12/27/2022 Education details: HEP, POC Person educated: Patient Education method: Programmer, multimedia, Facilities manager, Verbal cues, and Handouts Education comprehension: verbalized understanding, returned demonstration, and verbal cues required  HOME EXERCISE PROGRAM: Access Code: [email or web address] URL: [email or web address] Date: 12/27/2022 Prepared by: Moshe Cipro  Exercises - Quad Set  - 3-5 x daily - 7 x weekly - 1 sets - 10 reps - 5 sec hold - Supine Heel Slide with Strap  - 5-10 x daily - 7 x weekly - 1 sets - 5-10 reps - Seated Heel Slide  - 3-5 x daily - 7 x weekly - 1 sets - 5-10 reps - 5-10 sec hold - Seated Knee Flexion AAROM  - 3-5 x daily - 7 x weekly - 1 sets - 5-10 reps - 5-10 sec hold  ASSESSMENT:  CLINICAL IMPRESSION: 01/15/2023: Pt sill with limitations in her Rt knee flexion. Pt's current passive knee flexion is 74 degrees. Pt and her daughter were instructed to perform knee flexion exercises at least once every hour throughout the day. Pt also instructed to take her baths  in the evening and to put on her compression stockings first thing when she gets out of bed every morning to prevent further swelling. Pt also instructed to ice and elevate as needed to help control the swelling. Recommend continued skilled PT interventions to maximize pt's function.     OBJECTIVE IMPAIRMENTS: Abnormal gait, decreased activity tolerance, decreased knowledge of use of DME, decreased mobility, difficulty walking, decreased ROM, decreased strength, hypomobility, increased edema, increased fascial restrictions, increased muscle spasms, impaired flexibility, and pain.   ACTIVITY LIMITATIONS: carrying, lifting, bending, sitting, standing, sleeping, stairs, transfers, and locomotion level  PARTICIPATION LIMITATIONS: meal prep, cleaning, medication management, driving, shopping, and community activity  PERSONAL FACTORS: Age and 3+ comorbidities: GERD, HTN, osteopenia  are also affecting patient's functional outcome.   REHAB POTENTIAL: Good  CLINICAL DECISION MAKING: Evolving/moderate complexity  EVALUATION COMPLEXITY: Moderate   GOALS: Goals reviewed with patient? Yes  SHORT TERM GOALS: Target Date 01/24/2023   1.  Patient will demonstrate independent use of home exercise program to maintain progress from in clinic treatments. Goal status: MET 01/15/23  2.  Patient will demonstrate Rt knee AROM 0-60 deg to facilitate usual daily mobility, transfers, and ambulation at PLOF.  Goal status: MET 01/15/23  LONG TERM GOALS: Target Date 02/21/2023   1. Patient will demonstrate/report pain at worst less than or equal to 2/10 to facilitate minimal limitation in daily activity secondary to pain symptoms. Goal status: New   2. Patient will demonstrate independent use of home exercise program to facilitate ability to maintain/progress functional gains from skilled physical therapy services. Goal status: New   3. Patient will demonstrate FOTO outcome > or = 62 % to indicate reduced  disability due to condition. Goal status: New   4.  Patient will demonstrate Rt LE MMT 4/5 throughout to faciltiate usual transfers, stairs, squatting at [facility] for daily life.  Goal status: New   5.  Patient will demonstrate Rt knee AROM 0-100 deg to facilitate usual daily mobility, transfers, and ambulation at PLOF.  Goal status: New   6.  Patient will demonstrate independent ambulation within community distances > 300 ft.  Goal status: New   PLAN:  PT FREQUENCY: 2-3x/week  PT DURATION: 8 weeks  PLANNED INTERVENTIONS: Therapeutic exercises, Therapeutic activity, Neuro Muscular re-education, Balance training, Gait training, Patient/Family education, Joint mobilization, Stair training, DME instructions, Dry Needling, Electrical stimulation, Traction, Cryotherapy, vasopneumatic deviceMoist heat, Taping, Ultrasound, Ionotophoresis 4mg /ml Dexamethasone, and aquatic therapy, Manual therapy.  All included unless contraindicated  PLAN FOR NEXT SESSION: Review HEP, aggressive flexion focus as tolerated   Narda Amber, PT, mPT 01/15/23 11:26 AM   01/15/23 11:26 AM       Date of referral: 12/12/2022 Referring provider: Kathryne Hitch, MD Referring diagnosis? M17.11 (ICD-10-CM) - Primary osteoarthritis of right knee Treatment diagnosis? (if different than referring diagnosis) M25.561  What was this (referring dx) caused by? Surgery (Type: Rt TKA)  Nature of Condition: Initial Onset (within last 3 months)   Laterality: Rt  Current Functional Measure Score: FOTO 43  Objective measurements identify impairments when they are compared to normal values, the uninvolved extremity, and prior level of function.  [x]  Yes  []  No  Objective assessment of functional ability: Moderate functional limitations   Briefly describe symptoms: knee pain and stiffness following TKA  How did symptoms start: Surgery performed on 12/14/2022  Average pain intensity:  Last 24 hours:  5  Past week: 9  How often does the pt experience symptoms? Constantly  How much have the symptoms interfered with usual daily activities? Quite a bit  How has condition changed since care began at this facility? NA - initial visit  In general, how is the patients overall health? Good  (Back screen not indicated due to TKA)

## 2023-01-18 ENCOUNTER — Ambulatory Visit: Payer: Medicare Other | Admitting: Physical Therapy

## 2023-01-18 ENCOUNTER — Encounter: Payer: Self-pay | Admitting: Physical Therapy

## 2023-01-18 DIAGNOSIS — G8929 Other chronic pain: Secondary | ICD-10-CM | POA: Diagnosis not present

## 2023-01-18 DIAGNOSIS — M25661 Stiffness of right knee, not elsewhere classified: Secondary | ICD-10-CM

## 2023-01-18 DIAGNOSIS — R600 Localized edema: Secondary | ICD-10-CM | POA: Diagnosis not present

## 2023-01-18 DIAGNOSIS — M25561 Pain in right knee: Secondary | ICD-10-CM | POA: Diagnosis not present

## 2023-01-18 DIAGNOSIS — M6281 Muscle weakness (generalized): Secondary | ICD-10-CM

## 2023-01-18 DIAGNOSIS — R262 Difficulty in walking, not elsewhere classified: Secondary | ICD-10-CM | POA: Diagnosis not present

## 2023-01-18 NOTE — Therapy (Signed)
OUTPATIENT PHYSICAL THERAPY TREATMENT   Patient Name: Candace Bell MRN: [id number] DOB:[birth year], 72 y.o., female Today's Date: 01/18/2023  END OF SESSION:  PT End of Session - 01/18/23 0851     Visit Number 7    Number of Visits 16    Date for PT Re-Evaluation 02/21/23    Authorization Type [employer] Medicare $20 copay    Progress Note Due on Visit 10    PT Start Time 0840    PT Stop Time 0920    PT Time Calculation (min) 40 min    Activity Tolerance Patient tolerated treatment well    Behavior During Therapy [facility] for tasks assessed/performed                Past Medical History:  Diagnosis Date   Arthritis    generalized   Cataract 2015   bilateral sx   GERD (gastroesophageal reflux disease)    with certain foods   Hypertension    on meds   Osteopenia 10/13/2015   Past Surgical History:  Procedure Laterality Date   ABDOMINAL HYSTERECTOMY  04/10/1991   TAH --took one ovary   BACK SURGERY  2002   BUNIONECTOMY WITH HAMMERTOE RECONSTRUCTION Right 2014   CATARACT EXTRACTION Bilateral 04/09/2013   COLONOSCOPY  2015   MS-MAC-movi(exc)-ulcerated polyp   right wrist surgery due to fracture     TOTAL KNEE ARTHROPLASTY Right 12/14/2022   Procedure: RIGHT TOTAL KNEE ARTHROPLASTY;  Surgeon: Kathryne Hitch, MD;  Location: [facility];  Service: Orthopedics;  Laterality: Right;   Patient Active Problem List   Diagnosis Date Noted   Status post total right knee replacement 12/14/2022   Preventative health care 07/03/2021   Upper extremity weakness 11/18/2020   Hypertension    Arthritis    Other fatigue 03/07/2020   Loss of appetite 03/07/2020   SOB (shortness of breath) 03/07/2020   Depression, major, single episode, mild (HCC) 03/07/2020   Left hand pain 10/21/2018   Hyperlipidemia LDL goal <100 01/26/2018   Memory loss 04/16/2017   Osteopenia 10/13/2015   Back pain 11/25/2013   Essential hypertension 02/29/2012    PCP: Jackelyn Poling DO  REFERRING  PROVIDER: Kathryne Hitch, MD  REFERRING DIAG: M17.11 (ICD-10-CM) - Primary osteoarthritis of right knee  THERAPY DIAG:  Chronic pain of right knee  Stiffness of right knee, not elsewhere classified  Muscle weakness (generalized)  Difficulty in walking, not elsewhere classified  Localized edema  Rationale for Evaluation and Treatment: Rehabilitation  ONSET DATE:  12/14/2022 surgery date  SUBJECTIVE:   SUBJECTIVE STATEMENT: Doing pretty well today, no pain upon arrival.  Walking intermittently with cane.  PERTINENT HISTORY: GERD, HTN, osteopenia  PAIN:  NPRS scale: no pain at rest, 6/10 with flexion Pain location: Rt knee Pain description: sharp Aggravating factors: walking, bending the knee Relieving factors: rest, medication  PRECAUTIONS: None  WEIGHT BEARING RESTRICTIONS: No  FALLS:  Has patient fallen in last 6 months? No  LIVING ENVIRONMENT: Lives with: lives with their daughter Lives in: House/apartment Stairs: No; has one curb step to enter Has following equipment at home: RW, [facility]  OCCUPATION: retired from [employer]  PLOF: Independent  PATIENT GOALS: get up and take care of self   OBJECTIVE:   PATIENT SURVEYS:  12/27/2022 FOTO intake: 43   predicted:  62  COGNITION: 12/27/2022 Overall cognitive status: WFL    SENSATION: 12/27/2022 WFL  EDEMA:  12/27/2022 Circumferential: Knee joint line: Rt: 49.2 cm   /Lt: 41.8 cm  LOWER EXTREMITY ROM:    12/27/22: significant guarding with flexion PROM   ROM Right 12/27/2022 Rt 01/01/23 Rt 01/08/23 Rt 01/10/23 Rt 01/14/23 Rt 01/15/23  Knee flexion A: 30 P: 38 A: 44 P: 48 A: 55 P; 60 P:70 P:78 A: 65 P: 74  supine  Knee extension A: -14 (seated LAQ) P: 0 A: -10 P: 0 A: -8 P: -2   A: -5 P: 0   (Blank rows = not tested)  LOWER EXTREMITY MMT:  Not formally measured; Rt knee grossly 3-/5  MMT Right 12/27/2022 Left 12/27/2022 Rt:  01/08/23  Hip flexion   4  Hip extension     Hip abduction      Hip adduction     Hip internal rotation     Hip external rotation     Knee flexion   3  Knee extension   3  Ankle dorsiflexion   4  Ankle plantarflexion     Ankle inversion     Ankle eversion      (Blank rows = not tested)   12/27/2022 Ambulation with RW mod I; decreased Rt hip/knee flexion, decreased stance on Rt                                                                                                                                                                        TODAY'S TREATMENT DATE 01/18/23 TherEx: NuStep L5, seat #8 UE/LE X 10 min Placing Rt foot on 6 inch step lunging forward and back x 10 holding at end range for 20 sec Seated AA Rt knee flexion; LLE providing assist 10x10 sec hold  Manual Seated Rt knee flexion with cues to decrease hip hike PROM to tolerance x 8 min  01/15/2023 TherEx: Nu step L5, seat #8 UE/LE X 8 min Placing Rt foot on 6 inch step lunging forward and back x 10 holding at end range for 3-5 sec Step ups on 6 inch x 15 leading and lowering with Rt LE Leg press bil LE's 56# 2 x 10, Rt LE only: 31# 2 x 10  AA heel slides using stretch strap and foot on ball x 15 holding at end range x 15  Manual:  PROM with overpressure for extension in supine PROM knee flexion with hip flexion to 90 degrees pt's tolerance PROM knee flexion heel slide  Modalities:  Vasopneumatic: 34 deg, medium compression, LE elevated x 10 minutes    01/14/2023 TherEx: Nu step L5, seat #8 UE/LE X 10 min Standing knee flexion lunge stretch with Rt foot on 8 inch step using bars for UE support 5 sec hold X 10 Leg press Rt leg only 31# with 5 sec hold at max flexion X 15 Step ups onto  6 inch step with one UE support, leading with Rt leg X 10 forward and X 10 lateral Supine knee flexion AAROM with stap and foot on ball 5 sec X 10 Manual:  PROM with overpressure, mix of high load short duration stretch and low load longer duration stretching for seated knee flexion  to pt's tolerance  01/10/2023 TherEx: Nu step L5, seat #8 UE/LE X 10 min Standing knee flexion lunge stretch with Rt foot on 8 inch step using bars for UE support 5 sec hold X 10 Leg press Rt leg only 25# with 5 sec hold at max flexion X 15 LAQ: seated on raised mat table with 2# weight, 20 reps holding 5 sec at max flexion Manual:  PROM with overpressure, mix of high load short duration stretch and low load longer duration stretching for seated knee flexion to pt's tolerance  01/08/2023 TherEx: Scifit bike: rocking back and forth x 6 minutes seat at 7 Sit to stand 3 x 5 gradually moving pt's Rt foot back for increased Rt knee flexion LAQ: 2 x 10 AAROM: knee flexion in sitting, using left LE to push back on Rt  Supine: heel slides: x 10 holding 5 sec end range Supine: SLR: 2 x 10, pt began with mild extensor lag, after instructions to perform quad set and df before trying to lift her lag improved.  Attempted supine bridge, Pt unable to perform Hamstring sets in supine: x 10 holding 5 sec Manual:  PROM in seated flexion to pt's tolerance, also performed hip and knee flexion in supine Supine knee extension with over pressure  Modalites:  Vasopneumatic: medium compression, 34 deg x 10 minutes, LE elevated on bolster      PATIENT EDUCATION:  12/27/2022 Education details: HEP, POC Person educated: Patient Education method: Programmer, multimedia, Facilities manager, Verbal cues, and Handouts Education comprehension: verbalized understanding, returned demonstration, and verbal cues required  HOME EXERCISE PROGRAM: Access Code: [email or web address] URL: [email or web address] Date: 12/27/2022 Prepared by: Moshe Cipro  Exercises - Quad Set  - 3-5 x daily - 7 x weekly - 1 sets - 10 reps - 5 sec hold - Supine Heel Slide with Strap  - 5-10 x daily - 7 x weekly - 1 sets - 5-10 reps - Seated Heel Slide  - 3-5 x daily - 7 x weekly - 1 sets - 5-10 reps - 5-10 sec hold - Seated Knee Flexion  AAROM  - 3-5 x daily - 7 x weekly - 1 sets - 5-10 reps - 5-10 sec hold  ASSESSMENT:  CLINICAL IMPRESSION: 01/18/2023: Contimed focus on maximizing flexion with goal to get to 90 deg by MD follow up.  Will continue to benefit from PT to maximize function.    OBJECTIVE IMPAIRMENTS: Abnormal gait, decreased activity tolerance, decreased knowledge of use of DME, decreased mobility, difficulty walking, decreased ROM, decreased strength, hypomobility, increased edema, increased fascial restrictions, increased muscle spasms, impaired flexibility, and pain.   ACTIVITY LIMITATIONS: carrying, lifting, bending, sitting, standing, sleeping, stairs, transfers, and locomotion level  PARTICIPATION LIMITATIONS: meal prep, cleaning, medication management, driving, shopping, and community activity  PERSONAL FACTORS: Age and 3+ comorbidities: GERD, HTN, osteopenia  are also affecting patient's functional outcome.   REHAB POTENTIAL: Good  CLINICAL DECISION MAKING: Evolving/moderate complexity  EVALUATION COMPLEXITY: Moderate   GOALS: Goals reviewed with patient? Yes  SHORT TERM GOALS: Target Date 01/24/2023   1.  Patient will demonstrate independent use of home exercise program to maintain progress from in clinic treatments. Goal status: MET 01/15/23  2.  Patient will demonstrate Rt knee AROM 0-60 deg to facilitate usual daily mobility, transfers, and ambulation at PLOF.  Goal status: MET 01/15/23  LONG TERM GOALS: Target Date 02/21/2023   1. Patient will demonstrate/report pain at worst less than or equal to 2/10 to facilitate minimal limitation in daily activity secondary to pain symptoms. Goal status: New   2. Patient will demonstrate independent use of home exercise program to facilitate ability to maintain/progress functional gains from skilled physical therapy services. Goal status: New   3. Patient will demonstrate FOTO outcome > or = 62 % to indicate reduced disability due to  condition. Goal status: New   4.  Patient will demonstrate Rt LE MMT 4/5 throughout to faciltiate usual transfers, stairs, squatting at [facility] for daily life.  Goal status: New   5.  Patient will demonstrate Rt knee AROM 0-100 deg to facilitate usual daily mobility, transfers, and ambulation at PLOF.  Goal status: New   6.  Patient will demonstrate independent ambulation within community distances > 300 ft.  Goal status: New   PLAN:  PT FREQUENCY: 2-3x/week  PT DURATION: 8 weeks  PLANNED INTERVENTIONS: Therapeutic exercises, Therapeutic activity, Neuro Muscular re-education, Balance training, Gait training, Patient/Family education, Joint mobilization, Stair training, DME instructions, Dry Needling, Electrical stimulation, Traction, Cryotherapy, vasopneumatic deviceMoist heat, Taping, Ultrasound, Ionotophoresis 4mg /ml Dexamethasone, and aquatic therapy, Manual therapy.  All included unless contraindicated  PLAN FOR NEXT SESSION: Review HEP PRN, aggressive flexion focus as tolerated, balance and strengthening with gait without device as able   NEXT MD VISIT: 01/28/23   Clarita Crane, PT, DPT 01/18/23 9:23 AM

## 2023-01-22 ENCOUNTER — Encounter: Payer: Self-pay | Admitting: Physical Therapy

## 2023-01-22 ENCOUNTER — Ambulatory Visit: Payer: Medicare Other | Admitting: Physical Therapy

## 2023-01-22 DIAGNOSIS — M25561 Pain in right knee: Secondary | ICD-10-CM

## 2023-01-22 DIAGNOSIS — M25661 Stiffness of right knee, not elsewhere classified: Secondary | ICD-10-CM | POA: Diagnosis not present

## 2023-01-22 DIAGNOSIS — G8929 Other chronic pain: Secondary | ICD-10-CM

## 2023-01-22 DIAGNOSIS — R262 Difficulty in walking, not elsewhere classified: Secondary | ICD-10-CM

## 2023-01-22 DIAGNOSIS — R600 Localized edema: Secondary | ICD-10-CM | POA: Diagnosis not present

## 2023-01-22 DIAGNOSIS — M6281 Muscle weakness (generalized): Secondary | ICD-10-CM

## 2023-01-22 NOTE — Therapy (Signed)
OUTPATIENT PHYSICAL THERAPY TREATMENT   Patient Name: Candace Bell MRN: [id number] DOB:[birth year], 72 y.o., female Today's Date: 01/22/2023  END OF SESSION:  PT End of Session - 01/22/23 0804     Visit Number 8    Number of Visits 16    Date for PT Re-Evaluation 02/21/23    Authorization Type [employer] Medicare $20 copay    Progress Note Due on Visit 10    PT Start Time 0801    PT Stop Time 0842    PT Time Calculation (min) 41 min    Activity Tolerance Patient tolerated treatment well    Behavior During Therapy [facility] for tasks assessed/performed                 Past Medical History:  Diagnosis Date   Arthritis    generalized   Cataract 2015   bilateral sx   GERD (gastroesophageal reflux disease)    with certain foods   Hypertension    on meds   Osteopenia 10/13/2015   Past Surgical History:  Procedure Laterality Date   ABDOMINAL HYSTERECTOMY  04/10/1991   TAH --took one ovary   BACK SURGERY  2002   BUNIONECTOMY WITH HAMMERTOE RECONSTRUCTION Right 2014   CATARACT EXTRACTION Bilateral 04/09/2013   COLONOSCOPY  2015   MS-MAC-movi(exc)-ulcerated polyp   right wrist surgery due to fracture     TOTAL KNEE ARTHROPLASTY Right 12/14/2022   Procedure: RIGHT TOTAL KNEE ARTHROPLASTY;  Surgeon: Kathryne Hitch, MD;  Location: [facility];  Service: Orthopedics;  Laterality: Right;   Patient Active Problem List   Diagnosis Date Noted   Status post total right knee replacement 12/14/2022   Preventative health care 07/03/2021   Upper extremity weakness 11/18/2020   Hypertension    Arthritis    Other fatigue 03/07/2020   Loss of appetite 03/07/2020   SOB (shortness of breath) 03/07/2020   Depression, major, single episode, mild (HCC) 03/07/2020   Left hand pain 10/21/2018   Hyperlipidemia LDL goal <100 01/26/2018   Memory loss 04/16/2017   Osteopenia 10/13/2015   Back pain 11/25/2013   Essential hypertension 02/29/2012    PCP: Jackelyn Poling DO  REFERRING  PROVIDER: Kathryne Hitch, MD  REFERRING DIAG: M17.11 (ICD-10-CM) - Primary osteoarthritis of right knee  THERAPY DIAG:  Chronic pain of right knee  Stiffness of right knee, not elsewhere classified  Muscle weakness (generalized)  Difficulty in walking, not elsewhere classified  Localized edema  Rationale for Evaluation and Treatment: Rehabilitation  ONSET DATE:  12/14/2022 surgery date  SUBJECTIVE:   SUBJECTIVE STATEMENT: Worked on bending her knee over the weekend.   PERTINENT HISTORY: GERD, HTN, osteopenia  PAIN:  NPRS scale: no pain at rest, 6/10 with flexion Pain location: Rt knee Pain description: sharp Aggravating factors: walking, bending the knee Relieving factors: rest, medication  PRECAUTIONS: None  WEIGHT BEARING RESTRICTIONS: No  FALLS:  Has patient fallen in last 6 months? No  LIVING ENVIRONMENT: Lives with: lives with their daughter Lives in: House/apartment Stairs: No; has one curb step to enter Has following equipment at home: RW, [facility]  OCCUPATION: retired from [employer]  PLOF: Independent  PATIENT GOALS: get up and take care of self   OBJECTIVE:   PATIENT SURVEYS:  12/27/2022 FOTO intake: 43   predicted:  62  COGNITION: 12/27/2022 Overall cognitive status: WFL    SENSATION: 12/27/2022 WFL  EDEMA:  12/27/2022 Circumferential: Knee joint line: Rt: 49.2 cm   /Lt: 41.8 cm     LOWER  EXTREMITY ROM:    12/27/22: significant guarding with flexion PROM   ROM Right 12/27/2022 Rt 01/01/23 Rt 01/08/23 Rt 01/10/23 Rt 01/14/23 Rt 01/15/23 Right 01/22/23  Knee flexion A: 30 P: 38 A: 44 P: 48 A: 55 P; 60 P:70 P:78 A: 65 P: 74  supine A: 81 P: 95  Knee extension A: -14 (seated LAQ) P: 0 A: -10 P: 0 A: -8 P: -2   A: -5 P: 0    (Blank rows = not tested)  LOWER EXTREMITY MMT:  Not formally measured; Rt knee grossly 3-/5  MMT Right 12/27/2022 Left 12/27/2022 Rt:  01/08/23  Hip flexion   4  Hip extension     Hip abduction      Hip adduction     Hip internal rotation     Hip external rotation     Knee flexion   3  Knee extension   3  Ankle dorsiflexion   4  Ankle plantarflexion     Ankle inversion     Ankle eversion      (Blank rows = not tested)   12/27/2022 Ambulation with RW mod I; decreased Rt hip/knee flexion, decreased stance on Rt                                                                                                                                                                        TODAY'S TREATMENT DATE 01/22/23 TherEx: NuStep L6, seat #8 UE/LE X 10 min Leg press 75# bil 3x10; RLE only 31# 3x10; 30 sec flexion holds between sets Seated AA Rt knee flexion; LLE providing assist 10x10 sec hold Supine AA heel slides x 10 reps on Rt ROM measurements - see above for details  Manual Seated Rt knee flexion with cues to decrease hip hike PROM to tolerance x 8 min  01/18/23 TherEx: NuStep L5, seat #8 UE/LE X 10 min Placing Rt foot on 6 inch step lunging forward and back x 10 holding at end range for 20 sec Seated AA Rt knee flexion; LLE providing assist 10x10 sec hold  Manual Seated Rt knee flexion with cues to decrease hip hike PROM to tolerance x 8 min  01/15/2023 TherEx: Nu step L5, seat #8 UE/LE X 8 min Placing Rt foot on 6 inch step lunging forward and back x 10 holding at end range for 3-5 sec Step ups on 6 inch x 15 leading and lowering with Rt LE Leg press bil LE's 56# 2 x 10, Rt LE only: 31# 2 x 10  AA heel slides using stretch strap and foot on ball x 15 holding at end range x 15  Manual:  PROM with overpressure for extension in supine PROM knee flexion with hip flexion  to 90 degrees pt's tolerance PROM knee flexion heel slide  Modalities:  Vasopneumatic: 34 deg, medium compression, LE elevated x 10 minutes    01/14/2023 TherEx: Nu step L5, seat #8 UE/LE X 10 min Standing knee flexion lunge stretch with Rt foot on 8 inch step using bars for UE support 5 sec hold X  10 Leg press Rt leg only 31# with 5 sec hold at max flexion X 15 Step ups onto 6 inch step with one UE support, leading with Rt leg X 10 forward and X 10 lateral Supine knee flexion AAROM with stap and foot on ball 5 sec X 10 Manual:  PROM with overpressure, mix of high load short duration stretch and low load longer duration stretching for seated knee flexion to pt's tolerance    PATIENT EDUCATION:  12/27/2022 Education details: HEP, POC Person educated: Patient Education method: Programmer, multimedia, Facilities manager, Verbal cues, and Handouts Education comprehension: verbalized understanding, returned demonstration, and verbal cues required  HOME EXERCISE PROGRAM: Access Code: [email or web address] URL: [email or web address] Date: 12/27/2022 Prepared by: Moshe Cipro  Exercises - Quad Set  - 3-5 x daily - 7 x weekly - 1 sets - 10 reps - 5 sec hold - Supine Heel Slide with Strap  - 5-10 x daily - 7 x weekly - 1 sets - 5-10 reps - Seated Heel Slide  - 3-5 x daily - 7 x weekly - 1 sets - 5-10 reps - 5-10 sec hold - Seated Knee Flexion AAROM  - 3-5 x daily - 7 x weekly - 1 sets - 5-10 reps - 5-10 sec hold  ASSESSMENT:  CLINICAL IMPRESSION: 01/22/2023: Good progress with ROM today and overall progressing well with PT.  Did some amb without AD in clinic today with no significant instability noted.  Will continue to benefit from PT to maximize function.    OBJECTIVE IMPAIRMENTS: Abnormal gait, decreased activity tolerance, decreased knowledge of use of DME, decreased mobility, difficulty walking, decreased ROM, decreased strength, hypomobility, increased edema, increased fascial restrictions, increased muscle spasms, impaired flexibility, and pain.   ACTIVITY LIMITATIONS: carrying, lifting, bending, sitting, standing, sleeping, stairs, transfers, and locomotion level  PARTICIPATION LIMITATIONS: meal prep, cleaning, medication management, driving, shopping, and community  activity  PERSONAL FACTORS: Age and 3+ comorbidities: GERD, HTN, osteopenia  are also affecting patient's functional outcome.   REHAB POTENTIAL: Good  CLINICAL DECISION MAKING: Evolving/moderate complexity  EVALUATION COMPLEXITY: Moderate   GOALS: Goals reviewed with patient? Yes  SHORT TERM GOALS: Target Date 01/24/2023   1.  Patient will demonstrate independent use of home exercise program to maintain progress from in clinic treatments. Goal status: MET 01/15/23  2.  Patient will demonstrate Rt knee AROM 0-60 deg to facilitate usual daily mobility, transfers, and ambulation at PLOF.  Goal status: MET 01/15/23  LONG TERM GOALS: Target Date 02/21/2023   1. Patient will demonstrate/report pain at worst less than or equal to 2/10 to facilitate minimal limitation in daily activity secondary to pain symptoms. Goal status: New   2. Patient will demonstrate independent use of home exercise program to facilitate ability to maintain/progress functional gains from skilled physical therapy services. Goal status: New   3. Patient will demonstrate FOTO outcome > or = 62 % to indicate reduced disability due to condition. Goal status: New   4.  Patient will demonstrate Rt LE MMT 4/5 throughout to faciltiate usual transfers, stairs, squatting at [facility] for daily life.  Goal status: New   5.  Patient will demonstrate Rt knee  AROM 0-100 deg to facilitate usual daily mobility, transfers, and ambulation at PLOF.  Goal status: New   6.  Patient will demonstrate independent ambulation within community distances > 300 ft.  Goal status: New   PLAN:  PT FREQUENCY: 2-3x/week  PT DURATION: 8 weeks  PLANNED INTERVENTIONS: Therapeutic exercises, Therapeutic activity, Neuro Muscular re-education, Balance training, Gait training, Patient/Family education, Joint mobilization, Stair training, DME instructions, Dry Needling, Electrical stimulation, Traction, Cryotherapy, vasopneumatic deviceMoist heat,  Taping, Ultrasound, Ionotophoresis 4mg /ml Dexamethasone, and aquatic therapy, Manual therapy.  All included unless contraindicated  PLAN FOR NEXT SESSION: will need MD note next 1-2 visits,  Review HEP PRN, aggressive flexion focus as tolerated, balance and strengthening with gait without device as able   NEXT MD VISIT: 01/28/23   Clarita Crane, PT, DPT 01/22/23 8:43 AM

## 2023-01-23 ENCOUNTER — Ambulatory Visit: Payer: Medicare Other | Admitting: Physical Therapy

## 2023-01-23 ENCOUNTER — Encounter: Payer: Self-pay | Admitting: Physical Therapy

## 2023-01-23 DIAGNOSIS — M25661 Stiffness of right knee, not elsewhere classified: Secondary | ICD-10-CM

## 2023-01-23 DIAGNOSIS — M25561 Pain in right knee: Secondary | ICD-10-CM | POA: Diagnosis not present

## 2023-01-23 DIAGNOSIS — R600 Localized edema: Secondary | ICD-10-CM

## 2023-01-23 DIAGNOSIS — R262 Difficulty in walking, not elsewhere classified: Secondary | ICD-10-CM

## 2023-01-23 DIAGNOSIS — M6281 Muscle weakness (generalized): Secondary | ICD-10-CM

## 2023-01-23 DIAGNOSIS — G8929 Other chronic pain: Secondary | ICD-10-CM | POA: Diagnosis not present

## 2023-01-23 NOTE — Therapy (Signed)
OUTPATIENT PHYSICAL THERAPY TREATMENT   Patient Name: Candace Bell MRN: [id number] DOB:Dec 14, 1950, 72 y.o., female Today's Date: 01/23/2023  END OF SESSION:  PT End of Session - 01/23/23 0811     Visit Number 9    Number of Visits 16    Date for PT Re-Evaluation 02/21/23    Authorization Type [employer] Medicare $20 copay    Progress Note Due on Visit 10    PT Start Time 0802    PT Stop Time 0850    PT Time Calculation (min) 48 min    Activity Tolerance Patient tolerated treatment well    Behavior During Therapy [facility] for tasks assessed/performed                 Past Medical History:  Diagnosis Date   Arthritis    generalized   Cataract 2015   bilateral sx   GERD (gastroesophageal reflux disease)    with certain foods   Hypertension    on meds   Osteopenia 10/13/2015   Past Surgical History:  Procedure Laterality Date   ABDOMINAL HYSTERECTOMY  04/10/1991   TAH --took one ovary   BACK SURGERY  2002   BUNIONECTOMY WITH HAMMERTOE RECONSTRUCTION Right 2014   CATARACT EXTRACTION Bilateral 04/09/2013   COLONOSCOPY  2015   MS-MAC-movi(exc)-ulcerated polyp   right wrist surgery due to fracture     TOTAL KNEE ARTHROPLASTY Right 12/14/2022   Procedure: RIGHT TOTAL KNEE ARTHROPLASTY;  Surgeon: Kathryne Hitch, MD;  Location: [facility];  Service: Orthopedics;  Laterality: Right;   Patient Active Problem List   Diagnosis Date Noted   Status post total right knee replacement 12/14/2022   Preventative health care 07/03/2021   Upper extremity weakness 11/18/2020   Hypertension    Arthritis    Other fatigue 03/07/2020   Loss of appetite 03/07/2020   SOB (shortness of breath) 03/07/2020   Depression, major, single episode, mild (HCC) 03/07/2020   Left hand pain 10/21/2018   Hyperlipidemia LDL goal <100 01/26/2018   Memory loss 04/16/2017   Osteopenia 10/13/2015   Back pain 11/25/2013   Essential hypertension 02/29/2012    PCP: Jackelyn Poling DO  REFERRING  PROVIDER: Kathryne Hitch, MD  REFERRING DIAG: M17.11 (ICD-10-CM) - Primary osteoarthritis of right knee  THERAPY DIAG:  Chronic pain of right knee  Stiffness of right knee, not elsewhere classified  Muscle weakness (generalized)  Difficulty in walking, not elsewhere classified  Localized edema  Rationale for Evaluation and Treatment: Rehabilitation  ONSET DATE:  12/14/2022 surgery date  SUBJECTIVE:   SUBJECTIVE STATEMENT: Worked on bending her knee over the weekend.   PERTINENT HISTORY: GERD, HTN, osteopenia  PAIN:  NPRS scale: no pain at rest, 6/10 with flexion Pain location: Rt knee Pain description: sharp Aggravating factors: walking, bending the knee Relieving factors: rest, medication  PRECAUTIONS: None  WEIGHT BEARING RESTRICTIONS: No  FALLS:  Has patient fallen in last 6 months? No  LIVING ENVIRONMENT: Lives with: lives with their daughter Lives in: House/apartment Stairs: No; has one curb step to enter Has following equipment at home: RW, [facility]  OCCUPATION: retired from [employer]  PLOF: Independent  PATIENT GOALS: get up and take care of self   OBJECTIVE:   PATIENT SURVEYS:  12/27/2022 FOTO intake: 43   predicted:  62  COGNITION: 12/27/2022 Overall cognitive status: WFL    SENSATION: 12/27/2022 WFL  EDEMA:  12/27/2022 Circumferential: Knee joint line: Rt: 49.2 cm   /Lt: 41.8 cm     LOWER  EXTREMITY ROM:    12/27/22: significant guarding with flexion PROM   ROM Right 12/27/2022 Rt 01/01/23 Rt 01/08/23 Rt 01/10/23 Rt 01/14/23 Rt 01/15/23 Right 01/22/23 Rt 01/23/23  Knee flexion A: 30 P: 38 A: 44 P: 48 A: 55 P; 60 P:70 P:78 A: 65 P: 74  supine A: 81 P: 95 A: 82 P: 90  Knee extension A: -14 (seated LAQ) P: 0 A: -10 P: 0 A: -8 P: -2   A: -5 P: 0  A: -4 P: 0   (Blank rows = not tested)  LOWER EXTREMITY MMT:  Not formally measured; Rt knee grossly 3-/5  MMT Right 12/27/2022 Left 12/27/2022 Rt:  01/08/23  Hip flexion   4   Hip extension     Hip abduction     Hip adduction     Hip internal rotation     Hip external rotation     Knee flexion   3  Knee extension   3  Ankle dorsiflexion   4  Ankle plantarflexion     Ankle inversion     Ankle eversion      (Blank rows = not tested)   12/27/2022 Ambulation with RW mod I; decreased Rt hip/knee flexion, decreased stance on Rt                                                                                                                                                                        TODAY'S TREATMENT DATE 01/23/23 TherEx: Scifit bike: seat at 10, rocking back and forth until pt was able to make full revolution with hip hiking noted Leg press 75# bil 3x10; RLE only 31# 3x10; 10 sec holds for flexion stretch Step ups on 6 inch step: x 15 leading with Rt LE Step on 6 inch step lunging forward holding flexion stretch 5 sec x 10 LAQ c 4 # 2 x 10  Supine active assisted heel slides using strap x 15 holding 3nd range flexion, attempted using bar to hold hip flexed at 90 with gravity induced knee flexion ROM measurements - see above for details Manual Supine passive ROM in flexion and extension     01/22/23 TherEx: NuStep L6, seat #8 UE/LE X 10 min Leg press 75# bil 3x10; RLE only 31# 3x10; 30 sec flexion holds between sets Seated AA Rt knee flexion; LLE providing assist 10x10 sec hold Supine AA heel slides x 10 reps on Rt ROM measurements - see above for details  Manual Seated Rt knee flexion with cues to decrease hip hike PROM to tolerance x 8 min  01/18/23 TherEx: NuStep L5, seat #8 UE/LE X 10 min Placing Rt foot on 6 inch step lunging forward and back x 10 holding at end  range for 20 sec Seated AA Rt knee flexion; LLE providing assist 10x10 sec hold  Manual Seated Rt knee flexion with cues to decrease hip hike PROM to tolerance x 8 min  01/15/2023 TherEx: Nu step L5, seat #8 UE/LE X 8 min Placing Rt foot on 6 inch step lunging  forward and back x 10 holding at end range for 3-5 sec Step ups on 6 inch x 15 leading and lowering with Rt LE Leg press bil LE's 56# 2 x 10, Rt LE only: 31# 2 x 10  AA heel slides using stretch strap and foot on ball x 15 holding at end range x 15  Manual:  PROM with overpressure for extension in supine PROM knee flexion with hip flexion to 90 degrees pt's tolerance PROM knee flexion heel slide  Modalities:  Vasopneumatic: 34 deg, medium compression, LE elevated x 10 minutes    01/14/2023 TherEx: Nu step L5, seat #8 UE/LE X 10 min Standing knee flexion lunge stretch with Rt foot on 8 inch step using bars for UE support 5 sec hold X 10 Leg press Rt leg only 31# with 5 sec hold at max flexion X 15 Step ups onto 6 inch step with one UE support, leading with Rt leg X 10 forward and X 10 lateral Supine knee flexion AAROM with stap and foot on ball 5 sec X 10 Manual:  PROM with overpressure, mix of high load short duration stretch and low load longer duration stretching for seated knee flexion to pt's tolerance    PATIENT EDUCATION:  12/27/2022 Education details: HEP, POC Person educated: Patient Education method: Programmer, multimedia, Facilities manager, Verbal cues, and Handouts Education comprehension: verbalized understanding, returned demonstration, and verbal cues required  HOME EXERCISE PROGRAM: Access Code: [email or web address] URL: [email or web address] Date: 12/27/2022 Prepared by: Moshe Cipro  Exercises - Quad Set  - 3-5 x daily - 7 x weekly - 1 sets - 10 reps - 5 sec hold - Supine Heel Slide with Strap  - 5-10 x daily - 7 x weekly - 1 sets - 5-10 reps - Seated Heel Slide  - 3-5 x daily - 7 x weekly - 1 sets - 5-10 reps - 5-10 sec hold - Seated Knee Flexion AAROM  - 3-5 x daily - 7 x weekly - 1 sets - 5-10 reps - 5-10 sec hold  ASSESSMENT:  CLINICAL IMPRESSION: 01/23/2023: Treatment focusing more on flexion based activities to improve pt's overall function. Pt is  beginning to walk shorter distances at home with no device. Pt encouraged in heel to toe gait pattern. Recommend continued skilled PT to maximize function.    OBJECTIVE IMPAIRMENTS: Abnormal gait, decreased activity tolerance, decreased knowledge of use of DME, decreased mobility, difficulty walking, decreased ROM, decreased strength, hypomobility, increased edema, increased fascial restrictions, increased muscle spasms, impaired flexibility, and pain.   ACTIVITY LIMITATIONS: carrying, lifting, bending, sitting, standing, sleeping, stairs, transfers, and locomotion level  PARTICIPATION LIMITATIONS: meal prep, cleaning, medication management, driving, shopping, and community activity  PERSONAL FACTORS: Age and 3+ comorbidities: GERD, HTN, osteopenia  are also affecting patient's functional outcome.   REHAB POTENTIAL: Good  CLINICAL DECISION MAKING: Evolving/moderate complexity  EVALUATION COMPLEXITY: Moderate   GOALS: Goals reviewed with patient? Yes  SHORT TERM GOALS: Target Date 01/24/2023   1.  Patient will demonstrate independent use of home exercise program to maintain progress from in clinic treatments. Goal status: MET 01/15/23  2.  Patient will demonstrate Rt knee AROM 0-60 deg to facilitate usual daily mobility,  transfers, and ambulation at PLOF.  Goal status: MET 01/15/23  LONG TERM GOALS: Target Date 02/21/2023   1. Patient will demonstrate/report pain at worst less than or equal to 2/10 to facilitate minimal limitation in daily activity secondary to pain symptoms. Goal status: New   2. Patient will demonstrate independent use of home exercise program to facilitate ability to maintain/progress functional gains from skilled physical therapy services. Goal status: New   3. Patient will demonstrate FOTO outcome > or = 62 % to indicate reduced disability due to condition. Goal status: New   4.  Patient will demonstrate Rt LE MMT 4/5 throughout to faciltiate usual  transfers, stairs, squatting at [facility] for daily life.  Goal status: New   5.  Patient will demonstrate Rt knee AROM 0-100 deg to facilitate usual daily mobility, transfers, and ambulation at PLOF.  Goal status: New   6.  Patient will demonstrate independent ambulation within community distances > 300 ft.  Goal status: New   PLAN:  PT FREQUENCY: 2-3x/week  PT DURATION: 8 weeks  PLANNED INTERVENTIONS: Therapeutic exercises, Therapeutic activity, Neuro Muscular re-education, Balance training, Gait training, Patient/Family education, Joint mobilization, Stair training, DME instructions, Dry Needling, Electrical stimulation, Traction, Cryotherapy, vasopneumatic deviceMoist heat, Taping, Ultrasound, Ionotophoresis 4mg /ml Dexamethasone, and aquatic therapy, Manual therapy.  All included unless contraindicated  PLAN FOR NEXT SESSION: will need MD note next 1-2 visits,  Review HEP PRN, aggressive flexion focus as tolerated, balance and strengthening with gait without device as able   NEXT MD VISIT: 01/28/23  Narda Amber, PT, MPT 01/23/23 10:28 AM

## 2023-01-24 ENCOUNTER — Encounter: Payer: Medicare Other | Admitting: Physical Therapy

## 2023-01-25 ENCOUNTER — Ambulatory Visit: Payer: Medicare Other | Admitting: Physical Therapy

## 2023-01-25 ENCOUNTER — Encounter: Payer: Self-pay | Admitting: Physical Therapy

## 2023-01-25 DIAGNOSIS — M6281 Muscle weakness (generalized): Secondary | ICD-10-CM

## 2023-01-25 DIAGNOSIS — R600 Localized edema: Secondary | ICD-10-CM | POA: Diagnosis not present

## 2023-01-25 DIAGNOSIS — R262 Difficulty in walking, not elsewhere classified: Secondary | ICD-10-CM

## 2023-01-25 DIAGNOSIS — M25661 Stiffness of right knee, not elsewhere classified: Secondary | ICD-10-CM

## 2023-01-25 DIAGNOSIS — G8929 Other chronic pain: Secondary | ICD-10-CM | POA: Diagnosis not present

## 2023-01-25 DIAGNOSIS — M25561 Pain in right knee: Secondary | ICD-10-CM | POA: Diagnosis not present

## 2023-01-25 NOTE — Therapy (Signed)
OUTPATIENT PHYSICAL THERAPY TREATMENT PROGRESS NOTE  Progress Note Reporting Period 12/27/22 to 01/25/23  See note below for Objective Data and Assessment of Progress/Goals.       Patient Name: Candace Bell MRN: [id number] DOB:29-Jun-1950, 72 y.o., female Today's Date: 01/25/2023  END OF SESSION:  PT End of Session - 01/25/23 0803     Visit Number 10    Number of Visits 16    Date for PT Re-Evaluation 02/21/23    Authorization Type [employer] Medicare $20 copay    Progress Note Due on Visit 20    PT Start Time 0800    PT Stop Time 0846    PT Time Calculation (min) 46 min    Activity Tolerance Patient tolerated treatment well    Behavior During Therapy [facility] for tasks assessed/performed                  Past Medical History:  Diagnosis Date   Arthritis    generalized   Cataract 2015   bilateral sx   GERD (gastroesophageal reflux disease)    with certain foods   Hypertension    on meds   Osteopenia 10/13/2015   Past Surgical History:  Procedure Laterality Date   ABDOMINAL HYSTERECTOMY  04/10/1991   TAH --took one ovary   BACK SURGERY  2002   BUNIONECTOMY WITH HAMMERTOE RECONSTRUCTION Right 2014   CATARACT EXTRACTION Bilateral 04/09/2013   COLONOSCOPY  2015   MS-MAC-movi(exc)-ulcerated polyp   right wrist surgery due to fracture     TOTAL KNEE ARTHROPLASTY Right 12/14/2022   Procedure: RIGHT TOTAL KNEE ARTHROPLASTY;  Surgeon: Kathryne Hitch, MD;  Location: [facility];  Service: Orthopedics;  Laterality: Right;   Patient Active Problem List   Diagnosis Date Noted   Status post total right knee replacement 12/14/2022   Preventative health care 07/03/2021   Upper extremity weakness 11/18/2020   Hypertension    Arthritis    Other fatigue 03/07/2020   Loss of appetite 03/07/2020   SOB (shortness of breath) 03/07/2020   Depression, major, single episode, mild (HCC) 03/07/2020   Left hand pain 10/21/2018   Hyperlipidemia LDL goal <100 01/26/2018    Memory loss 04/16/2017   Osteopenia 10/13/2015   Back pain 11/25/2013   Essential hypertension 02/29/2012    PCP: Jackelyn Poling DO  REFERRING PROVIDER: Kathryne Hitch, MD  REFERRING DIAG: M17.11 (ICD-10-CM) - Primary osteoarthritis of right knee  THERAPY DIAG:  Chronic pain of right knee  Stiffness of right knee, not elsewhere classified  Muscle weakness (generalized)  Difficulty in walking, not elsewhere classified  Localized edema  Rationale for Evaluation and Treatment: Rehabilitation  ONSET DATE:  12/14/2022 surgery date  SUBJECTIVE:   SUBJECTIVE STATEMENT: No pain today; reports pain is very little   PERTINENT HISTORY: GERD, HTN, osteopenia  PAIN:  NPRS scale: no pain at rest, 6/10 with flexion Pain location: Rt knee Pain description: sharp Aggravating factors: walking, bending the knee Relieving factors: rest, medication  PRECAUTIONS: None  WEIGHT BEARING RESTRICTIONS: No  FALLS:  Has patient fallen in last 6 months? No  LIVING ENVIRONMENT: Lives with: lives with their daughter Lives in: House/apartment Stairs: No; has one curb step to enter Has following equipment at home: RW, [facility]  OCCUPATION: retired from [employer]  PLOF: Independent  PATIENT GOALS: get up and take care of self   OBJECTIVE:   PATIENT SURVEYS:  12/27/2022 FOTO intake: 43   predicted:  62  01/25/23: FOTO: 51  COGNITION: 12/27/2022 Overall  cognitive status: WFL    SENSATION: 12/27/2022 WFL  EDEMA:  12/27/2022 Circumferential: Knee joint line: Rt: 49.2 cm   /Lt: 41.8 cm     LOWER EXTREMITY ROM:    12/27/22: significant guarding with flexion PROM   ROM Right 12/27/2022 Rt 01/01/23 Rt 01/08/23 Rt 01/10/23 Rt 01/14/23 Rt 01/15/23 Right 01/22/23 Rt 01/23/23 Rt 01/25/23  Knee flexion A: 30 P: 38 A: 44 P: 48 A: 55 P; 60 P:70 P:78 A: 65 P: 74  supine A: 81 P: 95 A: 82 P: 90 A: 84 P: 92  Knee extension A: -14 (seated LAQ) P: 0 A: -10 P: 0 A: -8 P: -2    A: -5 P: 0  A: -4 P: 0 A: -2   (Blank rows = not tested)  LOWER EXTREMITY MMT:  Not formally measured; Rt knee grossly 3-/5  MMT Right 12/27/2022 Left 12/27/2022 Rt:  01/08/23  Hip flexion   4  Hip extension     Hip abduction     Hip adduction     Hip internal rotation     Hip external rotation     Knee flexion   3  Knee extension   3  Ankle dorsiflexion   4  Ankle plantarflexion     Ankle inversion     Ankle eversion      (Blank rows = not tested)   12/27/2022 Ambulation with RW mod I; decreased Rt hip/knee flexion, decreased stance on Rt                                                                                                                                                                        TODAY'S TREATMENT DATE 01/25/23 TherEx: NuStep seat 7 x 10 min Leg press 75# bil 3x10; RLE only 37# 3x10; 30 sec holds for flexion stretch between sets Seated AA Rt knee flexion; LLE providing assist 10x10 sec hold Supine AA heel slides x 10 reps on Rt ROM measurements - see above for details   01/23/23 TherEx: Scifit bike: seat at 10, rocking back and forth until pt was able to make full revolution with hip hiking noted Leg press 75# bil 3x10; RLE only 31# 3x10; 10 sec holds for flexion stretch Step ups on 6 inch step: x 15 leading with Rt LE Step on 6 inch step lunging forward holding flexion stretch 5 sec x 10 LAQ c 4 # 2 x 10  Supine active assisted heel slides using strap x 15 holding 3nd range flexion, attempted using bar to hold hip flexed at 90 with gravity induced knee flexion ROM measurements - see above for details Manual Supine passive ROM in flexion and extension     01/22/23 TherEx: NuStep L6, seat #  8 UE/LE X 10 min Leg press 75# bil 3x10; RLE only 31# 3x10; 30 sec flexion holds between sets Seated AA Rt knee flexion; LLE providing assist 10x10 sec hold Supine AA heel slides x 10 reps on Rt ROM measurements - see above for  details  Manual Seated Rt knee flexion with cues to decrease hip hike PROM to tolerance x 8 min  01/18/23 TherEx: NuStep L5, seat #8 UE/LE X 10 min Placing Rt foot on 6 inch step lunging forward and back x 10 holding at end range for 20 sec Seated AA Rt knee flexion; LLE providing assist 10x10 sec hold  Manual Seated Rt knee flexion with cues to decrease hip hike PROM to tolerance x 8 min  01/15/2023 TherEx: Nu step L5, seat #8 UE/LE X 8 min Placing Rt foot on 6 inch step lunging forward and back x 10 holding at end range for 3-5 sec Step ups on 6 inch x 15 leading and lowering with Rt LE Leg press bil LE's 56# 2 x 10, Rt LE only: 31# 2 x 10  AA heel slides using stretch strap and foot on ball x 15 holding at end range x 15  Manual:  PROM with overpressure for extension in supine PROM knee flexion with hip flexion to 90 degrees pt's tolerance PROM knee flexion heel slide  Modalities:  Vasopneumatic: 34 deg, medium compression, LE elevated x 10 minutes   PATIENT EDUCATION:  12/27/2022 Education details: HEP, POC Person educated: Patient Education method: Programmer, multimedia, Facilities manager, Verbal cues, and Handouts Education comprehension: verbalized understanding, returned demonstration, and verbal cues required  HOME EXERCISE PROGRAM: Access Code: [email or web address] URL: [email or web address] Date: 12/27/2022 Prepared by: Moshe Cipro  Exercises - Quad Set  - 3-5 x daily - 7 x weekly - 1 sets - 10 reps - 5 sec hold - Supine Heel Slide with Strap  - 5-10 x daily - 7 x weekly - 1 sets - 5-10 reps - Seated Heel Slide  - 3-5 x daily - 7 x weekly - 1 sets - 5-10 reps - 5-10 sec hold - Seated Knee Flexion AAROM  - 3-5 x daily - 7 x weekly - 1 sets - 5-10 reps - 5-10 sec hold  ASSESSMENT:  CLINICAL IMPRESSION: 01/25/2023: Pt has demonstrated progress towards all LTGs meeting her pain goal at this time.  Plan to conitnue to focus on maximizing flexion ROM and ensuring safe  functional mobility at home.  Recommend continued skilled PT to maximize function.    OBJECTIVE IMPAIRMENTS: Abnormal gait, decreased activity tolerance, decreased knowledge of use of DME, decreased mobility, difficulty walking, decreased ROM, decreased strength, hypomobility, increased edema, increased fascial restrictions, increased muscle spasms, impaired flexibility, and pain.   ACTIVITY LIMITATIONS: carrying, lifting, bending, sitting, standing, sleeping, stairs, transfers, and locomotion level  PARTICIPATION LIMITATIONS: meal prep, cleaning, medication management, driving, shopping, and community activity  PERSONAL FACTORS: Age and 3+ comorbidities: GERD, HTN, osteopenia  are also affecting patient's functional outcome.   REHAB POTENTIAL: Good  CLINICAL DECISION MAKING: Evolving/moderate complexity  EVALUATION COMPLEXITY: Moderate   GOALS: Goals reviewed with patient? Yes  SHORT TERM GOALS: Target Date 01/24/2023   1.  Patient will demonstrate independent use of home exercise program to maintain progress from in clinic treatments. Goal status: MET 01/15/23  2.  Patient will demonstrate Rt knee AROM 0-60 deg to facilitate usual daily mobility, transfers, and ambulation at PLOF.  Goal status: MET 01/15/23  LONG TERM GOALS: Target Date 02/21/2023   1.  Patient will demonstrate/report pain at worst less than or equal to 2/10 to facilitate minimal limitation in daily activity secondary to pain symptoms. Goal status: MET 01/25/23   2. Patient will demonstrate independent use of home exercise program to facilitate ability to maintain/progress functional gains from skilled physical therapy services. Goal status: ONGOING 01/25/23   3. Patient will demonstrate FOTO outcome > or = 62 % to indicate reduced disability due to condition. Goal status: ONGOING 01/25/23   4.  Patient will demonstrate Rt LE MMT 4/5 throughout to faciltiate usual transfers, stairs, squatting at [facility] for daily  life.  Goal status: ONGOING 01/25/23   5.  Patient will demonstrate Rt knee AROM 0-100 deg to facilitate usual daily mobility, transfers, and ambulation at PLOF.  Goal status: ONGOING 01/25/23   6.  Patient will demonstrate independent ambulation within community distances > 300 ft.  Goal status: ONGOING 01/25/23 (MOD I with SPC)   PLAN:  PT FREQUENCY: 2-3x/week  PT DURATION: 8 weeks  PLANNED INTERVENTIONS: Therapeutic exercises, Therapeutic activity, Neuro Muscular re-education, Balance training, Gait training, Patient/Family education, Joint mobilization, Stair training, DME instructions, Dry Needling, Electrical stimulation, Traction, Cryotherapy, vasopneumatic deviceMoist heat, Taping, Ultrasound, Ionotophoresis 4mg /ml Dexamethasone, and aquatic therapy, Manual therapy.  All included unless contraindicated  PLAN FOR NEXT SESSION:  aggressive flexion focus as tolerated, balance and strengthening with gait without device as able   NEXT MD VISIT: 01/28/23    Clarita Crane, PT, DPT 01/25/23 9:17 AM

## 2023-01-28 ENCOUNTER — Encounter: Payer: Self-pay | Admitting: Orthopaedic Surgery

## 2023-01-28 ENCOUNTER — Ambulatory Visit: Payer: Medicare Other | Admitting: Orthopaedic Surgery

## 2023-01-28 ENCOUNTER — Other Ambulatory Visit: Payer: Self-pay | Admitting: Family Medicine

## 2023-01-28 DIAGNOSIS — T8482XD Fibrosis due to internal orthopedic prosthetic devices, implants and grafts, subsequent encounter: Secondary | ICD-10-CM

## 2023-01-28 DIAGNOSIS — Z96651 Presence of right artificial knee joint: Secondary | ICD-10-CM

## 2023-01-28 DIAGNOSIS — I10 Essential (primary) hypertension: Secondary | ICD-10-CM

## 2023-01-28 NOTE — Progress Notes (Signed)
The patient is now just 6 weeks status post a right total knee arthroplasty.  She has been going to physical therapy on a regular basis.  She says she is getting there.  She reports improved range of motion and strength.  The notes from physical therapy reviewed.  Her active assist range of motion is terms of flexion of the right knee is 84 degrees and passive is 92 degrees.  On exam in the office today I can flex her to maybe close to 85 degrees.  Her calf is soft.  There is swelling to be expected.  She is 72 years old.  I have recommended a right knee manipulation under anesthesia sometime in the next 2 weeks close the thing that will help push her further in terms of her range of motion.  I described what this manipulation involves as well as the risks and benefits.  We would want her in physical therapy that afternoon after the manipulation the morning versus the next day and she understands that as well.  Will work on getting this scheduled and that I would see her back at 2 weeks out manipulation.  We would have an AP and lateral of her right knee at that visit.

## 2023-01-29 ENCOUNTER — Ambulatory Visit: Payer: Medicare Other | Admitting: Physical Therapy

## 2023-01-29 ENCOUNTER — Encounter: Payer: Self-pay | Admitting: Physical Therapy

## 2023-01-29 DIAGNOSIS — M25661 Stiffness of right knee, not elsewhere classified: Secondary | ICD-10-CM

## 2023-01-29 DIAGNOSIS — G8929 Other chronic pain: Secondary | ICD-10-CM

## 2023-01-29 DIAGNOSIS — R600 Localized edema: Secondary | ICD-10-CM

## 2023-01-29 DIAGNOSIS — R262 Difficulty in walking, not elsewhere classified: Secondary | ICD-10-CM

## 2023-01-29 DIAGNOSIS — M6281 Muscle weakness (generalized): Secondary | ICD-10-CM | POA: Diagnosis not present

## 2023-01-29 DIAGNOSIS — M25561 Pain in right knee: Secondary | ICD-10-CM

## 2023-01-29 NOTE — Therapy (Signed)
OUTPATIENT PHYSICAL THERAPY TREATMENT PROGRESS NOTE  Progress Note Reporting Period 12/27/22 to 01/25/23  See note below for Objective Data and Assessment of Progress/Goals.       Patient Name: Candace Bell MRN: [id number] DOB:[birth year], 72 y.o., female Today's Date: 01/29/2023  END OF SESSION:  PT End of Session - 01/29/23 0809     Visit Number 11    Number of Visits 16    Date for PT Re-Evaluation 02/21/23    Authorization Type [employer] Medicare $20 copay    Progress Note Due on Visit 20    PT Start Time 0801    PT Stop Time 0845    PT Time Calculation (min) 44 min    Activity Tolerance Patient tolerated treatment well    Behavior During Therapy [facility] for tasks assessed/performed                  Past Medical History:  Diagnosis Date   Arthritis    generalized   Cataract 2015   bilateral sx   GERD (gastroesophageal reflux disease)    with certain foods   Hypertension    on meds   Osteopenia 10/13/2015   Past Surgical History:  Procedure Laterality Date   ABDOMINAL HYSTERECTOMY  04/10/1991   TAH --took one ovary   BACK SURGERY  2002   BUNIONECTOMY WITH HAMMERTOE RECONSTRUCTION Right 2014   CATARACT EXTRACTION Bilateral 04/09/2013   COLONOSCOPY  2015   MS-MAC-movi(exc)-ulcerated polyp   right wrist surgery due to fracture     TOTAL KNEE ARTHROPLASTY Right 12/14/2022   Procedure: RIGHT TOTAL KNEE ARTHROPLASTY;  Surgeon: Kathryne Hitch, MD;  Location: [facility];  Service: Orthopedics;  Laterality: Right;   Patient Active Problem List   Diagnosis Date Noted   Status post total right knee replacement 12/14/2022   Preventative health care 07/03/2021   Upper extremity weakness 11/18/2020   Hypertension    Arthritis    Other fatigue 03/07/2020   Loss of appetite 03/07/2020   SOB (shortness of breath) 03/07/2020   Depression, major, single episode, mild (HCC) 03/07/2020   Left hand pain 10/21/2018   Hyperlipidemia LDL goal <100 01/26/2018    Memory loss 04/16/2017   Osteopenia 10/13/2015   Back pain 11/25/2013   Essential hypertension 02/29/2012    PCP: Jackelyn Poling DO  REFERRING PROVIDER: Kathryne Hitch, MD  REFERRING DIAG: M17.11 (ICD-10-CM) - Primary osteoarthritis of right knee  THERAPY DIAG:  Chronic pain of right knee  Stiffness of right knee, not elsewhere classified  Muscle weakness (generalized)  Difficulty in walking, not elsewhere classified  Localized edema  Rationale for Evaluation and Treatment: Rehabilitation  ONSET DATE:  12/14/2022 surgery date  SUBJECTIVE:   SUBJECTIVE STATEMENT: No pain today upon arrival, reports MD wants to have manipulation done but does not know when yet   PERTINENT HISTORY: GERD, HTN, osteopenia  PAIN:  NPRS scale: no pain at rest, 6/10 with flexion Pain location: Rt knee Pain description: sharp Aggravating factors: walking, bending the knee Relieving factors: rest, medication  PRECAUTIONS: None  WEIGHT BEARING RESTRICTIONS: No  FALLS:  Has patient fallen in last 6 months? No  LIVING ENVIRONMENT: Lives with: lives with their daughter Lives in: House/apartment Stairs: No; has one curb step to enter Has following equipment at home: RW, [facility]  OCCUPATION: retired from [employer]  PLOF: Independent  PATIENT GOALS: get up and take care of self   OBJECTIVE:   PATIENT SURVEYS:  12/27/2022 FOTO intake: 43   predicted:  62  01/25/23: FOTO: 51  COGNITION: 12/27/2022 Overall cognitive status: WFL    SENSATION: 12/27/2022 WFL  EDEMA:  12/27/2022 Circumferential: Knee joint line: Rt: 49.2 cm   /Lt: 41.8 cm     LOWER EXTREMITY ROM:    12/27/22: significant guarding with flexion PROM   ROM Right 12/27/2022 Rt 01/01/23 Rt 01/08/23 Rt 01/10/23 Rt 01/14/23 Rt 01/15/23 Right 01/22/23 Rt 01/23/23 Rt 01/25/23  Knee flexion A: 30 P: 38 A: 44 P: 48 A: 55 P; 60 P:70 P:78 A: 65 P: 74  supine A: 81 P: 95 A: 82 P: 90 A: 84 P: 92  Knee extension  A: -14 (seated LAQ) P: 0 A: -10 P: 0 A: -8 P: -2   A: -5 P: 0  A: -4 P: 0 A: -2   (Blank rows = not tested)  LOWER EXTREMITY MMT:  Not formally measured; Rt knee grossly 3-/5  MMT Right 12/27/2022 Left 12/27/2022 Rt:  01/08/23  Hip flexion   4  Hip extension     Hip abduction     Hip adduction     Hip internal rotation     Hip external rotation     Knee flexion   3  Knee extension   3  Ankle dorsiflexion   4  Ankle plantarflexion     Ankle inversion     Ankle eversion      (Blank rows = not tested)   12/27/2022 Ambulation with RW mod I; decreased Rt hip/knee flexion, decreased stance on Rt                                                                                                                                                                        TODAY'S TREATMENT DATE 01/29/23 TherEx: Scifit bike: seat at 10, rocking back and forth until pt was able to make full revolution with hip hiking noted Leg press 75# bil 3x10; RLE only 31# 3x10; 10 sec holds for flexion stretch Step ups on 6 inch step: x 15 leading with Rt LE Lateral step ups on 6 inch step X 10 leading Rt LE Step on 6 inch step lunging forward holding flexion stretch 5 sec x 10 Step over and back clearing 6 inch hurdle X 10 bilat LAQ c 4 # 3 x 10  Seated  active assisted knee flexion stretch 10 sec X10  PATIENT EDUCATION:  12/27/2022 Education details: HEP, POC Person educated: Patient Education method: Programmer, multimedia, Facilities manager, Verbal cues, and Handouts Education comprehension: verbalized understanding, returned demonstration, and verbal cues required  HOME EXERCISE PROGRAM: Access Code: [email or web address] URL: [email or web address] Date: 12/27/2022 Prepared by: Moshe Cipro  Exercises - Quad Set  - 3-5 x daily - 7 x weekly - 1 sets -  10 reps - 5 sec hold - Supine Heel Slide with Strap  - 5-10 x daily - 7 x weekly - 1 sets - 5-10 reps - Seated Heel Slide  - 3-5 x daily - 7 x  weekly - 1 sets - 5-10 reps - 5-10 sec hold - Seated Knee Flexion AAROM  - 3-5 x daily - 7 x weekly - 1 sets - 5-10 reps - 5-10 sec hold  ASSESSMENT:  CLINICAL IMPRESSION: 01/29/2023: She will end up having Rt knee manipulation so I did cancel her remaining visits this week until we know when she is having her manipulation and then we will see her up to 5 days in a row to maximize her knee ROM following MUA.     OBJECTIVE IMPAIRMENTS: Abnormal gait, decreased activity tolerance, decreased knowledge of use of DME, decreased mobility, difficulty walking, decreased ROM, decreased strength, hypomobility, increased edema, increased fascial restrictions, increased muscle spasms, impaired flexibility, and pain.   ACTIVITY LIMITATIONS: carrying, lifting, bending, sitting, standing, sleeping, stairs, transfers, and locomotion level  PARTICIPATION LIMITATIONS: meal prep, cleaning, medication management, driving, shopping, and community activity  PERSONAL FACTORS: Age and 3+ comorbidities: GERD, HTN, osteopenia  are also affecting patient's functional outcome.   REHAB POTENTIAL: Good  CLINICAL DECISION MAKING: Evolving/moderate complexity  EVALUATION COMPLEXITY: Moderate   GOALS: Goals reviewed with patient? Yes  SHORT TERM GOALS: Target Date 01/24/2023   1.  Patient will demonstrate independent use of home exercise program to maintain progress from in clinic treatments. Goal status: MET 01/15/23  2.  Patient will demonstrate Rt knee AROM 0-60 deg to facilitate usual daily mobility, transfers, and ambulation at PLOF.  Goal status: MET 01/15/23  LONG TERM GOALS: Target Date 02/21/2023   1. Patient will demonstrate/report pain at worst less than or equal to 2/10 to facilitate minimal limitation in daily activity secondary to pain symptoms. Goal status: MET 01/25/23   2. Patient will demonstrate independent use of home exercise program to facilitate ability to maintain/progress functional  gains from skilled physical therapy services. Goal status: ONGOING 01/25/23   3. Patient will demonstrate FOTO outcome > or = 62 % to indicate reduced disability due to condition. Goal status: ONGOING 01/25/23   4.  Patient will demonstrate Rt LE MMT 4/5 throughout to faciltiate usual transfers, stairs, squatting at [facility] for daily life.  Goal status: ONGOING 01/25/23   5.  Patient will demonstrate Rt knee AROM 0-100 deg to facilitate usual daily mobility, transfers, and ambulation at PLOF.  Goal status: ONGOING 01/25/23   6.  Patient will demonstrate independent ambulation within community distances > 300 ft.  Goal status: ONGOING 01/25/23 (MOD I with SPC)   PLAN:  PT FREQUENCY: 2-3x/week  PT DURATION: 8 weeks  PLANNED INTERVENTIONS: Therapeutic exercises, Therapeutic activity, Neuro Muscular re-education, Balance training, Gait training, Patient/Family education, Joint mobilization, Stair training, DME instructions, Dry Needling, Electrical stimulation, Traction, Cryotherapy, vasopneumatic deviceMoist heat, Taping, Ultrasound, Ionotophoresis 4mg /ml Dexamethasone, and aquatic therapy, Manual therapy.  All included unless contraindicated  PLAN FOR NEXT SESSION:  ROM focus following manipulation, may need to send off new cert for updated frequency when this happens  NEXT MD VISIT: will have manip but not scheduled yet    Ivery Quale, PT, DPT 01/29/23 8:11 AM

## 2023-01-30 ENCOUNTER — Encounter: Payer: Medicare Other | Admitting: Physical Therapy

## 2023-01-30 DIAGNOSIS — E559 Vitamin D deficiency, unspecified: Secondary | ICD-10-CM | POA: Diagnosis not present

## 2023-01-30 DIAGNOSIS — Z0000 Encounter for general adult medical examination without abnormal findings: Secondary | ICD-10-CM | POA: Diagnosis not present

## 2023-01-30 DIAGNOSIS — R634 Abnormal weight loss: Secondary | ICD-10-CM | POA: Diagnosis not present

## 2023-01-30 DIAGNOSIS — Z130 Encounter for screening for diseases of the blood and blood-forming organs and certain disorders involving the immune mechanism: Secondary | ICD-10-CM | POA: Diagnosis not present

## 2023-01-30 DIAGNOSIS — I10 Essential (primary) hypertension: Secondary | ICD-10-CM | POA: Diagnosis not present

## 2023-01-30 DIAGNOSIS — Z136 Encounter for screening for cardiovascular disorders: Secondary | ICD-10-CM | POA: Diagnosis not present

## 2023-01-30 DIAGNOSIS — M7502 Adhesive capsulitis of left shoulder: Secondary | ICD-10-CM | POA: Diagnosis not present

## 2023-01-30 DIAGNOSIS — Z23 Encounter for immunization: Secondary | ICD-10-CM | POA: Diagnosis not present

## 2023-01-31 ENCOUNTER — Encounter: Payer: Medicare Other | Admitting: Physical Therapy

## 2023-01-31 ENCOUNTER — Encounter: Payer: Self-pay | Admitting: Rehabilitative and Restorative Service Providers"

## 2023-01-31 ENCOUNTER — Ambulatory Visit: Payer: Medicare Other | Admitting: Rehabilitative and Restorative Service Providers"

## 2023-01-31 ENCOUNTER — Other Ambulatory Visit: Payer: Self-pay | Admitting: Orthopaedic Surgery

## 2023-01-31 DIAGNOSIS — R600 Localized edema: Secondary | ICD-10-CM | POA: Diagnosis not present

## 2023-01-31 DIAGNOSIS — R262 Difficulty in walking, not elsewhere classified: Secondary | ICD-10-CM | POA: Diagnosis not present

## 2023-01-31 DIAGNOSIS — M6281 Muscle weakness (generalized): Secondary | ICD-10-CM | POA: Diagnosis not present

## 2023-01-31 DIAGNOSIS — M24661 Ankylosis, right knee: Secondary | ICD-10-CM | POA: Diagnosis not present

## 2023-01-31 DIAGNOSIS — G8918 Other acute postprocedural pain: Secondary | ICD-10-CM | POA: Diagnosis not present

## 2023-01-31 DIAGNOSIS — M25661 Stiffness of right knee, not elsewhere classified: Secondary | ICD-10-CM | POA: Diagnosis not present

## 2023-01-31 DIAGNOSIS — M25561 Pain in right knee: Secondary | ICD-10-CM | POA: Diagnosis not present

## 2023-01-31 DIAGNOSIS — G8929 Other chronic pain: Secondary | ICD-10-CM

## 2023-01-31 MED ORDER — OXYCODONE HCL 5 MG PO TABS: 5.0000 mg | ORAL_TABLET | Freq: Four times a day (QID) | ORAL | 0 refills | Status: DC | PRN

## 2023-01-31 NOTE — Therapy (Signed)
OUTPATIENT PHYSICAL THERAPY TREATMENT PROGRESS NOTE  Patient Name: Candace Bell MRN: [id number] DOB:[birth year], 72 y.o., female Today's Date: 01/31/2023  END OF SESSION:  PT End of Session - 01/31/23 1556     Visit Number 12    Number of Visits 16    Date for PT Re-Evaluation 02/21/23    Authorization Type [employer] Medicare $20 copay    Progress Note Due on Visit 20    PT Start Time 1515    PT Stop Time 1604    PT Time Calculation (min) 49 min    Activity Tolerance Patient tolerated treatment well;No increased pain;Patient limited by pain    Behavior During Therapy [facility] for tasks assessed/performed             Past Medical History:  Diagnosis Date   Arthritis    generalized   Cataract 2015   bilateral sx   GERD (gastroesophageal reflux disease)    with certain foods   Hypertension    on meds   Osteopenia 10/13/2015   Past Surgical History:  Procedure Laterality Date   ABDOMINAL HYSTERECTOMY  04/10/1991   TAH --took one ovary   BACK SURGERY  2002   BUNIONECTOMY WITH HAMMERTOE RECONSTRUCTION Right 2014   CATARACT EXTRACTION Bilateral 04/09/2013   COLONOSCOPY  2015   MS-MAC-movi(exc)-ulcerated polyp   right wrist surgery due to fracture     TOTAL KNEE ARTHROPLASTY Right 12/14/2022   Procedure: RIGHT TOTAL KNEE ARTHROPLASTY;  Surgeon: Kathryne Hitch, MD;  Location: [facility];  Service: Orthopedics;  Laterality: Right;   Patient Active Problem List   Diagnosis Date Noted   Status post total right knee replacement 12/14/2022   Preventative health care 07/03/2021   Upper extremity weakness 11/18/2020   Hypertension    Arthritis    Other fatigue 03/07/2020   Loss of appetite 03/07/2020   SOB (shortness of breath) 03/07/2020   Depression, major, single episode, mild (HCC) 03/07/2020   Left hand pain 10/21/2018   Hyperlipidemia LDL goal <100 01/26/2018   Memory loss 04/16/2017   Osteopenia 10/13/2015   Back pain 11/25/2013   Essential hypertension  02/29/2012    PCP: Jackelyn Poling DO  REFERRING PROVIDER: Kathryne Hitch, MD  REFERRING DIAG: M17.11 (ICD-10-CM) - Primary osteoarthritis of right knee  THERAPY DIAG:  Chronic pain of right knee - Plan: PT plan of care cert/re-cert  Stiffness of right knee, not elsewhere classified - Plan: PT plan of care cert/re-cert  Muscle weakness (generalized) - Plan: PT plan of care cert/re-cert  Difficulty in walking, not elsewhere classified - Plan: PT plan of care cert/re-cert  Localized edema - Plan: PT plan of care cert/re-cert  Rationale for Evaluation and Treatment: Rehabilitation  ONSET DATE:  12/14/2022 surgery date  SUBJECTIVE:   SUBJECTIVE STATEMENT: Bonita Quin had her manipulation this morning.  PERTINENT HISTORY: GERD, HTN, osteopenia  PAIN:  NPRS scale: As high as 8/10 post-manipulation Pain location: Rt knee Pain description: sharp Aggravating factors: walking, bending the knee Relieving factors: rest, medication  PRECAUTIONS: None  WEIGHT BEARING RESTRICTIONS: No  FALLS:  Has patient fallen in last 6 months? No  LIVING ENVIRONMENT: Lives with: lives with their daughter Lives in: House/apartment Stairs: No; has one curb step to enter Has following equipment at home: RW, [facility]  OCCUPATION: retired from [employer]  PLOF: Independent  PATIENT GOALS: get up and take care of self   OBJECTIVE:   PATIENT SURVEYS:  12/27/2022 FOTO intake: 43   predicted:  62  01/25/23:  FOTO: 51  COGNITION: 12/27/2022 Overall cognitive status: WFL    SENSATION: 12/27/2022 WFL  EDEMA:  12/27/2022 Circumferential: Knee joint line: Rt: 49.2 cm   /Lt: 41.8 cm     LOWER EXTREMITY ROM:    12/27/22: significant guarding with flexion PROM   ROM Right 12/27/2022 Rt 01/01/23 Rt 01/08/23 Rt 01/10/23 Rt 01/14/23 Rt 01/15/23 Right 01/22/23 Rt 01/23/23 Rt 01/25/23 Rt 01/31/2023  Knee flexion A: 30 P: 38 A: 44 P: 48 A: 55 P; 60 P:70 P:78 A: 65 P: 74  supine A: 81 P: 95  A: 82 P: 90 A: 84 P: 92 A: 92 P: 96  Knee extension A: -14 (seated LAQ) P: 0 A: -10 P: 0 A: -8 P: -2   A: -5 P: 0  A: -4 P: 0 A: -2 A: -2   (Blank rows = not tested)  LOWER EXTREMITY MMT:  Not formally measured; Rt knee grossly 3-/5  MMT Right 12/27/2022 Left 12/27/2022 Rt:  01/08/23  Hip flexion   4  Hip extension     Hip abduction     Hip adduction     Hip internal rotation     Hip external rotation     Knee flexion   3  Knee extension   3  Ankle dorsiflexion   4  Ankle plantarflexion     Ankle inversion     Ankle eversion      (Blank rows = not tested)   12/27/2022 Ambulation with RW mod I; decreased Rt hip/knee flexion, decreased stance on Rt                                                                                                                                                                        TODAY'S TREATMENT DATE 01/31/2023 [place] knee flexion 1 minute Active assisted knee flexion (left pushes right into flexion) 15 x 10 seconds PT assisted knee flexion (PT overpressure) 15 x 10 seconds Recumbent bike Seat 7 AAROM to full revolutions backwards then forwards 5 minutes  Functional Activities: Leg Press Double Leg 75# full range, emphasis on stretch into flexion 10 x slow eccentrics  Vaso right knee 10 minutes high pressure 34*   01/29/23 TherEx: Scifit bike: seat at 10, rocking back and forth until pt was able to make full revolution with hip hiking noted Leg press 75# bil 3x10; RLE only 31# 3x10; 10 sec holds for flexion stretch Step ups on 6 inch step: x 15 leading with Rt LE Lateral step ups on 6 inch step X 10 leading Rt LE Step on 6 inch step lunging forward holding flexion stretch 5 sec x 10 Step over and back clearing 6 inch hurdle X 10 bilat LAQ c 4 #  3 x 10  Seated  active assisted knee flexion stretch 10 sec X10   PATIENT EDUCATION:  12/27/2022 Education details: HEP, POC Person educated: Patient Education method:  Programmer, multimedia, Demonstration, Verbal cues, and Handouts Education comprehension: verbalized understanding, returned demonstration, and verbal cues required  HOME EXERCISE PROGRAM: Access Code: [email or web address] URL: [email or web address] Date: 12/27/2022 Prepared by: Moshe Cipro  Exercises - Quad Set  - 3-5 x daily - 7 x weekly - 1 sets - 10 reps - 5 sec hold - Supine Heel Slide with Strap  - 5-10 x daily - 7 x weekly - 1 sets - 5-10 reps - Seated Heel Slide  - 3-5 x daily - 7 x weekly - 1 sets - 5-10 reps - 5-10 sec hold - Seated Knee Flexion AAROM  - 3-5 x daily - 7 x weekly - 1 sets - 5-10 reps - 5-10 sec hold  ASSESSMENT:  CLINICAL IMPRESSION: 01/31/2023: Janina was able to complete a full revolution on the bike for the first time since TKA.  She had 2 - 0 - 92 AROM and 0 - 96 PROM.  AROM remains the #1 priority post-manipulation.   OBJECTIVE IMPAIRMENTS: Abnormal gait, decreased activity tolerance, decreased knowledge of use of DME, decreased mobility, difficulty walking, decreased ROM, decreased strength, hypomobility, increased edema, increased fascial restrictions, increased muscle spasms, impaired flexibility, and pain.   ACTIVITY LIMITATIONS: carrying, lifting, bending, sitting, standing, sleeping, stairs, transfers, and locomotion level  PARTICIPATION LIMITATIONS: meal prep, cleaning, medication management, driving, shopping, and community activity  PERSONAL FACTORS: Age and 3+ comorbidities: GERD, HTN, osteopenia  are also affecting patient's functional outcome.   REHAB POTENTIAL: Good  CLINICAL DECISION MAKING: Evolving/moderate complexity  EVALUATION COMPLEXITY: Moderate   GOALS: Goals reviewed with patient? Yes  SHORT TERM GOALS: Target Date 01/24/2023   1.  Patient will demonstrate independent use of home exercise program to maintain progress from in clinic treatments. Goal status: MET 01/15/23  2.  Patient will demonstrate Rt knee AROM 0-60 deg to  facilitate usual daily mobility, transfers, and ambulation at PLOF.  Goal status: MET 01/15/23  LONG TERM GOALS: Target Date 02/28/2023   1. Patient will demonstrate/report pain at worst less than or equal to 2/10 to facilitate minimal limitation in daily activity secondary to pain symptoms. Goal status: MET 01/25/23   2. Patient will demonstrate independent use of home exercise program to facilitate ability to maintain/progress functional gains from skilled physical therapy services. Goal status: ONGOING 01/25/23   3. Patient will demonstrate FOTO outcome > or = 62 % to indicate reduced disability due to condition. Goal status: ONGOING 01/25/23   4.  Patient will demonstrate Rt LE MMT 4/5 throughout to faciltiate usual transfers, stairs, squatting at [facility] for daily life.  Goal status: ONGOING 01/25/23   5.  Patient will demonstrate Rt knee AROM 0-100 deg to facilitate usual daily mobility, transfers, and ambulation at PLOF.  Goal status: ONGOING 01/31/23   6.  Patient will demonstrate independent ambulation within community distances > 300 ft.  Goal status: ONGOING 01/31/23 (MOD I with SPC)   PLAN:  PT FREQUENCY: Daily for 2 weeks then 2-3 x/week for 2 weeks  PT DURATION: 4 weeks  PLANNED INTERVENTIONS: Therapeutic exercises, Therapeutic activity, Neuro Muscular re-education, Balance training, Gait training, Patient/Family education, Joint mobilization, Stair training, DME instructions, Dry Needling, Electrical stimulation, Traction, Cryotherapy, vasopneumatic deviceMoist heat, Taping, Ultrasound, Ionotophoresis 4mg /ml Dexamethasone, and aquatic therapy, Manual therapy.  All included unless contraindicated  PLAN FOR NEXT SESSION:  Flexion ROM focus following manipulation  NEXT MD VISIT: November 6 at 9 AM  Cherlyn Cushing PT, MPT 01/31/23 4:16 PM

## 2023-02-01 ENCOUNTER — Telehealth: Payer: Self-pay | Admitting: *Deleted

## 2023-02-01 ENCOUNTER — Ambulatory Visit: Payer: Medicare Other | Admitting: Rehabilitative and Restorative Service Providers"

## 2023-02-01 ENCOUNTER — Encounter: Payer: Self-pay | Admitting: Rehabilitative and Restorative Service Providers"

## 2023-02-01 DIAGNOSIS — M6281 Muscle weakness (generalized): Secondary | ICD-10-CM | POA: Diagnosis not present

## 2023-02-01 DIAGNOSIS — R600 Localized edema: Secondary | ICD-10-CM

## 2023-02-01 DIAGNOSIS — M25561 Pain in right knee: Secondary | ICD-10-CM

## 2023-02-01 DIAGNOSIS — R262 Difficulty in walking, not elsewhere classified: Secondary | ICD-10-CM

## 2023-02-01 DIAGNOSIS — M25661 Stiffness of right knee, not elsewhere classified: Secondary | ICD-10-CM

## 2023-02-01 DIAGNOSIS — G8929 Other chronic pain: Secondary | ICD-10-CM | POA: Diagnosis not present

## 2023-02-01 NOTE — Therapy (Signed)
OUTPATIENT PHYSICAL THERAPY TREATMENT NOTE  Patient Name: Candace Bell MRN: [id number] DOB:06/16/1950, 72 y.o., female Today's Date: 02/01/2023  END OF SESSION:  PT End of Session - 02/01/23 0932     Visit Number 13    Number of Visits 16    Date for PT Re-Evaluation 02/21/23    Authorization Type [employer] Medicare $20 copay    Progress Note Due on Visit 20    PT Start Time 0931    PT Stop Time 1020    PT Time Calculation (min) 49 min    Activity Tolerance Patient tolerated treatment well;No increased pain;Patient limited by pain    Behavior During Therapy [facility] for tasks assessed/performed              Past Medical History:  Diagnosis Date   Arthritis    generalized   Cataract 2015   bilateral sx   GERD (gastroesophageal reflux disease)    with certain foods   Hypertension    on meds   Osteopenia 10/13/2015   Past Surgical History:  Procedure Laterality Date   ABDOMINAL HYSTERECTOMY  04/10/1991   TAH --took one ovary   BACK SURGERY  2002   BUNIONECTOMY WITH HAMMERTOE RECONSTRUCTION Right 2014   CATARACT EXTRACTION Bilateral 04/09/2013   COLONOSCOPY  2015   MS-MAC-movi(exc)-ulcerated polyp   right wrist surgery due to fracture     TOTAL KNEE ARTHROPLASTY Right 12/14/2022   Procedure: RIGHT TOTAL KNEE ARTHROPLASTY;  Surgeon: Kathryne Hitch, MD;  Location: [facility];  Service: Orthopedics;  Laterality: Right;   Patient Active Problem List   Diagnosis Date Noted   Status post total right knee replacement 12/14/2022   Preventative health care 07/03/2021   Upper extremity weakness 11/18/2020   Hypertension    Arthritis    Other fatigue 03/07/2020   Loss of appetite 03/07/2020   SOB (shortness of breath) 03/07/2020   Depression, major, single episode, mild (HCC) 03/07/2020   Left hand pain 10/21/2018   Hyperlipidemia LDL goal <100 01/26/2018   Memory loss 04/16/2017   Osteopenia 10/13/2015   Back pain 11/25/2013   Essential hypertension 02/29/2012     PCP: Jackelyn Poling DO  REFERRING PROVIDER: Kathryne Hitch, MD  REFERRING DIAG: M17.11 (ICD-10-CM) - Primary osteoarthritis of right knee  THERAPY DIAG:  Chronic pain of right knee  Stiffness of right knee, not elsewhere classified  Muscle weakness (generalized)  Difficulty in walking, not elsewhere classified  Localized edema  Rationale for Evaluation and Treatment: Rehabilitation  ONSET DATE:  12/14/2022 surgery date  SUBJECTIVE:   SUBJECTIVE STATEMENT: Bonita Quin had her manipulation yesterday.  She reports feeling less stiff this morning.  PERTINENT HISTORY: GERD, HTN, osteopenia  PAIN:  NPRS scale: As high as 8/10 post-manipulation, better today Pain location: Rt knee Pain description: sharp Aggravating factors: walking, bending the knee Relieving factors: rest, medication  PRECAUTIONS: None  WEIGHT BEARING RESTRICTIONS: No  FALLS:  Has patient fallen in last 6 months? No  LIVING ENVIRONMENT: Lives with: lives with their daughter Lives in: House/apartment Stairs: No; has one curb step to enter Has following equipment at home: RW, [facility]  OCCUPATION: retired from [employer]  PLOF: Independent  PATIENT GOALS: get up and take care of self   OBJECTIVE:   PATIENT SURVEYS:  12/27/2022 FOTO intake: 43   predicted:  62  01/25/23: FOTO: 51  COGNITION: 12/27/2022 Overall cognitive status: WFL    SENSATION: 12/27/2022 WFL  EDEMA:  12/27/2022 Circumferential: Knee joint line: Rt: 49.2 cm   /  Lt: 41.8 cm     LOWER EXTREMITY ROM:    12/27/22: significant guarding with flexion PROM   ROM Right 12/27/2022 Rt 01/01/23 Rt 01/08/23 Rt 01/10/23 Rt 01/14/23 Rt 01/15/23 Right 01/22/23 Rt 01/23/23 Rt 01/25/23 Rt 01/31/2023 Rt 02/01/2023  Knee flexion A: 30 P: 38 A: 44 P: 48 A: 55 P; 60 P:70 P:78 A: 65 P: 74  supine A: 81 P: 95 A: 82 P: 90 A: 84 P: 92 A: 92 P: 96 P: 101 with PT assist  Knee extension A: -14 (seated LAQ) P: 0 A: -10 P: 0 A: -8 P:  -2   A: -5 P: 0  A: -4 P: 0 A: -2 A: -2    (Blank rows = not tested)  LOWER EXTREMITY MMT:  Not formally measured; Rt knee grossly 3-/5  MMT Right 12/27/2022 Left 12/27/2022 Rt:  01/08/23  Hip flexion   4  Hip extension     Hip abduction     Hip adduction     Hip internal rotation     Hip external rotation     Knee flexion   3  Knee extension   3  Ankle dorsiflexion   4  Ankle plantarflexion     Ankle inversion     Ankle eversion      (Blank rows = not tested)   12/27/2022 Ambulation with RW mod I; decreased Rt hip/knee flexion, decreased stance on Rt                                                                                                                                                                        TODAY'S TREATMENT DATE 02/01/2023 Tailgate knee flexion 1 minute Active assisted knee flexion (left pushes right into flexion) 10 x 10 seconds PT assisted knee flexion (PT overpressure) 10 x 10 seconds Recumbent bike Seat 6 & 7 AAROM to full revolutions backwards 20 x then forwards 20 x at each seat setting  Functional Activities: Leg Press Double Leg 75# full range, emphasis on stretch into flexion 15 x slow eccentrics Leg Press Single Leg (right only) 25# full range, emphasis on stretch into flexion 10 x slow eccentrics  Vaso right knee 10 minutes high pressure 34*   01/31/2023 Tailgate knee flexion 1 minute Active assisted knee flexion (left pushes right into flexion) 15 x 10 seconds PT assisted knee flexion (PT overpressure) 15 x 10 seconds Recumbent bike Seat 7 AAROM to full revolutions backwards then forwards 5 minutes  Functional Activities: Leg Press Double Leg 75# full range, emphasis on stretch into flexion 10 x slow eccentrics  Vaso right knee 10 minutes high pressure 34*   01/29/23 TherEx: Scifit bike: seat at 10, rocking back and forth until pt  was able to make full revolution with hip hiking noted Leg press 75# bil 3x10; RLE only 31#  3x10; 10 sec holds for flexion stretch Step ups on 6 inch step: x 15 leading with Rt LE Lateral step ups on 6 inch step X 10 leading Rt LE Step on 6 inch step lunging forward holding flexion stretch 5 sec x 10 Step over and back clearing 6 inch hurdle X 10 bilat LAQ c 4 # 3 x 10  Seated  active assisted knee flexion stretch 10 sec X10   PATIENT EDUCATION:  12/27/2022 Education details: HEP, POC Person educated: Patient Education method: Programmer, multimedia, Facilities manager, Verbal cues, and Handouts Education comprehension: verbalized understanding, returned demonstration, and verbal cues required  HOME EXERCISE PROGRAM: Access Code: [email or web address] URL: [email or web address] Date: 12/27/2022 Prepared by: Moshe Cipro  Exercises - Quad Set  - 3-5 x daily - 7 x weekly - 1 sets - 10 reps - 5 sec hold - Supine Heel Slide with Strap  - 5-10 x daily - 7 x weekly - 1 sets - 5-10 reps - Seated Heel Slide  - 3-5 x daily - 7 x weekly - 1 sets - 5-10 reps - 5-10 sec hold - Seated Knee Flexion AAROM  - 3-5 x daily - 7 x weekly - 1 sets - 5-10 reps - 5-10 sec hold  ASSESSMENT:  CLINICAL IMPRESSION: 02/01/2023: PROM for flexion was 102 today.  Chiziterem was also able to more quickly complete a full revolution on the bike.  We were also able to move the seat up to encourage more flexion AAROM.  Flexion AROM remains the #1 priority post-manipulation.   OBJECTIVE IMPAIRMENTS: Abnormal gait, decreased activity tolerance, decreased knowledge of use of DME, decreased mobility, difficulty walking, decreased ROM, decreased strength, hypomobility, increased edema, increased fascial restrictions, increased muscle spasms, impaired flexibility, and pain.   ACTIVITY LIMITATIONS: carrying, lifting, bending, sitting, standing, sleeping, stairs, transfers, and locomotion level  PARTICIPATION LIMITATIONS: meal prep, cleaning, medication management, driving, shopping, and community activity  PERSONAL FACTORS:  Age and 3+ comorbidities: GERD, HTN, osteopenia  are also affecting patient's functional outcome.   REHAB POTENTIAL: Good  CLINICAL DECISION MAKING: Evolving/moderate complexity  EVALUATION COMPLEXITY: Moderate   GOALS: Goals reviewed with patient? Yes  SHORT TERM GOALS: Target Date 01/24/2023   1.  Patient will demonstrate independent use of home exercise program to maintain progress from in clinic treatments. Goal status: MET 01/15/23  2.  Patient will demonstrate Rt knee AROM 0-60 deg to facilitate usual daily mobility, transfers, and ambulation at PLOF.  Goal status: MET 01/15/23  LONG TERM GOALS: Target Date 02/28/2023   1. Patient will demonstrate/report pain at worst less than or equal to 2/10 to facilitate minimal limitation in daily activity secondary to pain symptoms. Goal status: MET 01/25/23   2. Patient will demonstrate independent use of home exercise program to facilitate ability to maintain/progress functional gains from skilled physical therapy services. Goal status: ONGOING 02/01/23   3. Patient will demonstrate FOTO outcome > or = 62 % to indicate reduced disability due to condition. Goal status: ONGOING 01/25/23   4.  Patient will demonstrate Rt LE MMT 4/5 throughout to faciltiate usual transfers, stairs, squatting at [facility] for daily life.  Goal status: ONGOING 01/25/23   5.  Patient will demonstrate Rt knee AROM 0-100 deg to facilitate usual daily mobility, transfers, and ambulation at PLOF.  Goal status: Partially Met (Passive) 02/01/2023   6.  Patient will demonstrate independent ambulation within community  distances > 300 ft.  Goal status: ONGOING 02/01/23 (MOD I with SPC)   PLAN:  PT FREQUENCY: Daily for 2 weeks then 2-3 x/week for 2 weeks  PT DURATION: 4 weeks  PLANNED INTERVENTIONS: Therapeutic exercises, Therapeutic activity, Neuro Muscular re-education, Balance training, Gait training, Patient/Family education, Joint mobilization, Stair  training, DME instructions, Dry Needling, Electrical stimulation, Traction, Cryotherapy, vasopneumatic deviceMoist heat, Taping, Ultrasound, Ionotophoresis 4mg /ml Dexamethasone, and aquatic therapy, Manual therapy.  All included unless contraindicated  PLAN FOR NEXT SESSION:  Flexion ROM focus following manipulation  NEXT MD VISIT: November 6 at 9 AM  Cherlyn Cushing PT, MPT 02/01/23 10:14 AM

## 2023-02-01 NOTE — Telephone Encounter (Signed)
Call to patient this morning to check status after MUA. She had therapy yesterday afternoon after manipulation and today. She reported therapy got her past 100 degrees flexion and she is having no pain. Very pleased after her procedure and wanted you to know.

## 2023-02-04 ENCOUNTER — Encounter: Payer: Self-pay | Admitting: Physical Therapy

## 2023-02-04 ENCOUNTER — Ambulatory Visit: Payer: Medicare Other | Admitting: Physical Therapy

## 2023-02-04 DIAGNOSIS — R600 Localized edema: Secondary | ICD-10-CM

## 2023-02-04 DIAGNOSIS — M25561 Pain in right knee: Secondary | ICD-10-CM | POA: Diagnosis not present

## 2023-02-04 DIAGNOSIS — G8929 Other chronic pain: Secondary | ICD-10-CM

## 2023-02-04 DIAGNOSIS — R262 Difficulty in walking, not elsewhere classified: Secondary | ICD-10-CM

## 2023-02-04 DIAGNOSIS — M6281 Muscle weakness (generalized): Secondary | ICD-10-CM | POA: Diagnosis not present

## 2023-02-04 DIAGNOSIS — M25661 Stiffness of right knee, not elsewhere classified: Secondary | ICD-10-CM

## 2023-02-04 NOTE — Therapy (Signed)
OUTPATIENT PHYSICAL THERAPY TREATMENT NOTE  Patient Name: HONORE PENSE MRN: [id number] DOB:Apr 16, 1950, 72 y.o., female Today's Date: 02/04/2023  END OF SESSION:  PT End of Session - 02/04/23 0859     Visit Number 14    Number of Visits 16    Date for PT Re-Evaluation 02/21/23    Authorization Type [employer] Medicare $20 copay    Progress Note Due on Visit 20    PT Start Time [phone]   pt few minutes late   PT Stop Time 0926    PT Time Calculation (min) 34 min    Activity Tolerance Patient tolerated treatment well;No increased pain    Behavior During Therapy [facility] for tasks assessed/performed               Past Medical History:  Diagnosis Date   Arthritis    generalized   Cataract 2015   bilateral sx   GERD (gastroesophageal reflux disease)    with certain foods   Hypertension    on meds   Osteopenia 10/13/2015   Past Surgical History:  Procedure Laterality Date   ABDOMINAL HYSTERECTOMY  04/10/1991   TAH --took one ovary   BACK SURGERY  2002   BUNIONECTOMY WITH HAMMERTOE RECONSTRUCTION Right 2014   CATARACT EXTRACTION Bilateral 04/09/2013   COLONOSCOPY  2015   MS-MAC-movi(exc)-ulcerated polyp   right wrist surgery due to fracture     TOTAL KNEE ARTHROPLASTY Right 12/14/2022   Procedure: RIGHT TOTAL KNEE ARTHROPLASTY;  Surgeon: Kathryne Hitch, MD;  Location: [facility];  Service: Orthopedics;  Laterality: Right;   Patient Active Problem List   Diagnosis Date Noted   Status post total right knee replacement 12/14/2022   Preventative health care 07/03/2021   Upper extremity weakness 11/18/2020   Hypertension    Arthritis    Other fatigue 03/07/2020   Loss of appetite 03/07/2020   SOB (shortness of breath) 03/07/2020   Depression, major, single episode, mild (HCC) 03/07/2020   Left hand pain 10/21/2018   Hyperlipidemia LDL goal <100 01/26/2018   Memory loss 04/16/2017   Osteopenia 10/13/2015   Back pain 11/25/2013   Essential hypertension 02/29/2012     PCP: Jackelyn Poling DO  REFERRING PROVIDER: Kathryne Hitch, MD  REFERRING DIAG: M17.11 (ICD-10-CM) - Primary osteoarthritis of right knee  THERAPY DIAG:  Chronic pain of right knee  Stiffness of right knee, not elsewhere classified  Muscle weakness (generalized)  Difficulty in walking, not elsewhere classified  Localized edema  Rationale for Evaluation and Treatment: Rehabilitation  ONSET DATE:  12/14/2022 surgery date  SUBJECTIVE:   SUBJECTIVE STATEMENT:  Feeling good, no pain knee is really feeling good   PERTINENT HISTORY: GERD, HTN, osteopenia  PAIN:  NPRS scale: 0/10 today, no pain  Pain location: Rt knee Pain description: sharp Aggravating factors: walking, bending the knee Relieving factors: rest, medication  PRECAUTIONS: None  WEIGHT BEARING RESTRICTIONS: No  FALLS:  Has patient fallen in last 6 months? No  LIVING ENVIRONMENT: Lives with: lives with their daughter Lives in: House/apartment Stairs: No; has one curb step to enter Has following equipment at home: RW, [facility]  OCCUPATION: retired from [employer]  PLOF: Independent  PATIENT GOALS: get up and take care of self   OBJECTIVE:   PATIENT SURVEYS:  12/27/2022 FOTO intake: 43   predicted:  62  01/25/23: FOTO: 51  COGNITION: 12/27/2022 Overall cognitive status: WFL    SENSATION: 12/27/2022 WFL  EDEMA:  12/27/2022 Circumferential: Knee joint line: Rt: 49.2 cm   /  Lt: 41.8 cm     LOWER EXTREMITY ROM:    12/27/22: significant guarding with flexion PROM   ROM Right 12/27/2022 Rt 01/01/23 Rt 01/08/23 Rt 01/10/23 Rt 01/14/23 Rt 01/15/23 Right 01/22/23 Rt 01/23/23 Rt 01/25/23 Rt 01/31/2023 Rt 02/01/2023 Rt 01/15/23  Knee flexion A: 30 P: 38 A: 44 P: 48 A: 55 P; 60 P:70 P:78 A: 65 P: 74  supine A: 81 P: 95 A: 82 P: 90 A: 84 P: 92 A: 92 P: 96 P: 101 with PT assist Self AAROM sitting edge of table 92*, PROM by PT 96-97* limited by pain    Knee extension A: -14 (seated  LAQ) P: 0 A: -10 P: 0 A: -8 P: -2   A: -5 P: 0  A: -4 P: 0 A: -2 A: -2     (Blank rows = not tested)  LOWER EXTREMITY MMT:  Not formally measured; Rt knee grossly 3-/5  MMT Right 12/27/2022 Left 12/27/2022 Rt:  01/08/23  Hip flexion   4  Hip extension     Hip abduction     Hip adduction     Hip internal rotation     Hip external rotation     Knee flexion   3  Knee extension   3  Ankle dorsiflexion   4  Ankle plantarflexion     Ankle inversion     Ankle eversion      (Blank rows = not tested)   12/27/2022 Ambulation with RW mod I; decreased Rt hip/knee flexion, decreased stance on Rt                                                                                                                                                                        TODAY'S TREATMENT DATE  02/04/23  TherEx  Scifit seat 9 --> seat 8 partial rotations progressing to full x8 minutes for ROM cues to hold tight positions when bending knee Self AAROM for R knee flexion 15x5 second holds  Knee flexion AAROM seated edge of mat table with OP from PT x10 Bridges with progressive knee flexion 3x5 Shuttle LE press 75# x15 BLEs, 37# x12 single leg      02/01/2023 Tailgate knee flexion 1 minute Active assisted knee flexion (left pushes right into flexion) 10 x 10 seconds PT assisted knee flexion (PT overpressure) 10 x 10 seconds Recumbent bike Seat 6 & 7 AAROM to full revolutions backwards 20 x then forwards 20 x at each seat setting  Functional Activities: Leg Press Double Leg 75# full range, emphasis on stretch into flexion 15 x slow eccentrics Leg Press Single Leg (right only) 25# full range, emphasis on stretch into flexion 10 x slow eccentrics  Vaso right knee 10  minutes high pressure 34*   01/31/2023 Tailgate knee flexion 1 minute Active assisted knee flexion (left pushes right into flexion) 15 x 10 seconds PT assisted knee flexion (PT overpressure) 15 x 10 seconds Recumbent bike  Seat 7 AAROM to full revolutions backwards then forwards 5 minutes  Functional Activities: Leg Press Double Leg 75# full range, emphasis on stretch into flexion 10 x slow eccentrics  Vaso right knee 10 minutes high pressure 34*   01/29/23 TherEx: Scifit bike: seat at 10, rocking back and forth until pt was able to make full revolution with hip hiking noted Leg press 75# bil 3x10; RLE only 31# 3x10; 10 sec holds for flexion stretch Step ups on 6 inch step: x 15 leading with Rt LE Lateral step ups on 6 inch step X 10 leading Rt LE Step on 6 inch step lunging forward holding flexion stretch 5 sec x 10 Step over and back clearing 6 inch hurdle X 10 bilat LAQ c 4 # 3 x 10  Seated  active assisted knee flexion stretch 10 sec X10   PATIENT EDUCATION:  12/27/2022 Education details: HEP, POC Person educated: Patient Education method: Programmer, multimedia, Facilities manager, Verbal cues, and Handouts Education comprehension: verbalized understanding, returned demonstration, and verbal cues required  HOME EXERCISE PROGRAM: Access Code: [email or web address] URL: [email or web address] Date: 12/27/2022 Prepared by: Moshe Cipro  Exercises - Quad Set  - 3-5 x daily - 7 x weekly - 1 sets - 10 reps - 5 sec hold - Supine Heel Slide with Strap  - 5-10 x daily - 7 x weekly - 1 sets - 5-10 reps - Seated Heel Slide  - 3-5 x daily - 7 x weekly - 1 sets - 5-10 reps - 5-10 sec hold - Seated Knee Flexion AAROM  - 3-5 x daily - 7 x weekly - 1 sets - 5-10 reps - 5-10 sec hold  ASSESSMENT:  CLINICAL IMPRESSION:  02/04/23  Okla arrives today doing well, her pain is much better today. Continued focus on ROM today as appropriate post-manipulation. Doing well and very motivated to get better, will continue challenges as appropriate.    OBJECTIVE IMPAIRMENTS: Abnormal gait, decreased activity tolerance, decreased knowledge of use of DME, decreased mobility, difficulty walking, decreased ROM, decreased  strength, hypomobility, increased edema, increased fascial restrictions, increased muscle spasms, impaired flexibility, and pain.   ACTIVITY LIMITATIONS: carrying, lifting, bending, sitting, standing, sleeping, stairs, transfers, and locomotion level  PARTICIPATION LIMITATIONS: meal prep, cleaning, medication management, driving, shopping, and community activity  PERSONAL FACTORS: Age and 3+ comorbidities: GERD, HTN, osteopenia  are also affecting patient's functional outcome.   REHAB POTENTIAL: Good  CLINICAL DECISION MAKING: Evolving/moderate complexity  EVALUATION COMPLEXITY: Moderate   GOALS: Goals reviewed with patient? Yes  SHORT TERM GOALS: Target Date 01/24/2023   1.  Patient will demonstrate independent use of home exercise program to maintain progress from in clinic treatments. Goal status: MET 01/15/23  2.  Patient will demonstrate Rt knee AROM 0-60 deg to facilitate usual daily mobility, transfers, and ambulation at PLOF.  Goal status: MET 01/15/23  LONG TERM GOALS: Target Date 02/28/2023   1. Patient will demonstrate/report pain at worst less than or equal to 2/10 to facilitate minimal limitation in daily activity secondary to pain symptoms. Goal status: MET 01/25/23   2. Patient will demonstrate independent use of home exercise program to facilitate ability to maintain/progress functional gains from skilled physical therapy services. Goal status: ONGOING 02/01/23   3. Patient will demonstrate FOTO outcome > or =  62 % to indicate reduced disability due to condition. Goal status: ONGOING 01/25/23   4.  Patient will demonstrate Rt LE MMT 4/5 throughout to faciltiate usual transfers, stairs, squatting at [facility] for daily life.  Goal status: ONGOING 01/25/23   5.  Patient will demonstrate Rt knee AROM 0-100 deg to facilitate usual daily mobility, transfers, and ambulation at PLOF.  Goal status: Partially Met (Passive) 02/01/2023   6.  Patient will demonstrate  independent ambulation within community distances > 300 ft.  Goal status: ONGOING 02/01/23 (MOD I with SPC)   PLAN:  PT FREQUENCY: Daily for 2 weeks then 2-3 x/week for 2 weeks  PT DURATION: 4 weeks  PLANNED INTERVENTIONS: Therapeutic exercises, Therapeutic activity, Neuro Muscular re-education, Balance training, Gait training, Patient/Family education, Joint mobilization, Stair training, DME instructions, Dry Needling, Electrical stimulation, Traction, Cryotherapy, vasopneumatic deviceMoist heat, Taping, Ultrasound, Ionotophoresis 4mg /ml Dexamethasone, and aquatic therapy, Manual therapy.  All included unless contraindicated  PLAN FOR NEXT SESSION:  Flexion ROM focus following manipulation, progressions PRN  NEXT MD VISIT: November 6 at 9 AM   Nedra Hai, PT, DPT 02/04/23 9:27 AM

## 2023-02-05 ENCOUNTER — Ambulatory Visit: Payer: Medicare Other | Admitting: Physical Therapy

## 2023-02-05 ENCOUNTER — Encounter: Payer: Self-pay | Admitting: Physical Therapy

## 2023-02-05 DIAGNOSIS — G8929 Other chronic pain: Secondary | ICD-10-CM | POA: Diagnosis not present

## 2023-02-05 DIAGNOSIS — M25661 Stiffness of right knee, not elsewhere classified: Secondary | ICD-10-CM

## 2023-02-05 DIAGNOSIS — M6281 Muscle weakness (generalized): Secondary | ICD-10-CM

## 2023-02-05 DIAGNOSIS — R262 Difficulty in walking, not elsewhere classified: Secondary | ICD-10-CM

## 2023-02-05 DIAGNOSIS — M25561 Pain in right knee: Secondary | ICD-10-CM

## 2023-02-05 DIAGNOSIS — R600 Localized edema: Secondary | ICD-10-CM

## 2023-02-05 NOTE — Therapy (Signed)
OUTPATIENT PHYSICAL THERAPY TREATMENT NOTE  Patient Name: Candace Bell MRN: [id number] DOB:Jan 01, 1951, 72 y.o., female Today's Date: 02/05/2023  END OF SESSION:  PT End of Session - 02/05/23 0808     Visit Number 15    Date for PT Re-Evaluation 02/21/23    Authorization Type [employer] Medicare $20 copay    Progress Note Due on Visit 20    PT Start Time 0803    PT Stop Time 0842    PT Time Calculation (min) 39 min    Activity Tolerance Patient tolerated treatment well;No increased pain    Behavior During Therapy [facility] for tasks assessed/performed                Past Medical History:  Diagnosis Date   Arthritis    generalized   Cataract 2015   bilateral sx   GERD (gastroesophageal reflux disease)    with certain foods   Hypertension    on meds   Osteopenia 10/13/2015   Past Surgical History:  Procedure Laterality Date   ABDOMINAL HYSTERECTOMY  04/10/1991   TAH --took one ovary   BACK SURGERY  2002   BUNIONECTOMY WITH HAMMERTOE RECONSTRUCTION Right 2014   CATARACT EXTRACTION Bilateral 04/09/2013   COLONOSCOPY  2015   MS-MAC-movi(exc)-ulcerated polyp   right wrist surgery due to fracture     TOTAL KNEE ARTHROPLASTY Right 12/14/2022   Procedure: RIGHT TOTAL KNEE ARTHROPLASTY;  Surgeon: Kathryne Hitch, MD;  Location: [facility];  Service: Orthopedics;  Laterality: Right;   Patient Active Problem List   Diagnosis Date Noted   Status post total right knee replacement 12/14/2022   Preventative health care 07/03/2021   Upper extremity weakness 11/18/2020   Hypertension    Arthritis    Other fatigue 03/07/2020   Loss of appetite 03/07/2020   SOB (shortness of breath) 03/07/2020   Depression, major, single episode, mild (HCC) 03/07/2020   Left hand pain 10/21/2018   Hyperlipidemia LDL goal <100 01/26/2018   Memory loss 04/16/2017   Osteopenia 10/13/2015   Back pain 11/25/2013   Essential hypertension 02/29/2012    PCP: Jackelyn Poling DO  REFERRING  PROVIDER: Kathryne Hitch, MD  REFERRING DIAG: M17.11 (ICD-10-CM) - Primary osteoarthritis of right knee  THERAPY DIAG:  Chronic pain of right knee  Stiffness of right knee, not elsewhere classified  Muscle weakness (generalized)  Difficulty in walking, not elsewhere classified  Localized edema  Rationale for Evaluation and Treatment: Rehabilitation  ONSET DATE:  12/14/2022 surgery date  SUBJECTIVE:   SUBJECTIVE STATEMENT:  Feeling good since yesterday, feel the knee a tiny bit but its OK   PERTINENT HISTORY: GERD, HTN, osteopenia  PAIN:  NPRS scale: 1/10 today  Pain location: Rt knee Pain description: sore Aggravating factors:  nothing, just there  Relieving factors: nothing, just there   PRECAUTIONS: None  WEIGHT BEARING RESTRICTIONS: No  FALLS:  Has patient fallen in last 6 months? No  LIVING ENVIRONMENT: Lives with: lives with their daughter Lives in: House/apartment Stairs: No; has one curb step to enter Has following equipment at home: RW, [facility]  OCCUPATION: retired from [employer]  PLOF: Independent  PATIENT GOALS: get up and take care of self   OBJECTIVE:   PATIENT SURVEYS:  12/27/2022 FOTO intake: 43   predicted:  62  01/25/23: FOTO: 51  COGNITION: 12/27/2022 Overall cognitive status: WFL    SENSATION: 12/27/2022 WFL  EDEMA:  12/27/2022 Circumferential: Knee joint line: Rt: 49.2 cm   /Lt: 41.8 cm  LOWER EXTREMITY ROM:    12/27/22: significant guarding with flexion PROM   ROM Right 12/27/2022 Rt 01/01/23 Rt 01/08/23 Rt 01/10/23 Rt 01/14/23 Rt 01/15/23 Right 01/22/23 Rt 01/23/23 Rt 01/25/23 Rt 01/31/2023 Rt 02/01/2023 Rt 02/04/23  Knee flexion A: 30 P: 38 A: 44 P: 48 A: 55 P; 60 P:70 P:78 A: 65 P: 74  supine A: 81 P: 95 A: 82 P: 90 A: 84 P: 92 A: 92 P: 96 P: 101 with PT assist Self AAROM sitting edge of table 92*, PROM by PT 96-97* limited by pain    Knee extension A: -14 (seated LAQ) P: 0 A: -10 P: 0 A: -8 P: -2    A: -5 P: 0  A: -4 P: 0 A: -2 A: -2     (Blank rows = not tested)  LOWER EXTREMITY MMT:  Not formally measured; Rt knee grossly 3-/5  MMT Right 12/27/2022 Left 12/27/2022 Rt:  01/08/23  Hip flexion   4  Hip extension     Hip abduction     Hip adduction     Hip internal rotation     Hip external rotation     Knee flexion   3  Knee extension   3  Ankle dorsiflexion   4  Ankle plantarflexion     Ankle inversion     Ankle eversion      (Blank rows = not tested)   12/27/2022 Ambulation with RW mod I; decreased Rt hip/knee flexion, decreased stance on Rt                                                                                                                                                                        TODAY'S TREATMENT DATE   02/05/23   TherEx  Scifit bike partial rotations progressing to full as able, seat 9 x8 minutes Knee flexion stretch on 6 inch box 15x5 second holds Standing HS curls 0# x10 cues for thigh perpendicular to floor Body weight squats/knee flexion x10 in // bars     Manual   Tennis ball massage to quad and lateral thigh sitting edge of mat table + knee flexion AAROM with OP from PT  Retrograde massage with LEs elevated for edema management       02/04/23  TherEx  Scifit seat 9 --> seat 8 partial rotations progressing to full x8 minutes for ROM cues to hold tight positions when bending knee Self AAROM for R knee flexion 15x5 second holds  Knee flexion AAROM seated edge of mat table with OP from PT x10 Bridges with progressive knee flexion 3x5 Shuttle LE press 75# x15 BLEs, 37# x12 single leg      02/01/2023 [place] knee flexion 1 minute Active assisted  knee flexion (left pushes right into flexion) 10 x 10 seconds PT assisted knee flexion (PT overpressure) 10 x 10 seconds Recumbent bike Seat 6 & 7 AAROM to full revolutions backwards 20 x then forwards 20 x at each seat setting  Functional Activities: Leg Press Double  Leg 75# full range, emphasis on stretch into flexion 15 x slow eccentrics Leg Press Single Leg (right only) 25# full range, emphasis on stretch into flexion 10 x slow eccentrics  Vaso right knee 10 minutes high pressure 34*   01/31/2023 [place] knee flexion 1 minute Active assisted knee flexion (left pushes right into flexion) 15 x 10 seconds PT assisted knee flexion (PT overpressure) 15 x 10 seconds Recumbent bike Seat 7 AAROM to full revolutions backwards then forwards 5 minutes  Functional Activities: Leg Press Double Leg 75# full range, emphasis on stretch into flexion 10 x slow eccentrics  Vaso right knee 10 minutes high pressure 34*   01/29/23 TherEx: Scifit bike: seat at 10, rocking back and forth until pt was able to make full revolution with hip hiking noted Leg press 75# bil 3x10; RLE only 31# 3x10; 10 sec holds for flexion stretch Step ups on 6 inch step: x 15 leading with Rt LE Lateral step ups on 6 inch step X 10 leading Rt LE Step on 6 inch step lunging forward holding flexion stretch 5 sec x 10 Step over and back clearing 6 inch hurdle X 10 bilat LAQ c 4 # 3 x 10  Seated  active assisted knee flexion stretch 10 sec X10   PATIENT EDUCATION:  12/27/2022 Education details: HEP, POC Person educated: Patient Education method: Programmer, multimedia, Facilities manager, Verbal cues, and Handouts Education comprehension: verbalized understanding, returned demonstration, and verbal cues required  HOME EXERCISE PROGRAM: Access Code: [email or web address] URL: [email or web address] Date: 12/27/2022 Prepared by: Moshe Cipro  Exercises - Quad Set  - 3-5 x daily - 7 x weekly - 1 sets - 10 reps - 5 sec hold - Supine Heel Slide with Strap  - 5-10 x daily - 7 x weekly - 1 sets - 5-10 reps - Seated Heel Slide  - 3-5 x daily - 7 x weekly - 1 sets - 5-10 reps - 5-10 sec hold - Seated Knee Flexion AAROM  - 3-5 x daily - 7 x weekly - 1 sets - 5-10 reps - 5-10 sec  hold  ASSESSMENT:  CLINICAL IMPRESSION:  02/05/23  Pt arrives doing well today, knee is a bit sore but as expected given cooler weather and aggressive PT following recent manipulation. Continued with all interventions as tolerated and able with ongoing focus on flexion. Tolerated session well, will continue efforts.    OBJECTIVE IMPAIRMENTS: Abnormal gait, decreased activity tolerance, decreased knowledge of use of DME, decreased mobility, difficulty walking, decreased ROM, decreased strength, hypomobility, increased edema, increased fascial restrictions, increased muscle spasms, impaired flexibility, and pain.   ACTIVITY LIMITATIONS: carrying, lifting, bending, sitting, standing, sleeping, stairs, transfers, and locomotion level  PARTICIPATION LIMITATIONS: meal prep, cleaning, medication management, driving, shopping, and community activity  PERSONAL FACTORS: Age and 3+ comorbidities: GERD, HTN, osteopenia  are also affecting patient's functional outcome.   REHAB POTENTIAL: Good  CLINICAL DECISION MAKING: Evolving/moderate complexity  EVALUATION COMPLEXITY: Moderate   GOALS: Goals reviewed with patient? Yes  SHORT TERM GOALS: Target Date 01/24/2023   1.  Patient will demonstrate independent use of home exercise program to maintain progress from in clinic treatments. Goal status: MET 01/15/23  2.  Patient will demonstrate Rt knee  AROM 0-60 deg to facilitate usual daily mobility, transfers, and ambulation at PLOF.  Goal status: MET 01/15/23  LONG TERM GOALS: Target Date 02/28/2023   1. Patient will demonstrate/report pain at worst less than or equal to 2/10 to facilitate minimal limitation in daily activity secondary to pain symptoms. Goal status: MET 01/25/23   2. Patient will demonstrate independent use of home exercise program to facilitate ability to maintain/progress functional gains from skilled physical therapy services. Goal status: ONGOING 02/01/23   3. Patient will  demonstrate FOTO outcome > or = 62 % to indicate reduced disability due to condition. Goal status: ONGOING 01/25/23   4.  Patient will demonstrate Rt LE MMT 4/5 throughout to faciltiate usual transfers, stairs, squatting at [facility] for daily life.  Goal status: ONGOING 01/25/23   5.  Patient will demonstrate Rt knee AROM 0-100 deg to facilitate usual daily mobility, transfers, and ambulation at PLOF.  Goal status: Partially Met (Passive) 02/01/2023   6.  Patient will demonstrate independent ambulation within community distances > 300 ft.  Goal status: ONGOING 02/01/23 (MOD I with SPC)   PLAN:  PT FREQUENCY: Daily for 2 weeks then 2-3 x/week for 2 weeks  PT DURATION: 4 weeks  PLANNED INTERVENTIONS: Therapeutic exercises, Therapeutic activity, Neuro Muscular re-education, Balance training, Gait training, Patient/Family education, Joint mobilization, Stair training, DME instructions, Dry Needling, Electrical stimulation, Traction, Cryotherapy, vasopneumatic deviceMoist heat, Taping, Ultrasound, Ionotophoresis 4mg /ml Dexamethasone, and aquatic therapy, Manual therapy.  All included unless contraindicated  PLAN FOR NEXT SESSION:  Flexion ROM focus following manipulation, progressions PRN, edema management   NEXT MD VISIT: November 6 at 9 AM   Nedra Hai, PT, DPT 02/05/23 8:43 AM

## 2023-02-06 ENCOUNTER — Ambulatory Visit: Payer: Medicare Other | Admitting: Physical Therapy

## 2023-02-06 ENCOUNTER — Encounter: Payer: Self-pay | Admitting: Physical Therapy

## 2023-02-06 DIAGNOSIS — M25661 Stiffness of right knee, not elsewhere classified: Secondary | ICD-10-CM

## 2023-02-06 DIAGNOSIS — G8929 Other chronic pain: Secondary | ICD-10-CM | POA: Diagnosis not present

## 2023-02-06 DIAGNOSIS — M25561 Pain in right knee: Secondary | ICD-10-CM | POA: Diagnosis not present

## 2023-02-06 DIAGNOSIS — M6281 Muscle weakness (generalized): Secondary | ICD-10-CM

## 2023-02-06 DIAGNOSIS — R600 Localized edema: Secondary | ICD-10-CM | POA: Diagnosis not present

## 2023-02-06 DIAGNOSIS — R262 Difficulty in walking, not elsewhere classified: Secondary | ICD-10-CM | POA: Diagnosis not present

## 2023-02-06 NOTE — Therapy (Signed)
OUTPATIENT PHYSICAL THERAPY TREATMENT NOTE/RECERT  Patient Name: Candace Bell MRN: [id number] DOB:1950/05/25, 72 y.o., female Today's Date: 02/06/2023  END OF SESSION:  PT End of Session - 02/06/23 0806     Visit Number 16    Number of Visits 23    Date for PT Re-Evaluation 02/21/23    Authorization Type [employer] Medicare $20 copay    Progress Note Due on Visit 20    PT Start Time 0800    PT Stop Time 0839    PT Time Calculation (min) 39 min    Activity Tolerance Patient tolerated treatment well;No increased pain    Behavior During Therapy [facility] for tasks assessed/performed                Past Medical History:  Diagnosis Date   Arthritis    generalized   Cataract 2015   bilateral sx   GERD (gastroesophageal reflux disease)    with certain foods   Hypertension    on meds   Osteopenia 10/13/2015   Past Surgical History:  Procedure Laterality Date   ABDOMINAL HYSTERECTOMY  04/10/1991   TAH --took one ovary   BACK SURGERY  2002   BUNIONECTOMY WITH HAMMERTOE RECONSTRUCTION Right 2014   CATARACT EXTRACTION Bilateral 04/09/2013   COLONOSCOPY  2015   MS-MAC-movi(exc)-ulcerated polyp   right wrist surgery due to fracture     TOTAL KNEE ARTHROPLASTY Right 12/14/2022   Procedure: RIGHT TOTAL KNEE ARTHROPLASTY;  Surgeon: Kathryne Hitch, MD;  Location: [facility];  Service: Orthopedics;  Laterality: Right;   Patient Active Problem List   Diagnosis Date Noted   Status post total right knee replacement 12/14/2022   Preventative health care 07/03/2021   Upper extremity weakness 11/18/2020   Hypertension    Arthritis    Other fatigue 03/07/2020   Loss of appetite 03/07/2020   SOB (shortness of breath) 03/07/2020   Depression, major, single episode, mild (HCC) 03/07/2020   Left hand pain 10/21/2018   Hyperlipidemia LDL goal <100 01/26/2018   Memory loss 04/16/2017   Osteopenia 10/13/2015   Back pain 11/25/2013   Essential hypertension 02/29/2012    PCP:  Jackelyn Poling DO  REFERRING PROVIDER: Kathryne Hitch, MD  REFERRING DIAG: M17.11 (ICD-10-CM) - Primary osteoarthritis of right knee  THERAPY DIAG:  Chronic pain of right knee  Stiffness of right knee, not elsewhere classified  Muscle weakness (generalized)  Difficulty in walking, not elsewhere classified  Localized edema  Rationale for Evaluation and Treatment: Rehabilitation  ONSET DATE:  12/14/2022 surgery date  SUBJECTIVE:   SUBJECTIVE STATEMENT: She says her knee is feeling good, she is not having much pain overall just stiffness but does feel it has improved since MUA.   PERTINENT HISTORY: GERD, HTN, osteopenia  PAIN:  NPRS scale: 2/10 today  Pain location: Rt knee Pain description: sore Aggravating factors:  nothing, just there  Relieving factors: nothing, just there   PRECAUTIONS: None  WEIGHT BEARING RESTRICTIONS: No  FALLS:  Has patient fallen in last 6 months? No  LIVING ENVIRONMENT: Lives with: lives with their daughter Lives in: House/apartment Stairs: No; has one curb step to enter Has following equipment at home: RW, [facility]  OCCUPATION: retired from [employer]  PLOF: Independent  PATIENT GOALS: get up and take care of self   OBJECTIVE:   PATIENT SURVEYS:  12/27/2022 FOTO intake: 43   predicted:  62  01/25/23: FOTO: 51  COGNITION: 12/27/2022 Overall cognitive status: WFL    SENSATION: 12/27/2022 [facility]  EDEMA:  12/27/2022 Circumferential: Knee joint line: Rt: 49.2 cm   /Lt: 41.8 cm     LOWER EXTREMITY ROM:    12/27/22: significant guarding with flexion PROM   ROM Right 12/27/2022 Rt 01/01/23 Rt 01/08/23 Rt 01/10/23 Rt 01/14/23 Rt 01/15/23 Right 01/22/23 Rt 01/23/23 Rt 01/25/23 Rt 01/31/2023 Rt 02/01/2023 Rt 02/04/23  Knee flexion A: 30 P: 38 A: 44 P: 48 A: 55 P; 60 P:70 P:78 A: 65 P: 74  supine A: 81 P: 95 A: 82 P: 90 A: 84 P: 92 A: 92 P: 96 P: 101 with PT assist Self AAROM sitting edge of table 92*, PROM by PT 96-97*  limited by pain    Knee extension A: -14 (seated LAQ) P: 0 A: -10 P: 0 A: -8 P: -2   A: -5 P: 0  A: -4 P: 0 A: -2 A: -2     (Blank rows = not tested)  LOWER EXTREMITY MMT:  Not formally measured; Rt knee grossly 3-/5  MMT Right 12/27/2022 Left 12/27/2022 Rt:  01/08/23  Hip flexion   4  Hip extension     Hip abduction     Hip adduction     Hip internal rotation     Hip external rotation     Knee flexion   3  Knee extension   3  Ankle dorsiflexion   4  Ankle plantarflexion     Ankle inversion     Ankle eversion      (Blank rows = not tested)   12/27/2022 Ambulation with RW mod I; decreased Rt hip/knee flexion, decreased stance on Rt                                                                                                                                                                        TODAY'S TREATMENT DATE 02/06/23 TherEx Scifit seat 9 --> seat 9 full revolutions x10 minutes for ROM  Standing lunge stretch for R knee flexion 15x5 second holds  Step ups leading with Rt leg up then leading down in front with left leg on 6 inch step X 10 reps with UE support Shuttle LE press 75# x15 BLEs, 37# x15 single leg   Manual Rt knee flexion PROM with overpressure X 10 min  02/05/23   TherEx  Scifit bike partial rotations progressing to full as able, seat 9 x8 minutes Knee flexion stretch on 6 inch box 15x5 second holds Standing HS curls 0# x10 cues for thigh perpendicular to floor Body weight squats/knee flexion x10 in // bars     Manual   Tennis ball massage to quad and lateral thigh sitting edge of mat table + knee flexion AAROM with OP from PT  Retrograde massage  with LEs elevated for edema management    PATIENT EDUCATION:  12/27/2022 Education details: HEP, POC Person educated: Patient Education method: Explanation, Demonstration, Verbal cues, and Handouts Education comprehension: verbalized understanding, returned demonstration, and verbal cues  required  HOME EXERCISE PROGRAM: Access Code: [email or web address] URL: [email or web address] Date: 12/27/2022 Prepared by: Moshe Cipro  Exercises - Quad Set  - 3-5 x daily - 7 x weekly - 1 sets - 10 reps - 5 sec hold - Supine Heel Slide with Strap  - 5-10 x daily - 7 x weekly - 1 sets - 5-10 reps - Seated Heel Slide  - 3-5 x daily - 7 x weekly - 1 sets - 5-10 reps - 5-10 sec hold - Seated Knee Flexion AAROM  - 3-5 x daily - 7 x weekly - 1 sets - 5-10 reps - 5-10 sec hold  ASSESSMENT:  CLINICAL IMPRESSION:  02/06/23 Recert today and new insurance authorization as she will need additional visits following her MUA. She is doing well after this procedure and her ROM is improving but she will need more PT to further improve this to improve function.     OBJECTIVE IMPAIRMENTS: Abnormal gait, decreased activity tolerance, decreased knowledge of use of DME, decreased mobility, difficulty walking, decreased ROM, decreased strength, hypomobility, increased edema, increased fascial restrictions, increased muscle spasms, impaired flexibility, and pain.   ACTIVITY LIMITATIONS: carrying, lifting, bending, sitting, standing, sleeping, stairs, transfers, and locomotion level  PARTICIPATION LIMITATIONS: meal prep, cleaning, medication management, driving, shopping, and community activity  PERSONAL FACTORS: Age and 3+ comorbidities: GERD, HTN, osteopenia  are also affecting patient's functional outcome.   REHAB POTENTIAL: Good  CLINICAL DECISION MAKING: Evolving/moderate complexity  EVALUATION COMPLEXITY: Moderate   GOALS: Goals reviewed with patient? Yes  SHORT TERM GOALS: Target Date 01/24/2023   1.  Patient will demonstrate independent use of home exercise program to maintain progress from in clinic treatments. Goal status: MET 01/15/23  2.  Patient will demonstrate Rt knee AROM 0-60 deg to facilitate usual daily mobility, transfers, and ambulation at PLOF.  Goal status: MET  01/15/23  LONG TERM GOALS: Target Date 02/28/2023   1. Patient will demonstrate/report pain at worst less than or equal to 2/10 to facilitate minimal limitation in daily activity secondary to pain symptoms. Goal status: MET 01/25/23   2. Patient will demonstrate independent use of home exercise program to facilitate ability to maintain/progress functional gains from skilled physical therapy services. Goal status: ONGOING 02/01/23   3. Patient will demonstrate FOTO outcome > or = 62 % to indicate reduced disability due to condition. Goal status: ONGOING 01/25/23   4.  Patient will demonstrate Rt LE MMT 4/5 throughout to faciltiate usual transfers, stairs, squatting at [facility] for daily life.  Goal status: ONGOING 01/25/23   5.  Patient will demonstrate Rt knee AROM 0-100 deg to facilitate usual daily mobility, transfers, and ambulation at PLOF.  Goal status: Partially Met (Passive) 02/01/2023   6.  Patient will demonstrate independent ambulation within community distances > 300 ft.  Goal status: ONGOING 02/01/23 (MOD I with SPC)   PLAN:  PT FREQUENCY: Daily for 2 weeks then 2-3 x/week for 2 weeks  PT DURATION: 4 weeks  PLANNED INTERVENTIONS: Therapeutic exercises, Therapeutic activity, Neuro Muscular re-education, Balance training, Gait training, Patient/Family education, Joint mobilization, Stair training, DME instructions, Dry Needling, Electrical stimulation, Traction, Cryotherapy, vasopneumatic deviceMoist heat, Taping, Ultrasound, Ionotophoresis 4mg /ml Dexamethasone, and aquatic therapy, Manual therapy.  All included unless contraindicated  PLAN FOR NEXT SESSION:  Flexion ROM focus following manipulation, progressions PRN  NEXT MD VISIT: November 6 at 9 AM  Ivery Quale, PT, DPT 02/06/23 8:11 AM  Date of referral: 12/12/2022 Referring provider: Kathryne Hitch, MD  Referring diagnosis?  M17.11 (ICD-10-CM) - Primary osteoarthritis of right knee  Treatment diagnosis? (if  different than referring diagnosis) m25.561  What was this (referring dx) caused by? Surgery (Type: Rt TKA 12/14/22)  Ashby Dawes of Condition: Initial Onset (within last 3 months)   Laterality: Rt  Current Functional Measure Score: FOTO FOTO intake: 43   predicted:  62, updated and improved to 51 on 01/25/23  Objective measurements identify impairments when they are compared to normal values, the uninvolved extremity, and prior level of function.  [x]  Yes  []  No  Objective assessment of functional ability: Moderate functional limitations   Briefly describe symptoms: decreased knee ROM and strength affecting ADL's such as gait, stairs, squatting.   How did symptoms start: as advanced arthritis  Average pain intensity:  Last 24 hours: 2/10  Past week: 3/10  How often does the pt experience symptoms? Occasionally  How much have the symptoms interfered with usual daily activities? Moderately  How has condition changed since care began at this facility? A little better  In general, how is the patients overall health? Good

## 2023-02-07 ENCOUNTER — Ambulatory Visit: Payer: Medicare Other | Admitting: Physical Therapy

## 2023-02-07 ENCOUNTER — Encounter: Payer: Self-pay | Admitting: Physical Therapy

## 2023-02-07 DIAGNOSIS — G8929 Other chronic pain: Secondary | ICD-10-CM

## 2023-02-07 DIAGNOSIS — M6281 Muscle weakness (generalized): Secondary | ICD-10-CM | POA: Diagnosis not present

## 2023-02-07 DIAGNOSIS — M25561 Pain in right knee: Secondary | ICD-10-CM | POA: Diagnosis not present

## 2023-02-07 DIAGNOSIS — R262 Difficulty in walking, not elsewhere classified: Secondary | ICD-10-CM

## 2023-02-07 DIAGNOSIS — M25661 Stiffness of right knee, not elsewhere classified: Secondary | ICD-10-CM

## 2023-02-07 NOTE — Therapy (Signed)
OUTPATIENT PHYSICAL THERAPY TREATMENT NOTE/RECERT  Patient Name: Candace Bell MRN: [id number] DOB:May 07, 1950, 72 y.o., female Today's Date: 02/07/2023  END OF SESSION:  PT End of Session - 02/07/23 0808     Visit Number 17    Number of Visits 23    Date for PT Re-Evaluation 02/21/23    Authorization Type [employer] Medicare $20 copay    Progress Note Due on Visit 20    PT Start Time 0802    PT Stop Time 0840    PT Time Calculation (min) 38 min    Activity Tolerance Patient tolerated treatment well;No increased pain    Behavior During Therapy [facility] for tasks assessed/performed                Past Medical History:  Diagnosis Date   Arthritis    generalized   Cataract 2015   bilateral sx   GERD (gastroesophageal reflux disease)    with certain foods   Hypertension    on meds   Osteopenia 10/13/2015   Past Surgical History:  Procedure Laterality Date   ABDOMINAL HYSTERECTOMY  04/10/1991   TAH --took one ovary   BACK SURGERY  2002   BUNIONECTOMY WITH HAMMERTOE RECONSTRUCTION Right 2014   CATARACT EXTRACTION Bilateral 04/09/2013   COLONOSCOPY  2015   MS-MAC-movi(exc)-ulcerated polyp   right wrist surgery due to fracture     TOTAL KNEE ARTHROPLASTY Right 12/14/2022   Procedure: RIGHT TOTAL KNEE ARTHROPLASTY;  Surgeon: Kathryne Hitch, MD;  Location: [facility];  Service: Orthopedics;  Laterality: Right;   Patient Active Problem List   Diagnosis Date Noted   Status post total right knee replacement 12/14/2022   Preventative health care 07/03/2021   Upper extremity weakness 11/18/2020   Hypertension    Arthritis    Other fatigue 03/07/2020   Loss of appetite 03/07/2020   SOB (shortness of breath) 03/07/2020   Depression, major, single episode, mild (HCC) 03/07/2020   Left hand pain 10/21/2018   Hyperlipidemia LDL goal <100 01/26/2018   Memory loss 04/16/2017   Osteopenia 10/13/2015   Back pain 11/25/2013   Essential hypertension 02/29/2012    PCP:  Jackelyn Poling DO  REFERRING PROVIDER: Kathryne Hitch, MD  REFERRING DIAG: M17.11 (ICD-10-CM) - Primary osteoarthritis of right knee  THERAPY DIAG:  Chronic pain of right knee  Stiffness of right knee, not elsewhere classified  Muscle weakness (generalized)  Difficulty in walking, not elsewhere classified  Rationale for Evaluation and Treatment: Rehabilitation  ONSET DATE:  12/14/2022 surgery date  SUBJECTIVE:   SUBJECTIVE STATEMENT: She says her knee is doing fine, no changes since yesterday   PERTINENT HISTORY: GERD, HTN, osteopenia  PAIN:  NPRS scale: 2/10 today  Pain location: Rt knee Pain description: sore Aggravating factors:  nothing, just there  Relieving factors: nothing, just there   PRECAUTIONS: None  WEIGHT BEARING RESTRICTIONS: No  FALLS:  Has patient fallen in last 6 months? No  LIVING ENVIRONMENT: Lives with: lives with their daughter Lives in: House/apartment Stairs: No; has one curb step to enter Has following equipment at home: RW, [facility]  OCCUPATION: retired from [employer]  PLOF: Independent  PATIENT GOALS: get up and take care of self   OBJECTIVE:   PATIENT SURVEYS:  12/27/2022 FOTO intake: 43   predicted:  62  01/25/23: FOTO: 51  COGNITION: 12/27/2022 Overall cognitive status: WFL    SENSATION: 12/27/2022 WFL  EDEMA:  12/27/2022 Circumferential: Knee joint line: Rt: 49.2 cm   /Lt: 41.8 cm  LOWER EXTREMITY ROM:    12/27/22: significant guarding with flexion PROM   ROM Right 12/27/2022 Rt 01/01/23 Rt 01/08/23 Rt 01/10/23 Rt 01/14/23 Rt 01/15/23 Right 01/22/23 Rt 01/23/23 Rt 01/25/23 Rt 01/31/2023 Rt 02/01/2023 Rt 02/04/23 Rt 02/07/23  Knee flexion A: 30 P: 38 A: 44 P: 48 A: 55 P; 60 P:70 P:78 A: 65 P: 74  supine A: 81 P: 95 A: 82 P: 90 A: 84 P: 92 A: 92 P: 96 P: 101 with PT assist Self AAROM sitting edge of table 92*, PROM by PT 96-97* limited by pain   P:100  Knee extension A: -14 (seated LAQ) P: 0 A:  -10 P: 0 A: -8 P: -2   A: -5 P: 0  A: -4 P: 0 A: -2 A: -2      (Blank rows = not tested)  LOWER EXTREMITY MMT:  Not formally measured; Rt knee grossly 3-/5  MMT Right 12/27/2022 Left 12/27/2022 Rt:  01/08/23  Hip flexion   4  Hip extension     Hip abduction     Hip adduction     Hip internal rotation     Hip external rotation     Knee flexion   3  Knee extension   3  Ankle dorsiflexion   4  Ankle plantarflexion     Ankle inversion     Ankle eversion      (Blank rows = not tested)   12/27/2022 Ambulation with RW mod I; decreased Rt hip/knee flexion, decreased stance on Rt                                                                                                                                                                        TODAY'S TREATMENT DATE 02/07/23 TherEx Scifit seat 10 full revolutions x10 minutes for ROM  Standing lunge stretch for R knee flexion 15x5 second holds  Step ups leading with Rt leg up then leading down in front with left leg on 6 inch step X 10 reps with UE support Shuttle LE press 75# x15 BLEs, 37# x15 single leg   Manual Rt knee flexion PROM with overpressure X 10 min  02/06/23 TherEx Scifit seat 9 --> seat 9 full revolutions x10 minutes for ROM  Standing lunge stretch for R knee flexion 15x5 second holds  Step ups leading with Rt leg up then leading down in front with left leg on 6 inch step X 10 reps with UE support Shuttle LE press 75# x15 BLEs, 37# x15 single leg   Manual Rt knee flexion PROM with overpressure X 10 min  02/05/23   TherEx  Scifit bike partial rotations progressing to full as able, seat 9 x8 minutes Knee flexion stretch on  6 inch box 15x5 second holds Standing HS curls 0# x10 cues for thigh perpendicular to floor Body weight squats/knee flexion x10 in // bars     Manual   Tennis ball massage to quad and lateral thigh sitting edge of mat table + knee flexion AAROM with OP from PT  Retrograde massage  with LEs elevated for edema management    PATIENT EDUCATION:  12/27/2022 Education details: HEP, POC Person educated: Patient Education method: Programmer, multimedia, Demonstration, Verbal cues, and Handouts Education comprehension: verbalized understanding, returned demonstration, and verbal cues required  HOME EXERCISE PROGRAM: Access Code: [email or web address] URL: [email or web address] Date: 12/27/2022 Prepared by: Moshe Cipro  Exercises - Quad Set  - 3-5 x daily - 7 x weekly - 1 sets - 10 reps - 5 sec hold - Supine Heel Slide with Strap  - 5-10 x daily - 7 x weekly - 1 sets - 5-10 reps - Seated Heel Slide  - 3-5 x daily - 7 x weekly - 1 sets - 5-10 reps - 5-10 sec hold - Seated Knee Flexion AAROM  - 3-5 x daily - 7 x weekly - 1 sets - 5-10 reps - 5-10 sec hold  ASSESSMENT:  CLINICAL IMPRESSION:  02/07/23 She had good tolerance to strength and ROM focus to Rt knee.  She will need more PT to further improve ROM as much as possible to improve function.     OBJECTIVE IMPAIRMENTS: Abnormal gait, decreased activity tolerance, decreased knowledge of use of DME, decreased mobility, difficulty walking, decreased ROM, decreased strength, hypomobility, increased edema, increased fascial restrictions, increased muscle spasms, impaired flexibility, and pain.   ACTIVITY LIMITATIONS: carrying, lifting, bending, sitting, standing, sleeping, stairs, transfers, and locomotion level  PARTICIPATION LIMITATIONS: meal prep, cleaning, medication management, driving, shopping, and community activity  PERSONAL FACTORS: Age and 3+ comorbidities: GERD, HTN, osteopenia  are also affecting patient's functional outcome.   REHAB POTENTIAL: Good  CLINICAL DECISION MAKING: Evolving/moderate complexity  EVALUATION COMPLEXITY: Moderate   GOALS: Goals reviewed with patient? Yes  SHORT TERM GOALS: Target Date 01/24/2023   1.  Patient will demonstrate independent use of home exercise program to  maintain progress from in clinic treatments. Goal status: MET 01/15/23  2.  Patient will demonstrate Rt knee AROM 0-60 deg to facilitate usual daily mobility, transfers, and ambulation at PLOF.  Goal status: MET 01/15/23  LONG TERM GOALS: Target Date 02/28/2023   1. Patient will demonstrate/report pain at worst less than or equal to 2/10 to facilitate minimal limitation in daily activity secondary to pain symptoms. Goal status: MET 01/25/23   2. Patient will demonstrate independent use of home exercise program to facilitate ability to maintain/progress functional gains from skilled physical therapy services. Goal status: ONGOING 02/01/23   3. Patient will demonstrate FOTO outcome > or = 62 % to indicate reduced disability due to condition. Goal status: ONGOING 01/25/23   4.  Patient will demonstrate Rt LE MMT 4/5 throughout to faciltiate usual transfers, stairs, squatting at [facility] for daily life.  Goal status: ONGOING 01/25/23   5.  Patient will demonstrate Rt knee AROM 0-100 deg to facilitate usual daily mobility, transfers, and ambulation at PLOF.  Goal status: Partially Met (Passive) 02/01/2023   6.  Patient will demonstrate independent ambulation within community distances > 300 ft.  Goal status: ONGOING 02/01/23 (MOD I with SPC)   PLAN:  PT FREQUENCY: Daily for 2 weeks then 2-3 x/week for 2 weeks  PT DURATION: 4 weeks  PLANNED INTERVENTIONS: Therapeutic exercises, Therapeutic activity,  Neuro Muscular re-education, Balance training, Gait training, Patient/Family education, Joint mobilization, Stair training, DME instructions, Dry Needling, Electrical stimulation, Traction, Cryotherapy, vasopneumatic deviceMoist heat, Taping, Ultrasound, Ionotophoresis 4mg /ml Dexamethasone, and aquatic therapy, Manual therapy.  All included unless contraindicated  PLAN FOR NEXT SESSION:  Flexion ROM focus following manipulation, progressions PRN  NEXT MD VISIT: November 6 at 9 AM  Ivery Quale,  PT, DPT 02/07/23 8:10 AM  Date of referral: 12/12/2022 Referring provider: Kathryne Hitch, MD  Referring diagnosis?  M17.11 (ICD-10-CM) - Primary osteoarthritis of right knee  Treatment diagnosis? (if different than referring diagnosis) m25.561  What was this (referring dx) caused by? Surgery (Type: Rt TKA 12/14/22)  Ashby Dawes of Condition: Initial Onset (within last 3 months)   Laterality: Rt  Current Functional Measure Score: FOTO FOTO intake: 43   predicted:  62, updated and improved to 51 on 01/25/23  Objective measurements identify impairments when they are compared to normal values, the uninvolved extremity, and prior level of function.  [x]  Yes  []  No  Objective assessment of functional ability: Moderate functional limitations   Briefly describe symptoms: decreased knee ROM and strength affecting ADL's such as gait, stairs, squatting.   How did symptoms start: as advanced arthritis  Average pain intensity:  Last 24 hours: 2/10  Past week: 3/10  How often does the pt experience symptoms? Occasionally  How much have the symptoms interfered with usual daily activities? Moderately  How has condition changed since care began at this facility? A little better  In general, how is the patients overall health? Good

## 2023-02-08 ENCOUNTER — Encounter: Payer: Self-pay | Admitting: Physical Therapy

## 2023-02-08 ENCOUNTER — Ambulatory Visit: Payer: Medicare Other | Admitting: Physical Therapy

## 2023-02-08 DIAGNOSIS — M25661 Stiffness of right knee, not elsewhere classified: Secondary | ICD-10-CM | POA: Diagnosis not present

## 2023-02-08 DIAGNOSIS — R600 Localized edema: Secondary | ICD-10-CM

## 2023-02-08 DIAGNOSIS — M25561 Pain in right knee: Secondary | ICD-10-CM | POA: Diagnosis not present

## 2023-02-08 DIAGNOSIS — M6281 Muscle weakness (generalized): Secondary | ICD-10-CM | POA: Diagnosis not present

## 2023-02-08 DIAGNOSIS — R262 Difficulty in walking, not elsewhere classified: Secondary | ICD-10-CM

## 2023-02-08 DIAGNOSIS — G8929 Other chronic pain: Secondary | ICD-10-CM | POA: Diagnosis not present

## 2023-02-08 NOTE — Therapy (Signed)
OUTPATIENT PHYSICAL THERAPY TREATMENT NOTE  Patient Name: SARIYA TRICKEY MRN: [id number] DOB:1950/09/18, 72 y.o., female Today's Date: 02/08/2023  END OF SESSION:  PT End of Session - 02/08/23 0855     Visit Number 18    Number of Visits 23    Date for PT Re-Evaluation 02/21/23    Authorization Type [employer] Medicare $20 copay    Progress Note Due on Visit 20    PT Start Time 0848    PT Stop Time 0928    PT Time Calculation (min) 40 min    Activity Tolerance Patient tolerated treatment well;No increased pain    Behavior During Therapy [facility] for tasks assessed/performed                 Past Medical History:  Diagnosis Date   Arthritis    generalized   Cataract 2015   bilateral sx   GERD (gastroesophageal reflux disease)    with certain foods   Hypertension    on meds   Osteopenia 10/13/2015   Past Surgical History:  Procedure Laterality Date   ABDOMINAL HYSTERECTOMY  04/10/1991   TAH --took one ovary   BACK SURGERY  2002   BUNIONECTOMY WITH HAMMERTOE RECONSTRUCTION Right 2014   CATARACT EXTRACTION Bilateral 04/09/2013   COLONOSCOPY  2015   MS-MAC-movi(exc)-ulcerated polyp   right wrist surgery due to fracture     TOTAL KNEE ARTHROPLASTY Right 12/14/2022   Procedure: RIGHT TOTAL KNEE ARTHROPLASTY;  Surgeon: Kathryne Hitch, MD;  Location: [facility];  Service: Orthopedics;  Laterality: Right;   Patient Active Problem List   Diagnosis Date Noted   Status post total right knee replacement 12/14/2022   Preventative health care 07/03/2021   Upper extremity weakness 11/18/2020   Hypertension    Arthritis    Other fatigue 03/07/2020   Loss of appetite 03/07/2020   SOB (shortness of breath) 03/07/2020   Depression, major, single episode, mild (HCC) 03/07/2020   Left hand pain 10/21/2018   Hyperlipidemia LDL goal <100 01/26/2018   Memory loss 04/16/2017   Osteopenia 10/13/2015   Back pain 11/25/2013   Essential hypertension 02/29/2012    PCP: Jackelyn Poling  DO  REFERRING PROVIDER: Kathryne Hitch, MD  REFERRING DIAG: M17.11 (ICD-10-CM) - Primary osteoarthritis of right knee  THERAPY DIAG:  Chronic pain of right knee  Stiffness of right knee, not elsewhere classified  Muscle weakness (generalized)  Difficulty in walking, not elsewhere classified  Localized edema  Rationale for Evaluation and Treatment: Rehabilitation  ONSET DATE:  12/14/2022 surgery date  SUBJECTIVE:   SUBJECTIVE STATEMENT:  Nothing new since last time, we got the knee to 100 yesterday  PERTINENT HISTORY: GERD, HTN, osteopenia  PAIN:  NPRS scale: 0/10 Pain location:  Pain description:  Aggravating factors:    Relieving factors:   PRECAUTIONS: None  WEIGHT BEARING RESTRICTIONS: No  FALLS:  Has patient fallen in last 6 months? No  LIVING ENVIRONMENT: Lives with: lives with their daughter Lives in: House/apartment Stairs: No; has one curb step to enter Has following equipment at home: RW, [facility]  OCCUPATION: retired from [employer]  PLOF: Independent  PATIENT GOALS: get up and take care of self   OBJECTIVE:   PATIENT SURVEYS:  12/27/2022 FOTO intake: 43   predicted:  62  01/25/23: FOTO: 51  COGNITION: 12/27/2022 Overall cognitive status: WFL    SENSATION: 12/27/2022 WFL  EDEMA:  12/27/2022 Circumferential: Knee joint line: Rt: 49.2 cm   /Lt: 41.8 cm  LOWER EXTREMITY ROM:    12/27/22: significant guarding with flexion PROM   ROM Right 12/27/2022 Rt 01/01/23 Rt 01/08/23 Rt 01/10/23 Rt 01/14/23 Rt 01/15/23 Right 01/22/23 Rt 01/23/23 Rt 01/25/23 Rt 01/31/2023 Rt 02/01/2023 Rt 02/04/23 Rt 02/07/23  Knee flexion A: 30 P: 38 A: 44 P: 48 A: 55 P; 60 P:70 P:78 A: 65 P: 74  supine A: 81 P: 95 A: 82 P: 90 A: 84 P: 92 A: 92 P: 96 P: 101 with PT assist Self AAROM sitting edge of table 92*, PROM by PT 96-97* limited by pain   P:100  Knee extension A: -14 (seated LAQ) P: 0 A: -10 P: 0 A: -8 P: -2   A: -5 P: 0  A: -4 P: 0 A:  -2 A: -2      (Blank rows = not tested)  LOWER EXTREMITY MMT:  Not formally measured; Rt knee grossly 3-/5  MMT Right 12/27/2022 Left 12/27/2022 Rt:  01/08/23  Hip flexion   4  Hip extension     Hip abduction     Hip adduction     Hip internal rotation     Hip external rotation     Knee flexion   3  Knee extension   3  Ankle dorsiflexion   4  Ankle plantarflexion     Ankle inversion     Ankle eversion      (Blank rows = not tested)   12/27/2022 Ambulation with RW mod I; decreased Rt hip/knee flexion, decreased stance on Rt                                                                                                                                                                        TODAY'S TREATMENT DATE  02/08/23  TherEx  Precor  bike seat 7 x8 minutes (scifit not available) partial rotations with cues to avoid hip hiking/keep hip down Knee flexion stretch 6 inch box 15x5 seconds Shuttle LE press BLEs 81# 1x10/1x15 , R LE 37# 2x15   Standing HS stretches 3x30 seconds R LE  Self knee flexion stretch sitting edge of table 15x5 seconds Knee flexion PROM with OP from PT x10- 98* today  LAQs 4# 10x3 second holds     02/07/23 TherEx Scifit seat 10 full revolutions x10 minutes for ROM  Standing lunge stretch for R knee flexion 15x5 second holds  Step ups leading with Rt leg up then leading down in front with left leg on 6 inch step X 10 reps with UE support Shuttle LE press 75# x15 BLEs, 37# x15 single leg   Manual Rt knee flexion PROM with overpressure X 10 min  02/06/23 TherEx Scifit seat 9 --> seat 9 full revolutions x10 minutes  for ROM  Standing lunge stretch for R knee flexion 15x5 second holds  Step ups leading with Rt leg up then leading down in front with left leg on 6 inch step X 10 reps with UE support Shuttle LE press 75# x15 BLEs, 37# x15 single leg   Manual Rt knee flexion PROM with overpressure X 10 min  02/05/23   TherEx  Scifit bike  partial rotations progressing to full as able, seat 9 x8 minutes Knee flexion stretch on 6 inch box 15x5 second holds Standing HS curls 0# x10 cues for thigh perpendicular to floor Body weight squats/knee flexion x10 in // bars     Manual   Tennis ball massage to quad and lateral thigh sitting edge of mat table + knee flexion AAROM with OP from PT  Retrograde massage with LEs elevated for edema management    PATIENT EDUCATION:  12/27/2022 Education details: HEP, POC Person educated: Patient Education method: Programmer, multimedia, Demonstration, Verbal cues, and Handouts Education comprehension: verbalized understanding, returned demonstration, and verbal cues required  HOME EXERCISE PROGRAM: Access Code: [email or web address] URL: [email or web address] Date: 12/27/2022 Prepared by: Moshe Cipro  Exercises - Quad Set  - 3-5 x daily - 7 x weekly - 1 sets - 10 reps - 5 sec hold - Supine Heel Slide with Strap  - 5-10 x daily - 7 x weekly - 1 sets - 5-10 reps - Seated Heel Slide  - 3-5 x daily - 7 x weekly - 1 sets - 5-10 reps - 5-10 sec hold - Seated Knee Flexion AAROM  - 3-5 x daily - 7 x weekly - 1 sets - 5-10 reps - 5-10 sec hold  ASSESSMENT:  CLINICAL IMPRESSION:  Pt arrived doing well today, we tried the Precor today and she had a lot more difficulty with knee flexion on this machine (as expected given different biomechanics, educated that biomechanical angles on precor make knee flexion more difficult). Otherwise continued work on knee flexion as appropriate, continues to make slow and steady progress.    OBJECTIVE IMPAIRMENTS: Abnormal gait, decreased activity tolerance, decreased knowledge of use of DME, decreased mobility, difficulty walking, decreased ROM, decreased strength, hypomobility, increased edema, increased fascial restrictions, increased muscle spasms, impaired flexibility, and pain.   ACTIVITY LIMITATIONS: carrying, lifting, bending, sitting, standing,  sleeping, stairs, transfers, and locomotion level  PARTICIPATION LIMITATIONS: meal prep, cleaning, medication management, driving, shopping, and community activity  PERSONAL FACTORS: Age and 3+ comorbidities: GERD, HTN, osteopenia  are also affecting patient's functional outcome.   REHAB POTENTIAL: Good  CLINICAL DECISION MAKING: Evolving/moderate complexity  EVALUATION COMPLEXITY: Moderate   GOALS: Goals reviewed with patient? Yes  SHORT TERM GOALS: Target Date 01/24/2023   1.  Patient will demonstrate independent use of home exercise program to maintain progress from in clinic treatments. Goal status: MET 01/15/23  2.  Patient will demonstrate Rt knee AROM 0-60 deg to facilitate usual daily mobility, transfers, and ambulation at PLOF.  Goal status: MET 01/15/23  LONG TERM GOALS: Target Date 02/28/2023   1. Patient will demonstrate/report pain at worst less than or equal to 2/10 to facilitate minimal limitation in daily activity secondary to pain symptoms. Goal status: MET 01/25/23   2. Patient will demonstrate independent use of home exercise program to facilitate ability to maintain/progress functional gains from skilled physical therapy services. Goal status: ONGOING 02/01/23   3. Patient will demonstrate FOTO outcome > or = 62 % to indicate reduced disability due to condition. Goal status: ONGOING 01/25/23  4.  Patient will demonstrate Rt LE MMT 4/5 throughout to faciltiate usual transfers, stairs, squatting at [facility] for daily life.  Goal status: ONGOING 01/25/23   5.  Patient will demonstrate Rt knee AROM 0-100 deg to facilitate usual daily mobility, transfers, and ambulation at PLOF.  Goal status: Partially Met (Passive) 02/01/2023   6.  Patient will demonstrate independent ambulation within community distances > 300 ft.  Goal status: ONGOING 02/01/23 (MOD I with SPC)   PLAN:  PT FREQUENCY: Daily for 2 weeks then 2-3 x/week for 2 weeks  PT DURATION: 4  weeks  PLANNED INTERVENTIONS: Therapeutic exercises, Therapeutic activity, Neuro Muscular re-education, Balance training, Gait training, Patient/Family education, Joint mobilization, Stair training, DME instructions, Dry Needling, Electrical stimulation, Traction, Cryotherapy, vasopneumatic deviceMoist heat, Taping, Ultrasound, Ionotophoresis 4mg /ml Dexamethasone, and aquatic therapy, Manual therapy.  All included unless contraindicated  PLAN FOR NEXT SESSION:  Flexion ROM focus following manipulation, progressions PRN. How is HEP, does she need updates?   NEXT MD VISIT: November 6 at 9 AM  Nedra Hai, PT, DPT 02/08/23 9:28 AM   Date of referral: 12/12/2022 Referring provider: Kathryne Hitch, MD  Referring diagnosis?  M17.11 (ICD-10-CM) - Primary osteoarthritis of right knee  Treatment diagnosis? (if different than referring diagnosis) m25.561  What was this (referring dx) caused by? Surgery (Type: Rt TKA 12/14/22)  Ashby Dawes of Condition: Initial Onset (within last 3 months)   Laterality: Rt  Current Functional Measure Score: FOTO FOTO intake: 43   predicted:  62, updated and improved to 51 on 01/25/23  Objective measurements identify impairments when they are compared to normal values, the uninvolved extremity, and prior level of function.  [x]  Yes  []  No  Objective assessment of functional ability: Moderate functional limitations   Briefly describe symptoms: decreased knee ROM and strength affecting ADL's such as gait, stairs, squatting.   How did symptoms start: as advanced arthritis  Average pain intensity:  Last 24 hours: 2/10  Past week: 3/10  How often does the pt experience symptoms? Occasionally  How much have the symptoms interfered with usual daily activities? Moderately  How has condition changed since care began at this facility? A little better  In general, how is the patients overall health? Good

## 2023-02-11 ENCOUNTER — Ambulatory Visit: Payer: Medicare Other | Admitting: Physical Therapy

## 2023-02-11 ENCOUNTER — Encounter: Payer: Self-pay | Admitting: Physical Therapy

## 2023-02-11 DIAGNOSIS — M25661 Stiffness of right knee, not elsewhere classified: Secondary | ICD-10-CM

## 2023-02-11 DIAGNOSIS — R600 Localized edema: Secondary | ICD-10-CM | POA: Diagnosis not present

## 2023-02-11 DIAGNOSIS — M6281 Muscle weakness (generalized): Secondary | ICD-10-CM | POA: Diagnosis not present

## 2023-02-11 DIAGNOSIS — M25561 Pain in right knee: Secondary | ICD-10-CM

## 2023-02-11 DIAGNOSIS — G8929 Other chronic pain: Secondary | ICD-10-CM | POA: Diagnosis not present

## 2023-02-11 DIAGNOSIS — R262 Difficulty in walking, not elsewhere classified: Secondary | ICD-10-CM | POA: Diagnosis not present

## 2023-02-11 NOTE — Therapy (Signed)
OUTPATIENT PHYSICAL THERAPY TREATMENT NOTE  Patient Name: Candace Bell MRN: [id number] DOB:November 04, 1950, 72 y.o., female Today's Date: 02/11/2023  END OF SESSION:  PT End of Session - 02/11/23 0854     Visit Number 19    Number of Visits 23    Date for PT Re-Evaluation 02/21/23    Authorization Type [employer] Medicare $20 copay    Progress Note Due on Visit 20    PT Start Time 0848    PT Stop Time 0930    PT Time Calculation (min) 42 min    Activity Tolerance Patient tolerated treatment well;No increased pain    Behavior During Therapy [facility] for tasks assessed/performed                  Past Medical History:  Diagnosis Date   Arthritis    generalized   Cataract 2015   bilateral sx   GERD (gastroesophageal reflux disease)    with certain foods   Hypertension    on meds   Osteopenia 10/13/2015   Past Surgical History:  Procedure Laterality Date   ABDOMINAL HYSTERECTOMY  04/10/1991   TAH --took one ovary   BACK SURGERY  2002   BUNIONECTOMY WITH HAMMERTOE RECONSTRUCTION Right 2014   CATARACT EXTRACTION Bilateral 04/09/2013   COLONOSCOPY  2015   MS-MAC-movi(exc)-ulcerated polyp   right wrist surgery due to fracture     TOTAL KNEE ARTHROPLASTY Right 12/14/2022   Procedure: RIGHT TOTAL KNEE ARTHROPLASTY;  Surgeon: Kathryne Hitch, MD;  Location: [facility];  Service: Orthopedics;  Laterality: Right;   Patient Active Problem List   Diagnosis Date Noted   Status post total right knee replacement 12/14/2022   Preventative health care 07/03/2021   Upper extremity weakness 11/18/2020   Hypertension    Arthritis    Other fatigue 03/07/2020   Loss of appetite 03/07/2020   SOB (shortness of breath) 03/07/2020   Depression, major, single episode, mild (HCC) 03/07/2020   Left hand pain 10/21/2018   Hyperlipidemia LDL goal <100 01/26/2018   Memory loss 04/16/2017   Osteopenia 10/13/2015   Back pain 11/25/2013   Essential hypertension 02/29/2012    PCP: Jackelyn Poling DO  REFERRING PROVIDER: Kathryne Hitch, MD  REFERRING DIAG: M17.11 (ICD-10-CM) - Primary osteoarthritis of right knee  THERAPY DIAG:  Chronic pain of right knee  Stiffness of right knee, not elsewhere classified  Muscle weakness (generalized)  Difficulty in walking, not elsewhere classified  Localized edema  Rationale for Evaluation and Treatment: Rehabilitation  ONSET DATE:  12/14/2022 surgery date  SUBJECTIVE:   SUBJECTIVE STATEMENT: No differences noted.  She continues to do her exercises 2-3 x/day.    PERTINENT HISTORY: Right TKA 12/14/2022 with manipulation under anesthesia 01/31/2023, GERD, HTN, osteopenia  PAIN:  NPRS scale: 0/10 Pain location:  Pain description:  Aggravating factors:    Relieving factors:   PRECAUTIONS: None  WEIGHT BEARING RESTRICTIONS: No  FALLS:  Has patient fallen in last 6 months? No  LIVING ENVIRONMENT: Lives with: lives with their daughter Lives in: House/apartment Stairs: No; has one curb step to enter Has following equipment at home: RW, [facility]  OCCUPATION: retired from [employer]  PLOF: Independent  PATIENT GOALS: get up and take care of self   OBJECTIVE:   PATIENT SURVEYS:  12/27/2022 FOTO intake: 43   predicted:  62  01/25/23: FOTO: 51  COGNITION: 12/27/2022 Overall cognitive status: WFL    SENSATION: 12/27/2022 WFL  EDEMA:  12/27/2022 Circumferential: Knee joint line: Rt: 49.2  cm   /Lt: 41.8 cm     LOWER EXTREMITY ROM:    12/27/22: significant guarding with flexion PROM   ROM Right 12/27/2022 Rt 01/01/23 Rt 01/08/23 Rt 01/10/23 Rt 01/14/23 Rt 01/15/23 Right 01/22/23 Rt 01/23/23 Rt 01/25/23 Rt 01/31/2023 Rt 02/01/2023 Rt 02/04/23 Rt 02/07/23  Knee flexion A: 30 P: 38 A: 44 P: 48 A: 55 P; 60 P:70 P:78 A: 65 P: 74  supine A: 81 P: 95 A: 82 P: 90 A: 84 P: 92 A: 92 P: 96 P: 101 with PT assist Self AAROM sitting edge of table 92*, PROM by PT 96-97* limited by pain   P:100  Knee extension A:  -14 (seated LAQ) P: 0 A: -10 P: 0 A: -8 P: -2   A: -5 P: 0  A: -4 P: 0 A: -2 A: -2      (Blank rows = not tested)  LOWER EXTREMITY MMT:  Not formally measured; Rt knee grossly 3-/5  MMT Right 12/27/2022 Left 12/27/2022 Rt:  01/08/23  Hip flexion   4  Hip extension     Hip abduction     Hip adduction     Hip internal rotation     Hip external rotation     Knee flexion   3  Knee extension   3  Ankle dorsiflexion   4  Ankle plantarflexion     Ankle inversion     Ankle eversion      (Blank rows = not tested)   12/27/2022 Ambulation with RW mod I; decreased Rt hip/knee flexion, decreased stance on Rt                                                                                                                                                                        TODAY'S TREATMENT DATE  02/11/23 Therapeutic Exercise: Shuttle LE press BLEs 87# 20 reps with 5 sec hold flexion stretch , R LE 43# 2x10 Precor  bike seat 7 x 8 minutes total - partial rotations for 1.5 min, backwards full revolutions for 2.5 min and forward full revolutions for 4 min Knee flexion stretch with RLE on chair against wall 10 seconds stretch 3 reps per foot position moving LLE stance 1" closer per set for 3 sets. PT cued as HEP with chair or foot on 2nd step of stairs. Pt verbalized understanding.  Standing HS stretches 2x30 seconds R LE  Gastroc stretch step heel depression 30 sec hold 2 reps - PT cued as HEP. Pt verbalized understanding. PT recommended during awake hours to do 1-2 exercises (rotating thru all HEP exercises) every hour to limit knee stiffening up.  Instead of doing all exercises at one time then nothing for 3-4 hours.  Pt  verbalized understanding.   Therapeutic Activities: PT demo & verbal cues on engaging RLE with sit to/from stand.  Pt able to return demo but does report probably more habit that has formed.  Upon arising weight shifts to RLE 5 reps 3-5 sec hold prior to walking.  Pt  performed after each exercise that was not weight bearing and reported less discomfort with gait.  Stairs 2 rails descend step-to pattern using RLE and ascend alternating pattern. PT demo & verbal cues on technique.   Manual Therapy: PROM with overpressure & contract relax for flexion.    TREATMENT DATE  02/08/23  TherEx  Precor  bike seat 7 x8 minutes (scifit not available) partial rotations with cues to avoid hip hiking/keep hip down Knee flexion stretch 6 inch box 15x5 seconds Shuttle LE press BLEs 81# 1x10/1x15 , R LE 37# 2x15   Standing HS stretches 3x30 seconds R LE  Self knee flexion stretch sitting edge of table 15x5 seconds Knee flexion PROM with OP from PT x10- 98* today  LAQs 4# 10x3 second holds     02/07/23 TherEx Scifit seat 10 full revolutions x10 minutes for ROM  Standing lunge stretch for R knee flexion 15x5 second holds  Step ups leading with Rt leg up then leading down in front with left leg on 6 inch step X 10 reps with UE support Shuttle LE press 75# x15 BLEs, 37# x15 single leg   Manual Rt knee flexion PROM with overpressure X 10 min  02/06/23 TherEx Scifit seat 9 --> seat 9 full revolutions x10 minutes for ROM  Standing lunge stretch for R knee flexion 15x5 second holds  Step ups leading with Rt leg up then leading down in front with left leg on 6 inch step X 10 reps with UE support Shuttle LE press 75# x15 BLEs, 37# x15 single leg   Manual Rt knee flexion PROM with overpressure X 10 min    PATIENT EDUCATION:  12/27/2022 Education details: HEP, POC Person educated: Patient Education method: Programmer, multimedia, Facilities manager, Verbal cues, and Handouts Education comprehension: verbalized understanding, returned demonstration, and verbal cues required  HOME EXERCISE PROGRAM: Access Code: [email or web address] URL: [email or web address] Date: 12/27/2022 Prepared by: Moshe Cipro  Exercises - Quad Set  - 3-5 x daily - 7 x weekly - 1 sets -  10 reps - 5 sec hold - Supine Heel Slide with Strap  - 5-10 x daily - 7 x weekly - 1 sets - 5-10 reps - Seated Heel Slide  - 3-5 x daily - 7 x weekly - 1 sets - 5-10 reps - 5-10 sec hold - Seated Knee Flexion AAROM  - 3-5 x daily - 7 x weekly - 1 sets - 5-10 reps - 5-10 sec hold  ASSESSMENT:  CLINICAL IMPRESSION: Patient improved functional knee motion during session as exercises decreased stiffness.  She appears to understand stretches performed in PT session and recommendation for 1-2 exercises per hour during awake hours. Pt continues to benefit from skilled PT.  OBJECTIVE IMPAIRMENTS: Abnormal gait, decreased activity tolerance, decreased knowledge of use of DME, decreased mobility, difficulty walking, decreased ROM, decreased strength, hypomobility, increased edema, increased fascial restrictions, increased muscle spasms, impaired flexibility, and pain.   ACTIVITY LIMITATIONS: carrying, lifting, bending, sitting, standing, sleeping, stairs, transfers, and locomotion level  PARTICIPATION LIMITATIONS: meal prep, cleaning, medication management, driving, shopping, and community activity  PERSONAL FACTORS: Age and 3+ comorbidities: GERD, HTN, osteopenia  are also affecting patient's functional outcome.   REHAB POTENTIAL: Good  CLINICAL DECISION MAKING: Evolving/moderate complexity  EVALUATION COMPLEXITY: Moderate   GOALS: Goals reviewed with patient? Yes  SHORT TERM GOALS: Target Date 01/24/2023   1.  Patient will demonstrate independent use of home exercise program to maintain progress from in clinic treatments. Goal status: MET 01/15/23  2.  Patient will demonstrate Rt knee AROM 0-60 deg to facilitate usual daily mobility, transfers, and ambulation at PLOF.  Goal status: MET 01/15/23  LONG TERM GOALS: Target Date 02/28/2023   1. Patient will demonstrate/report pain at worst less than or equal to 2/10 to facilitate minimal limitation in daily activity secondary to pain  symptoms. Goal status: MET 01/25/23   2. Patient will demonstrate independent use of home exercise program to facilitate ability to maintain/progress functional gains from skilled physical therapy services. Goal status: ONGOING 02/01/23   3. Patient will demonstrate FOTO outcome > or = 62 % to indicate reduced disability due to condition. Goal status: ONGOING 01/25/23   4.  Patient will demonstrate Rt LE MMT 4/5 throughout to faciltiate usual transfers, stairs, squatting at [facility] for daily life.  Goal status: ONGOING 01/25/23   5.  Patient will demonstrate Rt knee AROM 0-100 deg to facilitate usual daily mobility, transfers, and ambulation at PLOF.  Goal status: Partially Met (Passive) 02/01/2023   6.  Patient will demonstrate independent ambulation within community distances > 300 ft.  Goal status: ONGOING 02/01/23 (MOD I with SPC)   PLAN:  PT FREQUENCY: Daily for 2 weeks then 2-3 x/week for 2 weeks  PT DURATION: 4 weeks  PLANNED INTERVENTIONS: Therapeutic exercises, Therapeutic activity, Neuro Muscular re-education, Balance training, Gait training, Patient/Family education, Joint mobilization, Stair training, DME instructions, Dry Needling, Electrical stimulation, Traction, Cryotherapy, vasopneumatic deviceMoist heat, Taping, Ultrasound, Ionotophoresis 4mg /ml Dexamethasone, and aquatic therapy, Manual therapy.  All included unless contraindicated  PLAN FOR NEXT SESSION:   continue exercises & activities Flexion ROM focus following manipulation, progressions PRN.  Include stretches in HEP.  Note to MD prior to appt on Wednesday.   NEXT MD VISIT: November 6 at 9 AM   Vladimir Faster, PT, DPT 02/11/2023, 11:00 AM

## 2023-02-12 ENCOUNTER — Encounter: Payer: Self-pay | Admitting: Physical Therapy

## 2023-02-12 ENCOUNTER — Ambulatory Visit: Payer: Medicare Other | Admitting: Physical Therapy

## 2023-02-12 DIAGNOSIS — G8929 Other chronic pain: Secondary | ICD-10-CM

## 2023-02-12 DIAGNOSIS — R262 Difficulty in walking, not elsewhere classified: Secondary | ICD-10-CM | POA: Diagnosis not present

## 2023-02-12 DIAGNOSIS — M25561 Pain in right knee: Secondary | ICD-10-CM | POA: Diagnosis not present

## 2023-02-12 DIAGNOSIS — M25661 Stiffness of right knee, not elsewhere classified: Secondary | ICD-10-CM

## 2023-02-12 DIAGNOSIS — M6281 Muscle weakness (generalized): Secondary | ICD-10-CM | POA: Diagnosis not present

## 2023-02-12 DIAGNOSIS — R600 Localized edema: Secondary | ICD-10-CM

## 2023-02-12 NOTE — Therapy (Signed)
OUTPATIENT PHYSICAL THERAPY TREATMENT and Progress NOTE  Progress Note reporting period 01/25/23 to 02/12/23  See below for objective and subjective measurements relating to patients progress with PT.   Patient Name: Candace Bell MRN: [id number] DOB:November 23, 1950, 72 y.o., female Today's Date: 02/12/2023  END OF SESSION:  PT End of Session - 02/12/23 0832     Visit Number 20    Number of Visits 23    Date for PT Re-Evaluation 02/21/23    Authorization Type [employer] Medicare $20 copay    Progress Note Due on Visit 20    PT Start Time 0800    PT Stop Time 0838    PT Time Calculation (min) 38 min    Activity Tolerance Patient tolerated treatment well;No increased pain    Behavior During Therapy [facility] for tasks assessed/performed                   Past Medical History:  Diagnosis Date   Arthritis    generalized   Cataract 2015   bilateral sx   GERD (gastroesophageal reflux disease)    with certain foods   Hypertension    on meds   Osteopenia 10/13/2015   Past Surgical History:  Procedure Laterality Date   ABDOMINAL HYSTERECTOMY  04/10/1991   TAH --took one ovary   BACK SURGERY  2002   BUNIONECTOMY WITH HAMMERTOE RECONSTRUCTION Right 2014   CATARACT EXTRACTION Bilateral 04/09/2013   COLONOSCOPY  2015   MS-MAC-movi(exc)-ulcerated polyp   right wrist surgery due to fracture     TOTAL KNEE ARTHROPLASTY Right 12/14/2022   Procedure: RIGHT TOTAL KNEE ARTHROPLASTY;  Surgeon: Kathryne Hitch, MD;  Location: [facility];  Service: Orthopedics;  Laterality: Right;   Patient Active Problem List   Diagnosis Date Noted   Status post total right knee replacement 12/14/2022   Preventative health care 07/03/2021   Upper extremity weakness 11/18/2020   Hypertension    Arthritis    Other fatigue 03/07/2020   Loss of appetite 03/07/2020   SOB (shortness of breath) 03/07/2020   Depression, major, single episode, mild (HCC) 03/07/2020   Left hand pain 10/21/2018    Hyperlipidemia LDL goal <100 01/26/2018   Memory loss 04/16/2017   Osteopenia 10/13/2015   Back pain 11/25/2013   Essential hypertension 02/29/2012    PCP: Jackelyn Poling DO  REFERRING PROVIDER: Kathryne Hitch, MD  REFERRING DIAG: M17.11 (ICD-10-CM) - Primary osteoarthritis of right knee  THERAPY DIAG:  Chronic pain of right knee  Stiffness of right knee, not elsewhere classified  Muscle weakness (generalized)  Difficulty in walking, not elsewhere classified  Localized edema  Rationale for Evaluation and Treatment: Rehabilitation  ONSET DATE:  12/14/2022 surgery date  SUBJECTIVE:   SUBJECTIVE STATEMENT: She can tell she is improving with pain and her function. She is not really having pain anymore  PERTINENT HISTORY: Right TKA 12/14/2022 with manipulation under anesthesia 01/31/2023, GERD, HTN, osteopenia  PAIN:  NPRS scale: 0/10 Pain location:  Pain description:  Aggravating factors:    Relieving factors:   PRECAUTIONS: None  WEIGHT BEARING RESTRICTIONS: No  FALLS:  Has patient fallen in last 6 months? No  LIVING ENVIRONMENT: Lives with: lives with their daughter Lives in: House/apartment Stairs: No; has one curb step to enter Has following equipment at home: RW, [facility]  OCCUPATION: retired from [employer]  PLOF: Independent  PATIENT GOALS: get up and take care of self   OBJECTIVE:   PATIENT SURVEYS:  12/27/2022 FOTO intake: 43  predicted:  62  01/25/23: FOTO: 51  COGNITION: 12/27/2022 Overall cognitive status: WFL    SENSATION: 12/27/2022 WFL  EDEMA:  12/27/2022 Circumferential: Knee joint line: Rt: 49.2 cm   /Lt: 41.8 cm     LOWER EXTREMITY ROM:    12/27/22: significant guarding with flexion PROM   ROM Right 12/27/2022 Rt 01/01/23 Rt 01/08/23 Rt 01/10/23 Rt 01/14/23 Rt 01/15/23 Right 01/22/23 Rt 01/23/23 Rt 01/25/23 Rt 01/31/2023 Rt 02/01/2023 Rt 02/04/23 Rt 02/07/23 Rt 02/12/23  Knee flexion A: 30 P: 38 A: 44 P: 48 A: 55 P;  60 P:70 P:78 A: 65 P: 74  supine A: 81 P: 95 A: 82 P: 90 A: 84 P: 92 A: 92 P: 96 P: 101 with PT assist Self AAROM sitting edge of table 92*, PROM by PT 96-97* limited by pain   P:100 P:101  Knee extension A: -14 (seated LAQ) P: 0 A: -10 P: 0 A: -8 P: -2   A: -5 P: 0  A: -4 P: 0 A: -2 A: -2       (Blank rows = not tested)  LOWER EXTREMITY MMT:  Not formally measured; Rt knee grossly 3-/5  MMT Right 12/27/2022 Left 12/27/2022 Rt:  01/08/23 Rt 02/12/23  Hip flexion   4   Hip extension      Hip abduction      Hip adduction      Hip internal rotation      Hip external rotation      Knee flexion   3 5  Knee extension   3 5  Ankle dorsiflexion   4   Ankle plantarflexion      Ankle inversion      Ankle eversion       (Blank rows = not tested)   12/27/2022 Ambulation with RW mod I; decreased Rt hip/knee flexion, decreased stance on Rt                                                                                                                                                                        TODAY'S TREATMENT DATE 02/12/23  TherEx  Precor  bike seat 7 x10 minutes partial rotations progressed to full revolutions Knee flexion stretch 6 inch box 15x5 seconds Shuttle LE press BLEs 87# 20 reps with 5 sec hold flexion stretch , R LE 43# 2x10   Step ups onto 6 inch box with Rt leg up and Lt leg down X 15 reps Self knee flexion stretch sitting edge of table 15x5 seconds LAQs 4# 2X15 Knee flexion PROM with OP from PT   02/11/23 Therapeutic Exercise: Shuttle LE press BLEs 87# 20 reps with 5 sec hold flexion stretch , R LE 43# 2x10 Precor  bike seat  7 x 8 minutes total - partial rotations for 1.5 min, backwards full revolutions for 2.5 min and forward full revolutions for 4 min Knee flexion stretch with RLE on chair against wall 10 seconds stretch 3 reps per foot position moving LLE stance 1" closer per set for 3 sets. PT cued as HEP with chair or foot on 2nd step of stairs.  Pt verbalized understanding.  Standing HS stretches 2x30 seconds R LE  Gastroc stretch step heel depression 30 sec hold 2 reps - PT cued as HEP. Pt verbalized understanding. PT recommended during awake hours to do 1-2 exercises (rotating thru all HEP exercises) every hour to limit knee stiffening up.  Instead of doing all exercises at one time then nothing for 3-4 hours.  Pt verbalized understanding.   Therapeutic Activities: PT demo & verbal cues on engaging RLE with sit to/from stand.  Pt able to return demo but does report probably more habit that has formed.  Upon arising weight shifts to RLE 5 reps 3-5 sec hold prior to walking.  Pt performed after each exercise that was not weight bearing and reported less discomfort with gait.  Stairs 2 rails descend step-to pattern using RLE and ascend alternating pattern. PT demo & verbal cues on technique.   Manual Therapy: PROM with overpressure & contract relax for flexion.    PATIENT EDUCATION:  12/27/2022 Education details: HEP, POC Person educated: Patient Education method: Programmer, multimedia, Demonstration, Verbal cues, and Handouts Education comprehension: verbalized understanding, returned demonstration, and verbal cues required  HOME EXERCISE PROGRAM: Access Code: [email or web address] URL: [email or web address] Date: 12/27/2022 Prepared by: Moshe Cipro  Exercises - Quad Set  - 3-5 x daily - 7 x weekly - 1 sets - 10 reps - 5 sec hold - Supine Heel Slide with Strap  - 5-10 x daily - 7 x weekly - 1 sets - 5-10 reps - Seated Heel Slide  - 3-5 x daily - 7 x weekly - 1 sets - 5-10 reps - 5-10 sec hold - Seated Knee Flexion AAROM  - 3-5 x daily - 7 x weekly - 1 sets - 5-10 reps - 5-10 sec hold  ASSESSMENT:  CLINICAL IMPRESSION: Progress note shows she has made good progress since MUA. Her strength is looking good and her knee ROM continues to slowly improve and is up to 101 deg PROM. She has 3 more PT visits left and is on track to  discharge by then.   OBJECTIVE IMPAIRMENTS: Abnormal gait, decreased activity tolerance, decreased knowledge of use of DME, decreased mobility, difficulty walking, decreased ROM, decreased strength, hypomobility, increased edema, increased fascial restrictions, increased muscle spasms, impaired flexibility, and pain.   ACTIVITY LIMITATIONS: carrying, lifting, bending, sitting, standing, sleeping, stairs, transfers, and locomotion level  PARTICIPATION LIMITATIONS: meal prep, cleaning, medication management, driving, shopping, and community activity  PERSONAL FACTORS: Age and 3+ comorbidities: GERD, HTN, osteopenia  are also affecting patient's functional outcome.   REHAB POTENTIAL: Good  CLINICAL DECISION MAKING: Evolving/moderate complexity  EVALUATION COMPLEXITY: Moderate   GOALS: Goals reviewed with patient? Yes  SHORT TERM GOALS: Target Date 01/24/2023   1.  Patient will demonstrate independent use of home exercise program to maintain progress from in clinic treatments. Goal status: MET 01/15/23  2.  Patient will demonstrate Rt knee AROM 0-60 deg to facilitate usual daily mobility, transfers, and ambulation at PLOF.  Goal status: MET 01/15/23  LONG TERM GOALS: Target Date 02/28/2023   1. Patient will demonstrate/report pain at worst less than or equal  to 2/10 to facilitate minimal limitation in daily activity secondary to pain symptoms. Goal status: MET 01/25/23   2. Patient will demonstrate independent use of home exercise program to facilitate ability to maintain/progress functional gains from skilled physical therapy services. Goal status: ONGOING 02/01/23   3. Patient will demonstrate FOTO outcome > or = 62 % to indicate reduced disability due to condition. Goal status: ONGOING 01/25/23   4.  Patient will demonstrate Rt LE MMT 4/5 throughout to faciltiate usual transfers, stairs, squatting at [facility] for daily life.  Goal status: MET 02/12/23   5.  Patient will demonstrate  Rt knee AROM 0-100 deg to facilitate usual daily mobility, transfers, and ambulation at PLOF.  Goal status: Partially Met (Passive) 02/01/2023   6.  Patient will demonstrate independent ambulation within community distances > 300 ft.  Goal status: ONGOING 02/01/23 (MOD I with SPC)   PLAN:  PT FREQUENCY: Daily for 2 weeks then 2-3 x/week for 2 weeks  PT DURATION: 4 weeks  PLANNED INTERVENTIONS: Therapeutic exercises, Therapeutic activity, Neuro Muscular re-education, Balance training, Gait training, Patient/Family education, Joint mobilization, Stair training, DME instructions, Dry Needling, Electrical stimulation, Traction, Cryotherapy, vasopneumatic deviceMoist heat, Taping, Ultrasound, Ionotophoresis 4mg /ml Dexamethasone, and aquatic therapy, Manual therapy.  All included unless contraindicated  PLAN FOR NEXT SESSION:   continue exercises & activities Flexion ROM focus following manipulation, progressions PRN.       April Manson, PT, DPT 02/12/2023, 8:33 AM

## 2023-02-13 ENCOUNTER — Other Ambulatory Visit (INDEPENDENT_AMBULATORY_CARE_PROVIDER_SITE_OTHER): Payer: Medicare Other

## 2023-02-13 ENCOUNTER — Ambulatory Visit (INDEPENDENT_AMBULATORY_CARE_PROVIDER_SITE_OTHER): Payer: Medicare Other | Admitting: Orthopaedic Surgery

## 2023-02-13 ENCOUNTER — Encounter: Payer: Self-pay | Admitting: Physical Therapy

## 2023-02-13 ENCOUNTER — Encounter: Payer: Self-pay | Admitting: Orthopaedic Surgery

## 2023-02-13 ENCOUNTER — Ambulatory Visit: Payer: Medicare Other | Admitting: Physical Therapy

## 2023-02-13 DIAGNOSIS — M25661 Stiffness of right knee, not elsewhere classified: Secondary | ICD-10-CM

## 2023-02-13 DIAGNOSIS — M6281 Muscle weakness (generalized): Secondary | ICD-10-CM | POA: Diagnosis not present

## 2023-02-13 DIAGNOSIS — Z96651 Presence of right artificial knee joint: Secondary | ICD-10-CM

## 2023-02-13 DIAGNOSIS — R600 Localized edema: Secondary | ICD-10-CM | POA: Diagnosis not present

## 2023-02-13 DIAGNOSIS — R262 Difficulty in walking, not elsewhere classified: Secondary | ICD-10-CM | POA: Diagnosis not present

## 2023-02-13 DIAGNOSIS — M25561 Pain in right knee: Secondary | ICD-10-CM | POA: Diagnosis not present

## 2023-02-13 DIAGNOSIS — T8482XD Fibrosis due to internal orthopedic prosthetic devices, implants and grafts, subsequent encounter: Secondary | ICD-10-CM

## 2023-02-13 DIAGNOSIS — G8929 Other chronic pain: Secondary | ICD-10-CM | POA: Diagnosis not present

## 2023-02-13 NOTE — Therapy (Signed)
OUTPATIENT PHYSICAL THERAPY TREATMENT  . Patient Name: Candace Bell MRN: [id number] DOB:[birth year], 72 y.o., female Today's Date: 02/13/2023  END OF SESSION:  PT End of Session - 02/13/23 0838     Visit Number 21    Number of Visits 23    Date for PT Re-Evaluation 02/21/23    Authorization Type [employer] Medicare $20 copay    Progress Note Due on Visit 20    PT Start Time 0800    PT Stop Time 0838    PT Time Calculation (min) 38 min    Activity Tolerance Patient tolerated treatment well;No increased pain    Behavior During Therapy [facility] for tasks assessed/performed                   Past Medical History:  Diagnosis Date   Arthritis    generalized   Cataract 2015   bilateral sx   GERD (gastroesophageal reflux disease)    with certain foods   Hypertension    on meds   Osteopenia 10/13/2015   Past Surgical History:  Procedure Laterality Date   ABDOMINAL HYSTERECTOMY  04/10/1991   TAH --took one ovary   BACK SURGERY  2002   BUNIONECTOMY WITH HAMMERTOE RECONSTRUCTION Right 2014   CATARACT EXTRACTION Bilateral 04/09/2013   COLONOSCOPY  2015   MS-MAC-movi(exc)-ulcerated polyp   right wrist surgery due to fracture     TOTAL KNEE ARTHROPLASTY Right 12/14/2022   Procedure: RIGHT TOTAL KNEE ARTHROPLASTY;  Surgeon: Kathryne Hitch, MD;  Location: [facility];  Service: Orthopedics;  Laterality: Right;   Patient Active Problem List   Diagnosis Date Noted   Status post total right knee replacement 12/14/2022   Preventative health care 07/03/2021   Upper extremity weakness 11/18/2020   Hypertension    Arthritis    Other fatigue 03/07/2020   Loss of appetite 03/07/2020   SOB (shortness of breath) 03/07/2020   Depression, major, single episode, mild (HCC) 03/07/2020   Left hand pain 10/21/2018   Hyperlipidemia LDL goal <100 01/26/2018   Memory loss 04/16/2017   Osteopenia 10/13/2015   Back pain 11/25/2013   Essential hypertension 02/29/2012    PCP: Jackelyn Poling  DO  REFERRING PROVIDER: Kathryne Hitch, MD  REFERRING DIAG: M17.11 (ICD-10-CM) - Primary osteoarthritis of right knee  THERAPY DIAG:  Chronic pain of right knee  Stiffness of right knee, not elsewhere classified  Muscle weakness (generalized)  Difficulty in walking, not elsewhere classified  Localized edema  Rationale for Evaluation and Treatment: Rehabilitation  ONSET DATE:  12/14/2022 surgery date  SUBJECTIVE:   SUBJECTIVE STATEMENT: She can tell she is improving with pain and her function. She is not really having pain anymore  PERTINENT HISTORY: Right TKA 12/14/2022 with manipulation under anesthesia 01/31/2023, GERD, HTN, osteopenia  PAIN:  NPRS scale: 0/10 Pain location:  Pain description:  Aggravating factors:    Relieving factors:   PRECAUTIONS: None  WEIGHT BEARING RESTRICTIONS: No  FALLS:  Has patient fallen in last 6 months? No  LIVING ENVIRONMENT: Lives with: lives with their daughter Lives in: House/apartment Stairs: No; has one curb step to enter Has following equipment at home: RW, [facility]  OCCUPATION: retired from [employer]  PLOF: Independent  PATIENT GOALS: get up and take care of self   OBJECTIVE:   PATIENT SURVEYS:  12/27/2022 FOTO intake: 43   predicted:  62  01/25/23: FOTO: 51  COGNITION: 12/27/2022 Overall cognitive status: WFL    SENSATION: 12/27/2022 WFL  EDEMA:  12/27/2022 Circumferential:  Knee joint line: Rt: 49.2 cm   /Lt: 41.8 cm     LOWER EXTREMITY ROM:    12/27/22: significant guarding with flexion PROM   ROM Right 12/27/2022 Rt 01/01/23 Rt 01/08/23 Rt 01/10/23 Rt 01/14/23 Rt 01/15/23 Right 01/22/23 Rt 01/23/23 Rt 01/25/23 Rt 01/31/2023 Rt 02/01/2023 Rt 02/04/23 Rt 02/07/23 Rt 02/12/23 Rt 02/13/23  Knee flexion A: 30 P: 38 A: 44 P: 48 A: 55 P; 60 P:70 P:78 A: 65 P: 74  supine A: 81 P: 95 A: 82 P: 90 A: 84 P: 92 A: 92 P: 96 P: 101 with PT assist Self AAROM sitting edge of table 92*, PROM by PT 96-97*  limited by pain   P:100 P:101 P:103  Knee extension A: -14 (seated LAQ) P: 0 A: -10 P: 0 A: -8 P: -2   A: -5 P: 0  A: -4 P: 0 A: -2 A: -2        (Blank rows = not tested)  LOWER EXTREMITY MMT:  Not formally measured; Rt knee grossly 3-/5  MMT Right 12/27/2022 Left 12/27/2022 Rt:  01/08/23 Rt 02/12/23  Hip flexion   4   Hip extension      Hip abduction      Hip adduction      Hip internal rotation      Hip external rotation      Knee flexion   3 5  Knee extension   3 5  Ankle dorsiflexion   4   Ankle plantarflexion      Ankle inversion      Ankle eversion       (Blank rows = not tested)   12/27/2022 Ambulation with RW mod I; decreased Rt hip/knee flexion, decreased stance on Rt                                                                                                                                                                        TODAY'S TREATMENT DATE 02/13/23 Precor  bike seat 7 x10 minutes partial rotations progressed to full revolutions Knee flexion stretch 6 inch box 15x5 seconds Shuttle LE press BLEs 87# 20 reps with 5 sec hold flexion stretch , R LE 43# 2x10   Step ups onto 6 inch box with Rt leg up and Lt leg down X 15 reps Self knee flexion stretch sitting edge of table 15x5 seconds LAQs 4# 2X15  Knee flexion PROM with OP from PT   02/12/23  TherEx  Precor  bike seat 7 x10 minutes partial rotations progressed to full revolutions Knee flexion stretch 6 inch box 15x5 seconds Shuttle LE press BLEs 87# 20 reps with 5 sec hold flexion stretch , R LE 43# 2x10   Step ups onto  6 inch box with Rt leg up and Lt leg down X 15 reps Self knee flexion stretch sitting edge of table 15x5 seconds LAQs 4# 2X15 Knee flexion PROM with OP from PT     PATIENT EDUCATION:  12/27/2022 Education details: HEP, POC Person educated: Patient Education method: Programmer, multimedia, Facilities manager, Verbal cues, and Handouts Education comprehension: verbalized understanding,  returned demonstration, and verbal cues required  HOME EXERCISE PROGRAM: Access Code: [email or web address] URL: [email or web address] Date: 12/27/2022 Prepared by: Moshe Cipro  Exercises - Quad Set  - 3-5 x daily - 7 x weekly - 1 sets - 10 reps - 5 sec hold - Supine Heel Slide with Strap  - 5-10 x daily - 7 x weekly - 1 sets - 5-10 reps - Seated Heel Slide  - 3-5 x daily - 7 x weekly - 1 sets - 5-10 reps - 5-10 sec hold - Seated Knee Flexion AAROM  - 3-5 x daily - 7 x weekly - 1 sets - 5-10 reps - 5-10 sec hold  ASSESSMENT:  CLINICAL IMPRESSION: She has made good overall progress with ROM since MUA. Her knee flexion has improved to 103 deg PROM. She has 2 more visits left and we will plan to transition to independent program after that.   OBJECTIVE IMPAIRMENTS: Abnormal gait, decreased activity tolerance, decreased knowledge of use of DME, decreased mobility, difficulty walking, decreased ROM, decreased strength, hypomobility, increased edema, increased fascial restrictions, increased muscle spasms, impaired flexibility, and pain.   ACTIVITY LIMITATIONS: carrying, lifting, bending, sitting, standing, sleeping, stairs, transfers, and locomotion level  PARTICIPATION LIMITATIONS: meal prep, cleaning, medication management, driving, shopping, and community activity  PERSONAL FACTORS: Age and 3+ comorbidities: GERD, HTN, osteopenia  are also affecting patient's functional outcome.   REHAB POTENTIAL: Good  CLINICAL DECISION MAKING: Evolving/moderate complexity  EVALUATION COMPLEXITY: Moderate   GOALS: Goals reviewed with patient? Yes  SHORT TERM GOALS: Target Date 01/24/2023   1.  Patient will demonstrate independent use of home exercise program to maintain progress from in clinic treatments. Goal status: MET 01/15/23  2.  Patient will demonstrate Rt knee AROM 0-60 deg to facilitate usual daily mobility, transfers, and ambulation at PLOF.  Goal status: MET 01/15/23  LONG  TERM GOALS: Target Date 02/28/2023   1. Patient will demonstrate/report pain at worst less than or equal to 2/10 to facilitate minimal limitation in daily activity secondary to pain symptoms. Goal status: MET 01/25/23   2. Patient will demonstrate independent use of home exercise program to facilitate ability to maintain/progress functional gains from skilled physical therapy services. Goal status: ONGOING 02/01/23   3. Patient will demonstrate FOTO outcome > or = 62 % to indicate reduced disability due to condition. Goal status: ONGOING 01/25/23   4.  Patient will demonstrate Rt LE MMT 4/5 throughout to faciltiate usual transfers, stairs, squatting at [facility] for daily life.  Goal status: MET 02/12/23   5.  Patient will demonstrate Rt knee AROM 0-100 deg to facilitate usual daily mobility, transfers, and ambulation at PLOF.  Goal status: Partially Met (Passive) 02/01/2023   6.  Patient will demonstrate independent ambulation within community distances > 300 ft.  Goal status: ONGOING 02/01/23 (MOD I with SPC)   PLAN:  PT FREQUENCY: Daily for 2 weeks then 2-3 x/week for 2 weeks  PT DURATION: 4 weeks  PLANNED INTERVENTIONS: Therapeutic exercises, Therapeutic activity, Neuro Muscular re-education, Balance training, Gait training, Patient/Family education, Joint mobilization, Stair training, DME instructions, Dry Needling, Electrical stimulation, Traction, Cryotherapy, vasopneumatic deviceMoist heat,  Taping, Ultrasound, Ionotophoresis 4mg /ml Dexamethasone, and aquatic therapy, Manual therapy.  All included unless contraindicated  PLAN FOR NEXT SESSION:   2 more visits, continue exercises & activities Flexion ROM focus following manipulation, progressions PRN.       April Manson, PT, DPT 02/13/2023, 8:39 AM

## 2023-02-13 NOTE — Progress Notes (Signed)
HPI: Candace Bell returns today status post right total knee arthroplasty 12/14/2022 with subsequent right knee manipulation under anesthesia due to arthrofibrosis .  States that overall her knee pain is improved.  Her range of motion is improved she reports that she is bending down to 103 degrees.  She is taking no pain medication at this point as she states she has no pain.  Review of systems: See HPI otherwise negative or noncontributory.  Physical exam: General Well-developed well-nourished female who ambulates without any assistive device.  Right knee surgical incisions healing well no signs of infection or wound dehiscence.  Full extension flexion to 100 degrees passively.  No instability valgus varus stressing.  Calf supple nontender dorsiflexion plantarflexion right ankle intact.  Radiographs: AP and lateral views right knee: No acute fracture.  Status post right total knee arthroplasty with well-seated components no complicating features.  Knee is well located.  Impression: Status post right total knee arthroplasty Status post right knee manipulation under anesthesia  Plan: She will continue work on range of motion strengthening.  She will finish out with therapy when she has a good home exercise program.  She will follow-up with [place] in 3 months sooner if there is any questions concerns.  She also ask about her shoulder.  She has had some stiffness of her left shoulder no known injury.  She does not want any treatment or x-rays allotted today just asking what can be done.  Recommended pendulum, Codman, wall crawls and forward flexion exercises.  Questions were encouraged and answered at length by Dr. Magnus Ivan myself.

## 2023-02-14 ENCOUNTER — Ambulatory Visit: Payer: Medicare Other | Admitting: Physical Therapy

## 2023-02-14 ENCOUNTER — Encounter: Payer: Self-pay | Admitting: Physical Therapy

## 2023-02-14 DIAGNOSIS — M25561 Pain in right knee: Secondary | ICD-10-CM | POA: Diagnosis not present

## 2023-02-14 DIAGNOSIS — M25661 Stiffness of right knee, not elsewhere classified: Secondary | ICD-10-CM

## 2023-02-14 DIAGNOSIS — G8929 Other chronic pain: Secondary | ICD-10-CM | POA: Diagnosis not present

## 2023-02-14 DIAGNOSIS — R262 Difficulty in walking, not elsewhere classified: Secondary | ICD-10-CM | POA: Diagnosis not present

## 2023-02-14 DIAGNOSIS — M6281 Muscle weakness (generalized): Secondary | ICD-10-CM

## 2023-02-14 NOTE — Therapy (Signed)
OUTPATIENT PHYSICAL THERAPY TREATMENT/DISCHARGE PHYSICAL THERAPY DISCHARGE SUMMARY  Visits from Start of Care: 22  Current functional level related to goals / functional outcomes: See below   Remaining deficits: See below   Education / Equipment: HEP  Plan:  Patient goals were not met. Patient is being discharged due to meeting PT goals and was released by MD    . Patient Name: Candace Bell MRN: [id number] DOB:10/02/1950, 72 y.o., female Today's Date: 02/14/2023  END OF SESSION:  PT End of Session - 02/14/23 0803     Visit Number 22    Number of Visits 23    Date for PT Re-Evaluation 02/21/23    Authorization Type UHC Medicare $20 copay    Progress Note Due on Visit 20    PT Start Time 0800    PT Stop Time 0838    PT Time Calculation (min) 38 min    Activity Tolerance Patient tolerated treatment well;No increased pain    Behavior During Therapy [facility] for tasks assessed/performed                   Past Medical History:  Diagnosis Date   Arthritis    generalized   Cataract 2015   bilateral sx   GERD (gastroesophageal reflux disease)    with certain foods   Hypertension    on meds   Osteopenia 10/13/2015   Past Surgical History:  Procedure Laterality Date   ABDOMINAL HYSTERECTOMY  04/10/1991   TAH --took one ovary   BACK SURGERY  2002   BUNIONECTOMY WITH HAMMERTOE RECONSTRUCTION Right 2014   CATARACT EXTRACTION Bilateral 04/09/2013   COLONOSCOPY  2015   MS-MAC-movi(exc)-ulcerated polyp   right wrist surgery due to fracture     TOTAL KNEE ARTHROPLASTY Right 12/14/2022   Procedure: RIGHT TOTAL KNEE ARTHROPLASTY;  Surgeon: Kathryne Hitch, MD;  Location: [facility];  Service: Orthopedics;  Laterality: Right;   Patient Active Problem List   Diagnosis Date Noted   Status post total right knee replacement 12/14/2022   Preventative health care 07/03/2021   Upper extremity weakness 11/18/2020   Hypertension    Arthritis    Other fatigue  03/07/2020   Loss of appetite 03/07/2020   SOB (shortness of breath) 03/07/2020   Depression, major, single episode, mild (HCC) 03/07/2020   Left hand pain 10/21/2018   Hyperlipidemia LDL goal <100 01/26/2018   Memory loss 04/16/2017   Osteopenia 10/13/2015   Back pain 11/25/2013   Essential hypertension 02/29/2012    PCP: Jackelyn Poling DO  REFERRING PROVIDER: Kathryne Hitch, MD  REFERRING DIAG: M17.11 (ICD-10-CM) - Primary osteoarthritis of right knee  THERAPY DIAG:  Chronic pain of right knee  Stiffness of right knee, not elsewhere classified  Muscle weakness (generalized)  Difficulty in walking, not elsewhere classified  Rationale for Evaluation and Treatment: Rehabilitation  ONSET DATE:  12/14/2022 surgery date  SUBJECTIVE:   SUBJECTIVE STATEMENT: She says MD was really pleased and released her from PT, she feels ready to discharge today to independent program  PERTINENT HISTORY: Right TKA 12/14/2022 with manipulation under anesthesia 01/31/2023, GERD, HTN, osteopenia  PAIN:  NPRS scale: 0/10 Pain location:  Pain description:  Aggravating factors:    Relieving factors:   PRECAUTIONS: None  WEIGHT BEARING RESTRICTIONS: No  FALLS:  Has patient fallen in last 6 months? No  LIVING ENVIRONMENT: Lives with: lives with their daughter Lives in: House/apartment Stairs: No; has one curb step to enter Has following equipment at home: RW,  SPC  OCCUPATION: retired from [employer]  PLOF: Independent  PATIENT GOALS: get up and take care of self   OBJECTIVE:   PATIENT SURVEYS:  12/27/2022 FOTO intake: 43   predicted:  62  01/25/23: FOTO: 51  02/14/23: FOTO 82% and met goal  COGNITION: 12/27/2022 Overall cognitive status: WFL    SENSATION: 12/27/2022 WFL  EDEMA:  12/27/2022 Circumferential: Knee joint line: Rt: 49.2 cm   /Lt: 41.8 cm     LOWER EXTREMITY ROM:    12/27/22: significant guarding with flexion PROM   ROM Right 12/27/2022 Rt 01/01/23  Rt 01/08/23 Rt 01/10/23 Rt 01/14/23 Rt 01/15/23 Right 01/22/23 Rt 01/23/23 Rt 01/25/23 Rt 01/31/2023 Rt 02/01/2023 Rt 02/04/23 Rt 02/07/23 Rt 02/12/23 Rt 02/13/23  Knee flexion A: 30 P: 38 A: 44 P: 48 A: 55 P; 60 P:70 P:78 A: 65 P: 74  supine A: 81 P: 95 A: 82 P: 90 A: 84 P: 92 A: 92 P: 96 P: 101 with PT assist Self AAROM sitting edge of table 92*, PROM by PT 96-97* limited by pain   P:100 P:101 P:103  Knee extension A: -14 (seated LAQ) P: 0 A: -10 P: 0 A: -8 P: -2   A: -5 P: 0  A: -4 P: 0 A: -2 A: -2        (Blank rows = not tested)  LOWER EXTREMITY MMT:  Not formally measured; Rt knee grossly 3-/5  MMT Right 12/27/2022 Left 12/27/2022 Rt:  01/08/23 Rt 02/12/23  Hip flexion   4   Hip extension      Hip abduction      Hip adduction      Hip internal rotation      Hip external rotation      Knee flexion   3 5  Knee extension   3 5  Ankle dorsiflexion   4   Ankle plantarflexion      Ankle inversion      Ankle eversion       (Blank rows = not tested)   12/27/2022 Ambulation with RW mod I; decreased Rt hip/knee flexion, decreased stance on Rt                                                                                                                                                                        TODAY'S TREATMENT DATE 02/14/23 Precor  bike seat 7 x10 minutes partial rotations progressed to full revolutions Knee flexion stretch 6 inch box 15x5 seconds Shuttle LE press BLEs 87# 20 reps with 5 sec hold flexion stretch , R LE 43# 2x10   Step ups onto 6 inch box with Rt leg up and Lt leg down X 15 reps Self knee flexion stretch sitting edge  of table 15x5 seconds LAQs 4# 2X15 Sit  to stand no UE support from chair with pad, X 10 Knee flexion PROM with OP from PT    PATIENT EDUCATION:  12/27/2022 Education details: HEP, POC Person educated: Patient Education method: Programmer, multimedia, Facilities manager, Verbal cues, and Handouts Education comprehension: verbalized  understanding, returned demonstration, and verbal cues required  HOME EXERCISE PROGRAM: Access Code: [email or web address] URL: [email or web address] Date: 12/27/2022 Prepared by: Moshe Cipro  Exercises - Quad Set  - 3-5 x daily - 7 x weekly - 1 sets - 10 reps - 5 sec hold - Supine Heel Slide with Strap  - 5-10 x daily - 7 x weekly - 1 sets - 5-10 reps - Seated Heel Slide  - 3-5 x daily - 7 x weekly - 1 sets - 5-10 reps - 5-10 sec hold - Seated Knee Flexion AAROM  - 3-5 x daily - 7 x weekly - 1 sets - 5-10 reps - 5-10 sec hold  ASSESSMENT:  CLINICAL IMPRESSION: She has met PT goals and was released by MD so will be discharged from PT today. She will continue with HEP.    OBJECTIVE IMPAIRMENTS: Abnormal gait, decreased activity tolerance, decreased knowledge of use of DME, decreased mobility, difficulty walking, decreased ROM, decreased strength, hypomobility, increased edema, increased fascial restrictions, increased muscle spasms, impaired flexibility, and pain.   ACTIVITY LIMITATIONS: carrying, lifting, bending, sitting, standing, sleeping, stairs, transfers, and locomotion level  PARTICIPATION LIMITATIONS: meal prep, cleaning, medication management, driving, shopping, and community activity  PERSONAL FACTORS: Age and 3+ comorbidities: GERD, HTN, osteopenia  are also affecting patient's functional outcome.   REHAB POTENTIAL: Good  CLINICAL DECISION MAKING: Evolving/moderate complexity  EVALUATION COMPLEXITY: Moderate   GOALS: Goals reviewed with patient? Yes  SHORT TERM GOALS: Target Date 01/24/2023   1.  Patient will demonstrate independent use of home exercise program to maintain progress from in clinic treatments. Goal status: MET 01/15/23  2.  Patient will demonstrate Rt knee AROM 0-60 deg to facilitate usual daily mobility, transfers, and ambulation at PLOF.  Goal status: MET 01/15/23  LONG TERM GOALS: Target Date 02/28/2023   1. Patient will  demonstrate/report pain at worst less than or equal to 2/10 to facilitate minimal limitation in daily activity secondary to pain symptoms. Goal status: MET 01/25/23   2. Patient will demonstrate independent use of home exercise program to facilitate ability to maintain/progress functional gains from skilled physical therapy services. Goal status: MET 02/14/23   3. Patient will demonstrate FOTO outcome > or = 62 % to indicate reduced disability due to condition. Goal status: MET 02/14/23, improved to 82%   4.  Patient will demonstrate Rt LE MMT 4/5 throughout to faciltiate usual transfers, stairs, squatting at [facility] for daily life.  Goal status: MET 02/12/23   5.  Patient will demonstrate Rt knee AROM 0-100 deg to facilitate usual daily mobility, transfers, and ambulation at PLOF.  Goal status: Partially Met (Passive) 02/01/2023   6.  Patient will demonstrate independent ambulation within community distances > 300 ft.  Goal status: MET 02/14/23   PLAN:  PT FREQUENCY: Daily for 2 weeks then 2-3 x/week for 2 weeks  PT DURATION: 4 weeks  PLANNED INTERVENTIONS: Therapeutic exercises, Therapeutic activity, Neuro Muscular re-education, Balance training, Gait training, Patient/Family education, Joint mobilization, Stair training, DME instructions, Dry Needling, Electrical stimulation, Traction, Cryotherapy, vasopneumatic deviceMoist heat, Taping, Ultrasound, Ionotophoresis 4mg /ml Dexamethasone, and aquatic therapy, Manual therapy.  All included unless contraindicated  PLAN FOR NEXT SESSION:  DC today   April Manson, PT, DPT 02/14/2023, 8:05 AM

## 2023-02-15 ENCOUNTER — Encounter: Payer: Medicare Other | Admitting: Rehabilitative and Restorative Service Providers"

## 2023-03-14 ENCOUNTER — Telehealth: Payer: Self-pay | Admitting: *Deleted

## 2023-03-14 NOTE — Telephone Encounter (Signed)
90 day call to patient who is s/p Right TKA with Dr. Magnus Ivan. She did have to undergo a MUA to break up scar tissue, but did very well after this procedure. She reports being very pleased with her progress and is doing great with no pain. Slight pain with colder weather this week. Otherwise, no needs. Reminded to call with needs or questions. No further CM needs.

## 2023-05-17 DIAGNOSIS — R42 Dizziness and giddiness: Secondary | ICD-10-CM | POA: Diagnosis not present

## 2023-05-17 DIAGNOSIS — R0602 Shortness of breath: Secondary | ICD-10-CM | POA: Diagnosis not present

## 2023-05-17 DIAGNOSIS — R55 Syncope and collapse: Secondary | ICD-10-CM | POA: Diagnosis not present

## 2023-05-20 ENCOUNTER — Encounter (HOSPITAL_BASED_OUTPATIENT_CLINIC_OR_DEPARTMENT_OTHER): Payer: Self-pay | Admitting: Emergency Medicine

## 2023-05-20 ENCOUNTER — Emergency Department (HOSPITAL_BASED_OUTPATIENT_CLINIC_OR_DEPARTMENT_OTHER)
Admission: EM | Admit: 2023-05-20 | Discharge: 2023-05-20 | Disposition: A | Discharge status: Home / Self Care | Payer: Medicare Other | Attending: Emergency Medicine | Admitting: Emergency Medicine

## 2023-05-20 ENCOUNTER — Other Ambulatory Visit: Payer: Self-pay

## 2023-05-20 ENCOUNTER — Emergency Department (HOSPITAL_BASED_OUTPATIENT_CLINIC_OR_DEPARTMENT_OTHER): Payer: Medicare Other

## 2023-05-20 ENCOUNTER — Emergency Department (HOSPITAL_BASED_OUTPATIENT_CLINIC_OR_DEPARTMENT_OTHER): Payer: Medicare Other | Admitting: Radiology

## 2023-05-20 DIAGNOSIS — R7989 Other specified abnormal findings of blood chemistry: Secondary | ICD-10-CM | POA: Diagnosis not present

## 2023-05-20 DIAGNOSIS — Z79899 Other long term (current) drug therapy: Secondary | ICD-10-CM | POA: Insufficient documentation

## 2023-05-20 DIAGNOSIS — I10 Essential (primary) hypertension: Secondary | ICD-10-CM | POA: Diagnosis not present

## 2023-05-20 DIAGNOSIS — R42 Dizziness and giddiness: Secondary | ICD-10-CM | POA: Diagnosis not present

## 2023-05-20 DIAGNOSIS — D72819 Decreased white blood cell count, unspecified: Secondary | ICD-10-CM | POA: Insufficient documentation

## 2023-05-20 DIAGNOSIS — Z7982 Long term (current) use of aspirin: Secondary | ICD-10-CM | POA: Insufficient documentation

## 2023-05-20 DIAGNOSIS — M79602 Pain in left arm: Secondary | ICD-10-CM | POA: Insufficient documentation

## 2023-05-20 DIAGNOSIS — E860 Dehydration: Secondary | ICD-10-CM | POA: Insufficient documentation

## 2023-05-20 DIAGNOSIS — R001 Bradycardia, unspecified: Secondary | ICD-10-CM | POA: Diagnosis not present

## 2023-05-20 DIAGNOSIS — R0602 Shortness of breath: Secondary | ICD-10-CM | POA: Diagnosis not present

## 2023-05-20 LAB — CBC
HCT: 38.9 % (ref 36.0–46.0)
Hemoglobin: 12.7 g/dL (ref 12.0–15.0)
MCH: 27.9 pg (ref 26.0–34.0)
MCHC: 32.6 g/dL (ref 30.0–36.0)
MCV: 85.5 fL (ref 80.0–100.0)
Platelets: 212 10*3/uL (ref 150–400)
RBC: 4.55 MIL/uL (ref 3.87–5.11)
RDW: 14.5 % (ref 11.5–15.5)
WBC: 3.8 10*3/uL — ABNORMAL LOW (ref 4.0–10.5)
nRBC: 0 % (ref 0.0–0.2)

## 2023-05-20 LAB — BASIC METABOLIC PANEL
Anion gap: 9 (ref 5–15)
BUN: 20 mg/dL (ref 8–23)
CO2: 27 mmol/L (ref 22–32)
Calcium: 10 mg/dL (ref 8.9–10.3)
Chloride: 103 mmol/L (ref 98–111)
Creatinine, Ser: 0.77 mg/dL (ref 0.44–1.00)
GFR, Estimated: >60 mL/min (ref >60)
Glucose, Bld: 94 mg/dL (ref 70–99)
Potassium: 3.6 mmol/L (ref 3.5–5.1)
Sodium: 139 mmol/L (ref 135–145)

## 2023-05-20 LAB — RESP PANEL BY RT-PCR (RSV, FLU A&B, COVID)  RVPGX2
Influenza A by PCR: NEGATIVE
Influenza B by PCR: NEGATIVE
Resp Syncytial Virus by PCR: NEGATIVE
SARS Coronavirus 2 by RT PCR: NEGATIVE

## 2023-05-20 LAB — TROPONIN I (HIGH SENSITIVITY)
Troponin I (High Sensitivity): 6 ng/L (ref <18)
Troponin I (High Sensitivity): 7 ng/L (ref <18)

## 2023-05-20 LAB — D-DIMER, QUANTITATIVE: D-Dimer, Quant: 2.54 ug{FEU}/mL — ABNORMAL HIGH (ref 0.00–0.50)

## 2023-05-20 MED ORDER — SODIUM CHLORIDE 0.9 % IV BOLUS
1000.0000 mL | Freq: Once | INTRAVENOUS | Status: AC
  Administered 2023-05-20: 1000 mL via INTRAVENOUS

## 2023-05-20 MED ORDER — IOHEXOL 350 MG/ML SOLN
100.0000 mL | Freq: Once | INTRAVENOUS | Status: AC | PRN
  Administered 2023-05-20: 75 mL via INTRAVENOUS

## 2023-05-20 NOTE — ED Provider Notes (Signed)
[place] EMERGENCY DEPARTMENT AT [facility] Provider Note   CSN: [id number] Arrival date & time: 05/20/23  1233     History  Chief Complaint  Patient presents with   Shortness of Breath    Candace Bell is a 73 y.o. female presents today for intermittent shortness of breath, dizziness, lightheadedness, and weakness since Thursday.  Patient also endorses left arm pain which she attributes to arthritis.  Patient was seen at urgent care and PCP who suggested a cardiac workup in the ED.  Patient denies chest pain, cough, fever, chills, nausea, vomiting.  Patient denies past history of blood clot or blood thinner use.  Patient denies any symptoms currently.   Shortness of Breath      Home Medications Prior to Admission medications   Medication Sig Start Date End Date Taking? Authorizing Provider  acetaminophen (TYLENOL) 500 MG tablet Take 1,000 mg by mouth every 6 (six) hours as needed for moderate pain.    [provider]  aspirin 81 MG chewable tablet Chew 1 tablet (81 mg total) by mouth 2 (two) times daily. 12/17/22   Kathryne Hitch, MD  Calcium Carbonate-Vit D-Min (CALCIUM 1200 PO) Take 1 tablet by mouth daily.    [provider]  celecoxib (CELEBREX) 200 MG capsule Take 1 capsule (200 mg total) by mouth 2 (two) times daily. 12/27/22   Kathryne Hitch, MD  Cholecalciferol (VITAMIN D3 PO) Take 1 tablet by mouth daily.    [provider]  pantoprazole (PROTONIX) 40 MG tablet Take 1 tablet (40 mg total) by mouth daily as needed (acid reflux). 12/17/22   Donato Schultz, DO  valsartan-hydrochlorothiazide (DIOVAN-HCT) 160-25 MG tablet TAKE 1 TABLET BY MOUTH EVERY DAY 09/14/22   Donato Schultz, DO      Allergies    Amoxicillin, Other, and Penicillins    Review of Systems   Review of Systems  Respiratory:  Positive for shortness of breath.   Neurological:  Positive for dizziness, weakness and light-headedness.     Physical Exam Updated Vital Signs BP (!) 164/83   Pulse 72   Temp 98 F (36.7 C) (Oral)   Resp (!) 21   Ht 5\' 4"  (1.626 m)   Wt 73 kg   SpO2 99%   BMI 27.64 kg/m  Physical Exam Vitals and nursing note reviewed.  Constitutional:      General: She is not in acute distress.    Appearance: She is well-developed. She is not ill-appearing, toxic-appearing or diaphoretic.  HENT:     Head: Normocephalic and atraumatic.  Eyes:     Extraocular Movements: Extraocular movements intact.     Conjunctiva/sclera: Conjunctivae normal.  Cardiovascular:     Rate and Rhythm: Normal rate and regular rhythm.     Pulses: Normal pulses.     Heart sounds: Normal heart sounds. No murmur heard. Pulmonary:     Effort: Pulmonary effort is normal. No tachypnea or respiratory distress.     Breath sounds: Normal breath sounds.  Abdominal:     Palpations: Abdomen is soft.     Tenderness: There is no abdominal tenderness.  Musculoskeletal:        General: No swelling.     Cervical back: Neck supple.     Right lower leg: No tenderness.     Left lower leg: No tenderness.     Comments: Mild, nonpitting edema in bilateral lower extremities, no calf tenderness  Skin:    General: Skin is warm  and dry.     Capillary Refill: Capillary refill takes less than 2 seconds.  Neurological:     General: No focal deficit present.     Mental Status: She is alert.     Cranial Nerves: No cranial nerve deficit.     Motor: No weakness.     Comments: Equal bilateral grip strength and resisted dorsi/plantarflexion of bilateral feet.  Patient able to straight leg lift bilateral legs without difficulty.  Psychiatric:        Mood and Affect: Mood normal. Mood is not anxious.     ED Results / Procedures / Treatments   Labs (all labs ordered are listed, but only abnormal results are displayed) Labs Reviewed  CBC - Abnormal; Notable for the following components:      Result Value   WBC 3.8 (*)    All other  components within normal limits  D-DIMER, QUANTITATIVE - Abnormal; Notable for the following components:   D-Dimer, Quant 2.54 (*)    All other components within normal limits  RESP PANEL BY RT-PCR (RSV, FLU A&B, COVID)  RVPGX2  BASIC METABOLIC PANEL  TROPONIN I (HIGH SENSITIVITY)  TROPONIN I (HIGH SENSITIVITY)    EKG None  Radiology CT Angio Chest PE W and/or Wo Contrast Result Date: 05/20/2023 CLINICAL DATA:  Pulmonary embolism suspected. Abnormal D-dimer. Intermittent shortness of breath. EXAM: CT ANGIOGRAPHY CHEST WITH CONTRAST TECHNIQUE: Multidetector CT imaging of the chest was performed using the standard protocol during bolus administration of intravenous contrast. Multiplanar CT image reconstructions and MIPs were obtained to evaluate the vascular anatomy. RADIATION DOSE REDUCTION: This exam was performed according to the departmental dose-optimization program which includes automated exposure control, adjustment of the mA and/or kV according to patient size and/or use of iterative reconstruction technique. CONTRAST:  75mL OMNIPAQUE IOHEXOL 350 MG/ML SOLN COMPARISON:  Chest radiography same day.  CT 03/24/2020 FINDINGS: Cardiovascular: Heart size upper limits of normal. No pericardial fluid. No visible coronary artery calcification or significant aortic atherosclerotic calcification. Pulmonary arterial opacification is good. There are no pulmonary emboli. Mediastinum/Nodes: No mass or adenopathy. Lungs/Pleura: No pleural effusion. No infiltrate or collapse. No mass or nodule. Upper Abdomen: Negative Musculoskeletal: No significant finding. Chronic thoracic degenerative changes. Review of the MIP images confirms the above findings. IMPRESSION: 1. No pulmonary emboli or other acute chest finding. Heart size upper limits of normal. 2. Chronic thoracic degenerative changes. Electronically Signed   By: Paulina Fusi M.D.   On: 05/20/2023 18:07   DG Chest 2 View Result Date: 05/20/2023 CLINICAL  DATA:  Shortness of breath. EXAM: CHEST - 2 VIEW COMPARISON:  03/07/2020. FINDINGS: Bilateral lung fields are clear. Bilateral costophrenic angles are clear. Normal cardio-mediastinal silhouette. No acute osseous abnormalities. Marked degenerative changes of left glenohumeral joint and at least moderate degenerative changes of right glenohumeral joint. The soft tissues are within normal limits. IMPRESSION: No active cardiopulmonary disease. Electronically Signed   By: Jules Schick M.D.   On: 05/20/2023 15:17    Procedures Procedures    Medications Ordered in ED Medications  iohexol (OMNIPAQUE) 350 MG/ML injection 100 mL (75 mLs Intravenous Contrast Given 05/20/23 1641)  sodium chloride 0.9 % bolus 1,000 mL (0 mLs Intravenous Stopped 05/20/23 2013)    ED Course/ Medical Decision Making/ A&P                                 Medical Decision Making Amount and/or Complexity of  Data Reviewed Labs: ordered. Radiology: ordered.  Risk Prescription drug management.   This patient presents to the ED with chief complaint(s) of shortness of breath with pertinent past medical history of shortness of breath, hypertension, and fatigue which further complicates the presenting complaint. The complaint involves an extensive differential diagnosis and also carries with it a high risk of complications and morbidity.    The differential diagnosis includes ACS, PE, flu, RSV, COVID, pneumonia, shortness of breath, fatigue  Additional history obtained: Additional history obtained from significant other Records reviewed Care Everywhere/External Records  ED Course and Reassessment: Orthostatic vital signs Orthostatic Lying BP- Lying: 137/83 Pulse- Lying: 55 Orthostatic Sitting BP- Sitting: 147/97 (!) Pulse- Sitting: 76 Orthostatic Standing at 0 minutes BP- Standing at 0 minutes: 145/98 (!) Pulse- Standing at 0 minutes: 86 Orthostatic Standing at 3 minutes BP- Standing at 3 minutes: 146/103 (!) Pulse-  Standing at 3 minutes: 93  And orthostatic positive, likely due to dehydration and given 1 L NS  Repeat Orthostatics: Orthostatic Lying BP- Lying: 149/97 (!) Pulse- Lying: 53 Orthostatic Sitting BP- Sitting: 159/82 Pulse- Sitting: 47 (!) Orthostatic Standing at 0 minutes BP- Standing at 0 minutes: 165/105 (!) Pulse- Standing at 0 minutes: 85 Orthostatic Standing at 3 minutes BP- Standing at 3 minutes: 198/112 (!) Pulse- Standing at 3 minutes: 97   Patient still orthostatic after 1 L bolus.  I discussed with the patient and her family that she was still orthostatic positive and this was likely due to dehydration since she has had a poor appetite and has not been drinking for approximately 1 week now.  I discussed that she can stay in the ED for more fluid resuscitation or if she felt comfortable could rehydrate orally well at home.  Patient and patient's family agreed that since she lives with the patient's daughter she would be safe to be discharged and orally rehydrate at home.  Patient able to ambulate without difficulty prior to discharge.  Patient able to tolerate p.o. intake prior to discharge.  Discussed with patient and patient's family that if her symptoms persist or get worse to return to the ED.  Patient is to follow-up with her primary care physician this coming Monday.  Independent labs interpretation:  The following labs were independently interpreted:  EKG: Sinus bradycardia BMP: No notable findings CBC: Leukopenia at 3.8 Troponin: 6, 7 Respiratory panel: Negative D-dimer: 2.54  Independent visualization of imaging: - I independently visualized the following imaging with scope of interpretation limited to determining acute life threatening conditions related to emergency care: Chest x-ray, which revealed no active cardiopulmonary disease. CTA PE: No pulmonary emboli or other acute chest findings.  Chronic thoracic degenerative changes  Consultation: - Consulted or discussed  management/test interpretation w/ external professional: None  Consideration for admission or further workup: Considered for admission or further workup however using shared decision making, patient will be discharged.  I discussed with the patient to push oral rehydration in the upcoming days and to get up slowly as to not get dizzy and fall.  Patient lives at home with her daughter who is present at bedside and is agreeable and feels safe to watch patient in the upcoming days.  Patient should follow-up with her primary care physician this coming Monday.         Final Clinical Impression(s) / ED Diagnoses Final diagnoses:  Orthostatic dizziness  Dehydration    Rx / DC Orders ED Discharge Orders     None  Dolphus Jenny, PA-C 05/20/23 2216    Franne Forts, DO 05/25/23 [phone]

## 2023-05-20 NOTE — Discharge Instructions (Signed)
 Today you were seen for orthostatic dizziness and dehydration.  Please push oral rehydration in the upcoming days.  Please follow-up with your primary care soon as possible.  Please return to the ED if your symptoms persist or worsen for further evaluation and treatment.  Thank you for letting us  treat you today. After reviewing your labs and imaging, I feel you are safe to go home. Please follow up with your PCP in the next several days and provide them with your records from this visit. Return to the Emergency Room if pain becomes severe or symptoms worsen.

## 2023-05-20 NOTE — ED Triage Notes (Signed)
 Intermittent sob, dizziness, lightheadedness and weakness Some left arm pain Started Thursday Seen at [facility] and by PCP suggest cardio w/u Denies chest pain,  No cough

## 2023-05-28 DIAGNOSIS — R42 Dizziness and giddiness: Secondary | ICD-10-CM | POA: Diagnosis not present

## 2023-05-28 DIAGNOSIS — M25512 Pain in left shoulder: Secondary | ICD-10-CM | POA: Diagnosis not present

## 2023-05-28 DIAGNOSIS — G8929 Other chronic pain: Secondary | ICD-10-CM | POA: Diagnosis not present

## 2023-07-17 ENCOUNTER — Encounter: Payer: Self-pay | Admitting: Podiatry

## 2023-07-17 ENCOUNTER — Ambulatory Visit: Admitting: Podiatry

## 2023-07-17 DIAGNOSIS — B351 Tinea unguium: Secondary | ICD-10-CM | POA: Diagnosis not present

## 2023-07-17 DIAGNOSIS — M79675 Pain in left toe(s): Secondary | ICD-10-CM

## 2023-07-17 DIAGNOSIS — M79674 Pain in right toe(s): Secondary | ICD-10-CM | POA: Diagnosis not present

## 2023-07-17 NOTE — Progress Notes (Signed)

## 2023-07-31 DIAGNOSIS — D649 Anemia, unspecified: Secondary | ICD-10-CM | POA: Diagnosis not present

## 2023-07-31 DIAGNOSIS — E559 Vitamin D deficiency, unspecified: Secondary | ICD-10-CM | POA: Diagnosis not present

## 2023-07-31 DIAGNOSIS — I10 Essential (primary) hypertension: Secondary | ICD-10-CM | POA: Diagnosis not present

## 2023-10-16 ENCOUNTER — Ambulatory Visit: Admitting: Podiatry

## 2024-02-09 ENCOUNTER — Other Ambulatory Visit (HOSPITAL_BASED_OUTPATIENT_CLINIC_OR_DEPARTMENT_OTHER): Payer: Self-pay | Admitting: Family Medicine

## 2024-02-09 DIAGNOSIS — E2839 Other primary ovarian failure: Secondary | ICD-10-CM

## 2024-02-10 ENCOUNTER — Encounter: Payer: Self-pay | Admitting: Radiology
